# Patient Record
Sex: Female | Born: 1975 | Race: Black or African American | Hispanic: No | Marital: Single | Smoking: Former smoker
Health system: Southern US, Community
[De-identification: ages and names within clinical notes are randomized; demographics above are authoritative.]

## PROBLEM LIST (undated history)

## (undated) DIAGNOSIS — K219 Gastro-esophageal reflux disease without esophagitis: Secondary | ICD-10-CM

## (undated) DIAGNOSIS — D259 Leiomyoma of uterus, unspecified: Secondary | ICD-10-CM

## (undated) HISTORY — PX: NO PAST SURGERIES: SHX2092

---

## 2001-03-07 ENCOUNTER — Encounter: Payer: Self-pay | Admitting: Family Medicine

## 2001-03-07 ENCOUNTER — Encounter: Admission: RE | Admit: 2001-03-07 | Discharge: 2001-03-07 | Payer: Self-pay | Admitting: Family Medicine

## 2005-07-19 ENCOUNTER — Emergency Department (HOSPITAL_COMMUNITY): Admission: EM | Admit: 2005-07-19 | Discharge: 2005-07-19 | Payer: Self-pay | Admitting: Emergency Medicine

## 2005-08-21 ENCOUNTER — Emergency Department (HOSPITAL_COMMUNITY): Admission: EM | Admit: 2005-08-21 | Discharge: 2005-08-21 | Payer: Self-pay | Admitting: Emergency Medicine

## 2010-12-28 ENCOUNTER — Ambulatory Visit
Admission: RE | Admit: 2010-12-28 | Discharge: 2010-12-28 | Disposition: A | Discharge status: Home / Self Care | Payer: Medicaid Other | Source: Ambulatory Visit | Attending: Nephrology | Admitting: Nephrology

## 2010-12-28 ENCOUNTER — Other Ambulatory Visit: Payer: Self-pay | Admitting: Nephrology

## 2010-12-28 DIAGNOSIS — F17200 Nicotine dependence, unspecified, uncomplicated: Secondary | ICD-10-CM

## 2010-12-31 ENCOUNTER — Other Ambulatory Visit (HOSPITAL_COMMUNITY): Payer: Self-pay | Admitting: Nephrology

## 2010-12-31 DIAGNOSIS — R51 Headache: Secondary | ICD-10-CM

## 2011-01-05 ENCOUNTER — Inpatient Hospital Stay (HOSPITAL_COMMUNITY): Admission: RE | Admit: 2011-01-05 | Payer: Medicaid Other | Source: Ambulatory Visit

## 2011-01-07 ENCOUNTER — Ambulatory Visit (HOSPITAL_COMMUNITY)
Admission: RE | Admit: 2011-01-07 | Discharge: 2011-01-07 | Disposition: A | Discharge status: Home / Self Care | Payer: Medicaid Other | Source: Ambulatory Visit | Attending: Nephrology | Admitting: Nephrology

## 2011-01-07 ENCOUNTER — Other Ambulatory Visit (HOSPITAL_COMMUNITY): Payer: Self-pay | Admitting: Nephrology

## 2011-01-07 DIAGNOSIS — F17200 Nicotine dependence, unspecified, uncomplicated: Secondary | ICD-10-CM | POA: Insufficient documentation

## 2011-01-07 DIAGNOSIS — R51 Headache: Secondary | ICD-10-CM

## 2011-05-04 HISTORY — PX: ABDOMINAL HYSTERECTOMY: SHX81

## 2011-09-21 ENCOUNTER — Other Ambulatory Visit: Payer: Self-pay | Admitting: Family Medicine

## 2011-09-21 DIAGNOSIS — Z803 Family history of malignant neoplasm of breast: Secondary | ICD-10-CM

## 2011-09-21 DIAGNOSIS — Z1231 Encounter for screening mammogram for malignant neoplasm of breast: Secondary | ICD-10-CM

## 2011-10-07 ENCOUNTER — Ambulatory Visit: Payer: Medicaid Other

## 2011-12-09 ENCOUNTER — Encounter (HOSPITAL_COMMUNITY): Payer: Self-pay

## 2011-12-09 ENCOUNTER — Emergency Department (HOSPITAL_COMMUNITY): Payer: Medicaid Other

## 2011-12-09 ENCOUNTER — Emergency Department (HOSPITAL_COMMUNITY)
Admission: EM | Admit: 2011-12-09 | Discharge: 2011-12-09 | Disposition: A | Discharge status: Home / Self Care | Payer: Medicaid Other | Attending: Emergency Medicine | Admitting: Emergency Medicine

## 2011-12-09 DIAGNOSIS — F17200 Nicotine dependence, unspecified, uncomplicated: Secondary | ICD-10-CM | POA: Insufficient documentation

## 2011-12-09 DIAGNOSIS — R109 Unspecified abdominal pain: Secondary | ICD-10-CM | POA: Insufficient documentation

## 2011-12-09 DIAGNOSIS — D251 Intramural leiomyoma of uterus: Secondary | ICD-10-CM

## 2011-12-09 LAB — CBC WITH DIFFERENTIAL/PLATELET
Basophils Absolute: 0 10*3/uL (ref 0.0–0.1)
Basophils Relative: 0 % (ref 0–1)
Eosinophils Absolute: 0.1 10*3/uL (ref 0.0–0.7)
Eosinophils Relative: 0 % (ref 0–5)
HCT: 37.4 % (ref 36.0–46.0)
Hemoglobin: 12.6 g/dL (ref 12.0–15.0)
Lymphocytes Relative: 16 % (ref 12–46)
Lymphs Abs: 2.9 10*3/uL (ref 0.7–4.0)
MCH: 26.3 pg (ref 26.0–34.0)
MCHC: 33.7 g/dL (ref 30.0–36.0)
MCV: 77.9 fL — ABNORMAL LOW (ref 78.0–100.0)
Monocytes Absolute: 1.1 10*3/uL — ABNORMAL HIGH (ref 0.1–1.0)
Monocytes Relative: 6 % (ref 3–12)
Neutro Abs: 14.2 10*3/uL — ABNORMAL HIGH (ref 1.7–7.7)
Neutrophils Relative %: 78 % — ABNORMAL HIGH (ref 43–77)
Platelets: 344 10*3/uL (ref 150–400)
RBC: 4.8 MIL/uL (ref 3.87–5.11)
RDW: 13.8 % (ref 11.5–15.5)
WBC: 18.2 10*3/uL — ABNORMAL HIGH (ref 4.0–10.5)

## 2011-12-09 LAB — COMPREHENSIVE METABOLIC PANEL
ALT: 18 U/L (ref 0–35)
AST: 18 U/L (ref 0–37)
Albumin: 3.2 g/dL — ABNORMAL LOW (ref 3.5–5.2)
Alkaline Phosphatase: 98 U/L (ref 39–117)
BUN: 13 mg/dL (ref 6–23)
CO2: 24 mEq/L (ref 19–32)
Calcium: 8.7 mg/dL (ref 8.4–10.5)
Chloride: 105 mEq/L (ref 96–112)
Creatinine, Ser: 0.77 mg/dL (ref 0.50–1.10)
GFR calc Af Amer: >90 mL/min (ref >90)
GFR calc non Af Amer: >90 mL/min (ref >90)
Glucose, Bld: 100 mg/dL — ABNORMAL HIGH (ref 70–99)
Potassium: 4.3 mEq/L (ref 3.5–5.1)
Sodium: 137 mEq/L (ref 135–145)
Total Bilirubin: 0.3 mg/dL (ref 0.3–1.2)
Total Protein: 7.8 g/dL (ref 6.0–8.3)

## 2011-12-09 LAB — URINALYSIS, ROUTINE W REFLEX MICROSCOPIC
Bilirubin Urine: NEGATIVE
Glucose, UA: NEGATIVE mg/dL
Hgb urine dipstick: NEGATIVE
Ketones, ur: NEGATIVE mg/dL
Nitrite: NEGATIVE
Protein, ur: NEGATIVE mg/dL
Specific Gravity, Urine: 1.025 (ref 1.005–1.030)
Urobilinogen, UA: 0.2 mg/dL (ref 0.0–1.0)
pH: 5.5 (ref 5.0–8.0)

## 2011-12-09 LAB — WET PREP, GENITAL
Trich, Wet Prep: NONE SEEN
Yeast Wet Prep HPF POC: NONE SEEN

## 2011-12-09 LAB — URINE MICROSCOPIC-ADD ON

## 2011-12-09 LAB — GC/CHLAMYDIA PROBE AMP, GENITAL
Chlamydia, DNA Probe: NEGATIVE
GC Probe Amp, Genital: NEGATIVE

## 2011-12-09 LAB — POCT PREGNANCY, URINE: Preg Test, Ur: NEGATIVE

## 2011-12-09 MED ORDER — ONDANSETRON HCL 4 MG/2ML IJ SOLN
4.0000 mg | Freq: Once | INTRAMUSCULAR | Status: AC
  Administered 2011-12-09: 4 mg via INTRAVENOUS
  Filled 2011-12-09: qty 2

## 2011-12-09 MED ORDER — ONDANSETRON HCL 4 MG/2ML IJ SOLN
4.0000 mg | Freq: Once | INTRAMUSCULAR | Status: AC
  Administered 2011-12-09: 4 mg via INTRAVENOUS

## 2011-12-09 MED ORDER — MORPHINE SULFATE 4 MG/ML IJ SOLN
4.0000 mg | Freq: Once | INTRAMUSCULAR | Status: AC
  Administered 2011-12-09: 4 mg via INTRAVENOUS
  Filled 2011-12-09: qty 1

## 2011-12-09 MED ORDER — ONDANSETRON HCL 4 MG/2ML IJ SOLN
INTRAMUSCULAR | Status: AC
  Filled 2011-12-09: qty 2

## 2011-12-09 MED ORDER — HYDROCODONE-ACETAMINOPHEN 5-325 MG PO TABS: 2.0000 | ORAL_TABLET | ORAL | Status: AC | PRN

## 2011-12-09 NOTE — ED Notes (Signed)
Pt reports lower abd pain x4 days, walking relieves the pain, lying or sitting increases pain, burning w/urination starting last night, pt denies abnormal vaginal odor or d/c

## 2011-12-09 NOTE — ED Provider Notes (Signed)
History     CSN: [id number]  Arrival date & time 12/09/11  4098   First MD Initiated Contact with Patient 12/09/11 1029      Chief Complaint  Patient presents with  . Abdominal Pain    (Consider location/radiation/quality/duration/timing/severity/associated sxs/prior treatment) HPI Comments: Patient presents today with a chief complaint of abdominal pain.  Pain located across across her lower abdomen.  Pain does not radiate.  She reports that the pain has been intermittent over the past 4 days, but has been constant since last evening.  She describes the pain as sharp.  Pain worse in the suprapubic region.  She denies any fever or chills.  Denies nausea, vomiting, or diarrhea.  She denies constipation.  Last BM was last evening and was soft and normal.  No blood in her stool.  She is having some dysuria, but denies hematuria.  No previous abdominal surgeries.    The history is provided by the patient.    History reviewed. No pertinent past medical history.  History reviewed. No pertinent past surgical history.  History reviewed. No pertinent family history.  History  Substance Use Topics  . Smoking status: Current Some Day Smoker  . Smokeless tobacco: Not on file  . Alcohol Use: Yes    OB History    Grav Para Term Preterm Abortions TAB SAB Ect Mult Living                  Review of Systems  Constitutional: Negative for fever and chills.  Respiratory: Negative for shortness of breath.   Cardiovascular: Negative for chest pain.  Gastrointestinal: Positive for abdominal pain. Negative for nausea, vomiting, diarrhea, constipation, blood in stool and abdominal distention.  Genitourinary: Positive for dysuria. Negative for urgency, frequency, hematuria, vaginal bleeding and vaginal discharge.    Allergies  Review of patient's allergies indicates no known allergies.  Home Medications  No current outpatient prescriptions on file.  BP 105/78  Pulse 91  Temp 98.6 F (37  C) (Oral)  Resp 18  SpO2 100%  LMP 11/15/2011  Physical Exam  Nursing note and vitals reviewed. Constitutional: She appears well-developed and well-nourished. No distress.  HENT:  Head: Normocephalic and atraumatic.  Mouth/Throat: Oropharynx is clear and moist.  Cardiovascular: Normal rate, regular rhythm and normal heart sounds.   Pulmonary/Chest: Effort normal and breath sounds normal.  Abdominal: Soft. Bowel sounds are normal. She exhibits no distension and no mass. There is tenderness in the right lower quadrant, suprapubic area and left lower quadrant. There is no rebound, no guarding and negative Murphy's sign.  Genitourinary: Vagina normal and uterus normal. There is no rash, tenderness or lesion on the right labia. There is no rash, tenderness or lesion on the left labia. Cervix exhibits no motion tenderness. Right adnexum displays no mass, no tenderness and no fullness. Left adnexum displays tenderness. Left adnexum displays no mass and no fullness.  Musculoskeletal: Normal range of motion.  Neurological: She is alert.  Skin: Skin is warm and dry. She is not diaphoretic.  Psychiatric: She has a normal mood and affect.    ED Course  Procedures (including critical care time)  Labs Reviewed  URINALYSIS, ROUTINE W REFLEX MICROSCOPIC - Abnormal; Notable for the following:    Leukocytes, UA SMALL (*)     All other components within normal limits  CBC WITH DIFFERENTIAL - Abnormal; Notable for the following:    WBC 18.2 (*)     MCV 77.9 (*)     Neutrophils  Relative 78 (*)     Neutro Abs 14.2 (*)     Monocytes Absolute 1.1 (*)     All other components within normal limits  COMPREHENSIVE METABOLIC PANEL - Abnormal; Notable for the following:    Glucose, Bld 100 (*)     Albumin 3.2 (*)     All other components within normal limits  URINE MICROSCOPIC-ADD ON - Abnormal; Notable for the following:    Squamous Epithelial / LPF FEW (*)     All other components within normal limits    POCT PREGNANCY, URINE   No results found.   No diagnosis found.    MDM  Patient presenting with a chief complaint of abdominal pain.  Pain predominantly located in the suprapubic area.  Patient with left adnexal tenderness on pelvic exam.  Therefore, pelvic ultrasound was ordered.  Patient moved to the CDU pending pelvic ultrasound.  Patient signed out to [facility], PA-C who will follow up on results.  Pain has been present for 4 days.  No fever or chills.  No rebound or guarding on abdominal exam.  Therefore, doubt surgical abdomen and do not think that a CT is indicated at this time.           Pascal Lux [place], PA-C 12/09/11 1727

## 2011-12-09 NOTE — ED Provider Notes (Signed)
2:01 PM Patient's abdominal and transvaginal ultrasound reveal no acute processes. She can be discharged with a follow up for OBGYN to manage uterine fibroids.   Emilia Beck, PA-C 12/09/11 1402

## 2011-12-09 NOTE — ED Notes (Signed)
Pt reports LMP was abnormal w/intermittent days of "heavy bleeding and no bleeding"

## 2011-12-09 NOTE — ED Notes (Signed)
Pt given warm blanket & updated on delays.

## 2011-12-10 NOTE — ED Provider Notes (Signed)
Medical screening examination/treatment/procedure(s) were performed by non-physician practitioner and as supervising physician I was immediately available for consultation/collaboration.  Ozias Dicenzo L Donya Hitch, MD 12/10/11 0822 

## 2011-12-10 NOTE — ED Provider Notes (Signed)
Medical screening examination/treatment/procedure(s) were performed by non-physician practitioner and as supervising physician I was immediately available for consultation/collaboration.  Flint Melter, MD 12/10/11 [phone]

## 2011-12-27 ENCOUNTER — Other Ambulatory Visit: Payer: Self-pay | Admitting: Obstetrics & Gynecology

## 2012-01-18 ENCOUNTER — Encounter (HOSPITAL_COMMUNITY): Payer: Self-pay | Admitting: Pharmacy Technician

## 2012-01-24 ENCOUNTER — Encounter (HOSPITAL_COMMUNITY): Payer: Self-pay

## 2012-01-24 ENCOUNTER — Encounter (HOSPITAL_COMMUNITY)
Admission: RE | Admit: 2012-01-24 | Discharge: 2012-01-24 | Disposition: A | Discharge status: Home / Self Care | Payer: Medicaid Other | Source: Ambulatory Visit | Attending: Obstetrics & Gynecology | Admitting: Obstetrics & Gynecology

## 2012-01-24 HISTORY — DX: Leiomyoma of uterus, unspecified: D25.9

## 2012-01-24 LAB — CBC
HCT: 37.2 % (ref 36.0–46.0)
Hemoglobin: 12.4 g/dL (ref 12.0–15.0)
MCH: 26.1 pg (ref 26.0–34.0)
MCHC: 33.3 g/dL (ref 30.0–36.0)
MCV: 78.2 fL (ref 78.0–100.0)
Platelets: 414 10*3/uL — ABNORMAL HIGH (ref 150–400)
RBC: 4.76 MIL/uL (ref 3.87–5.11)
RDW: 14.3 % (ref 11.5–15.5)
WBC: 13.4 10*3/uL — ABNORMAL HIGH (ref 4.0–10.5)

## 2012-01-24 LAB — PREGNANCY, URINE: Preg Test, Ur: NEGATIVE

## 2012-01-24 LAB — SURGICAL PCR SCREEN
MRSA, PCR: NEGATIVE
Staphylococcus aureus: NEGATIVE

## 2012-01-24 NOTE — Patient Instructions (Signed)
20 [name]  01/24/2012   Your procedure is scheduled on:  01-28-2012  Report to [facility] at 0530 AM.  Call this number if you have problems the morning of surgery: [phone]   Remember:   Do not eat food or drink liquids:After Midnight.  .  Take these medicines the morning of surgery with A SIP OF WATER: no meds to take   Do not wear jewelry or make up.  Do not wear lotions, powders, or perfumes.Do not wear deodorant.    Do not bring valuables to the hospital.  Contacts, dentures or bridgework may not be worn into surgery.  Leave suitcase in the car. After surgery it may be brought to your room.  For patients admitted to the hospital, checkout time is 11:00 AM the day of discharge                             Patients discharged the day of surgery will not be allowed to drive home. If going home same day of surgery, you must have someone stay with you the first 24 hours at home and arrange for some one to drive you home from hospital.    Special Instructions: See [facility] Preparing for Surgery instruction sheet. Women do not shave legs or underarms for 12 hours before showers. Men may shave face morning of surgery.    Please read over the following fact sheets that you were given: MRSA Information, blood fact sheet  [facility] pre op nurse phone number [phone], call if needed

## 2012-01-26 ENCOUNTER — Encounter (HOSPITAL_COMMUNITY): Payer: Self-pay | Admitting: Obstetrics & Gynecology

## 2012-01-26 DIAGNOSIS — N920 Excessive and frequent menstruation with regular cycle: Secondary | ICD-10-CM | POA: Diagnosis present

## 2012-01-26 DIAGNOSIS — N946 Dysmenorrhea, unspecified: Principal | ICD-10-CM | POA: Diagnosis present

## 2012-01-26 NOTE — H&P (Signed)
  Subjective:  Madison [name] is a 36 y.o.  female.  She reports recent onset of abnormal uterine bleeding.  She has a long history of dysmenorrhea refractory to medical management.   Evaluation to date: pelvic ultrasound: positive for a small, uterine fibroid.   Pertinent Gyn History:  Menses see above Bleeding: see above  Last pap: normal   Patient Active Problem List   Diagnosis Date Noted  . Dysmenorrhea 01/26/2012  . Excessive or frequent menstruation 01/26/2012   Past Medical History  Diagnosis Date  . Uterine fibroid     Past Surgical History  Procedure Date  . No past surgeries     No prescriptions prior to admission   No Known Allergies  History  Substance Use Topics  . Smoking status: Current Some Day Smoker -- 1.0 packs/day for 22 years    Types: Cigarettes  . Smokeless tobacco: Never Used  . Alcohol Use: Yes     none recent    History reviewed. No pertinent family history.   Review of Systems Pertinent items are noted in HPI.    Objective:   Vital signs in last 24 hours:    General:   alert  Skin:   normal  HEENT:  PERRLA  Lungs:   clear to auscultation bilaterally  Heart:   regular rate and rhythm, S1, S2 normal, no murmur, click, rub or gallop  Breasts:   normal without suspicious masses, skin or nipple changes or axillary nodes  Abdomen:  soft, non-tender; bowel sounds normal; no masses,  no organomegaly  Pelvis:  Vulva and vagina appear normal. Bimanual exam reveals normal uterus and adnexa.                                     Assessment/Plan: Dysmenorrhea, low suspicion for endometriosis, likely primary Recent episode of AUB;  Likely AUB-O    I had a lengthy discussion with the patient regarding alternatives to hysterectomy.  Procedure, risks, reasons, benefits and complications (including injury to bowel, bladder, major blood vessel, ureter, bleeding, possibility of transfusion, infection, or fistula formation) were reviewed in  detail. Consent was signed and preop testing was ordered.  Instructions were reviewed, including NPO after midnight.

## 2012-01-28 ENCOUNTER — Inpatient Hospital Stay: Admit: 2012-01-28 | Payer: Self-pay | Admitting: Obstetrics & Gynecology

## 2012-01-28 ENCOUNTER — Encounter (HOSPITAL_COMMUNITY): Payer: Self-pay | Admitting: Anesthesiology

## 2012-01-28 ENCOUNTER — Ambulatory Visit (HOSPITAL_COMMUNITY): Payer: Medicaid Other | Admitting: Anesthesiology

## 2012-01-28 ENCOUNTER — Encounter (HOSPITAL_COMMUNITY)
Admission: RE | Disposition: A | Discharge status: Home / Self Care | Payer: Self-pay | Source: Ambulatory Visit | Attending: Obstetrics & Gynecology

## 2012-01-28 ENCOUNTER — Inpatient Hospital Stay (HOSPITAL_COMMUNITY)
Admission: RE | Admit: 2012-01-28 | Discharge: 2012-01-30 | DRG: 743 | Disposition: A | Discharge status: Home / Self Care | Payer: Medicaid Other | Source: Ambulatory Visit | Attending: Obstetrics & Gynecology | Admitting: Obstetrics & Gynecology

## 2012-01-28 ENCOUNTER — Encounter (HOSPITAL_COMMUNITY): Payer: Self-pay | Admitting: *Deleted

## 2012-01-28 DIAGNOSIS — Z01812 Encounter for preprocedural laboratory examination: Secondary | ICD-10-CM

## 2012-01-28 DIAGNOSIS — D259 Leiomyoma of uterus, unspecified: Secondary | ICD-10-CM | POA: Diagnosis present

## 2012-01-28 DIAGNOSIS — F17200 Nicotine dependence, unspecified, uncomplicated: Secondary | ICD-10-CM | POA: Diagnosis present

## 2012-01-28 DIAGNOSIS — N736 Female pelvic peritoneal adhesions (postinfective): Secondary | ICD-10-CM | POA: Diagnosis present

## 2012-01-28 DIAGNOSIS — N920 Excessive and frequent menstruation with regular cycle: Secondary | ICD-10-CM | POA: Diagnosis present

## 2012-01-28 DIAGNOSIS — Z6839 Body mass index (BMI) 39.0-39.9, adult: Secondary | ICD-10-CM

## 2012-01-28 DIAGNOSIS — N946 Dysmenorrhea, unspecified: Principal | ICD-10-CM | POA: Diagnosis present

## 2012-01-28 LAB — BASIC METABOLIC PANEL
BUN: 13 mg/dL (ref 6–23)
CO2: 26 mEq/L (ref 19–32)
Calcium: 8.7 mg/dL (ref 8.4–10.5)
Chloride: 101 mEq/L (ref 96–112)
Creatinine, Ser: 0.89 mg/dL (ref 0.50–1.10)
GFR calc Af Amer: >90 mL/min (ref >90)
GFR calc non Af Amer: 82 mL/min — ABNORMAL LOW (ref >90)
Glucose, Bld: 121 mg/dL — ABNORMAL HIGH (ref 70–99)
Potassium: 4.3 mEq/L (ref 3.5–5.1)
Sodium: 135 mEq/L (ref 135–145)

## 2012-01-28 LAB — TYPE AND SCREEN
ABO/RH(D): B POS
Antibody Screen: NEGATIVE

## 2012-01-28 LAB — ABO/RH: ABO/RH(D): B POS

## 2012-01-28 SURGERY — ROBOTIC ASSISTED TOTAL HYSTERECTOMY
Anesthesia: Choice

## 2012-01-28 SURGERY — ROBOTIC ASSISTED TOTAL HYSTERECTOMY WITH BILATERAL SALPINGO OOPHORECTOMY
Anesthesia: General | Site: Abdomen | Wound class: Clean Contaminated

## 2012-01-28 MED ORDER — ACETAMINOPHEN 10 MG/ML IV SOLN
INTRAVENOUS | Status: AC
  Filled 2012-01-28: qty 100

## 2012-01-28 MED ORDER — HYDROMORPHONE HCL PF 1 MG/ML IJ SOLN
INTRAMUSCULAR | Status: DC | PRN
  Administered 2012-01-28 (×2): 1 mg via INTRAVENOUS

## 2012-01-28 MED ORDER — HYDROMORPHONE HCL PF 1 MG/ML IJ SOLN
0.2500 mg | INTRAMUSCULAR | Status: DC | PRN
  Administered 2012-01-28: 0.5 mg via INTRAVENOUS

## 2012-01-28 MED ORDER — CEFAZOLIN SODIUM-DEXTROSE 2-3 GM-% IV SOLR
INTRAVENOUS | Status: AC
  Filled 2012-01-28: qty 50

## 2012-01-28 MED ORDER — LIDOCAINE HCL (CARDIAC) 20 MG/ML IV SOLN
INTRAVENOUS | Status: DC | PRN
  Administered 2012-01-28: 100 mg via INTRAVENOUS

## 2012-01-28 MED ORDER — KETOROLAC TROMETHAMINE 30 MG/ML IJ SOLN
15.0000 mg | Freq: Once | INTRAMUSCULAR | Status: AC | PRN
  Administered 2012-01-28: 30 mg via INTRAVENOUS

## 2012-01-28 MED ORDER — ONDANSETRON HCL 4 MG/2ML IJ SOLN
4.0000 mg | Freq: Four times a day (QID) | INTRAMUSCULAR | Status: DC | PRN
  Administered 2012-01-29 – 2012-01-30 (×2): 4 mg via INTRAVENOUS
  Filled 2012-01-28 (×2): qty 2

## 2012-01-28 MED ORDER — KETOROLAC TROMETHAMINE 30 MG/ML IJ SOLN: 30.0000 mg | Freq: Once | INTRAMUSCULAR | Status: DC

## 2012-01-28 MED ORDER — ONDANSETRON HCL 4 MG PO TABS: 4.0000 mg | ORAL_TABLET | Freq: Four times a day (QID) | ORAL | Status: DC | PRN

## 2012-01-28 MED ORDER — FENTANYL CITRATE 0.05 MG/ML IJ SOLN
INTRAMUSCULAR | Status: DC | PRN
  Administered 2012-01-28 (×6): 50 ug via INTRAVENOUS

## 2012-01-28 MED ORDER — OXYCODONE-ACETAMINOPHEN 5-325 MG PO TABS
1.0000 | ORAL_TABLET | ORAL | Status: DC | PRN
  Administered 2012-01-28 – 2012-01-29 (×4): 2 via ORAL
  Filled 2012-01-28 (×4): qty 2

## 2012-01-28 MED ORDER — ACETAMINOPHEN 10 MG/ML IV SOLN
INTRAVENOUS | Status: DC | PRN
  Administered 2012-01-28: 1000 mg via INTRAVENOUS

## 2012-01-28 MED ORDER — PANTOPRAZOLE SODIUM 40 MG PO TBEC
40.0000 mg | DELAYED_RELEASE_TABLET | Freq: Every day | ORAL | Status: DC
  Administered 2012-01-28 – 2012-01-30 (×3): 40 mg via ORAL
  Filled 2012-01-28 (×3): qty 1

## 2012-01-28 MED ORDER — KETOROLAC TROMETHAMINE 30 MG/ML IJ SOLN
30.0000 mg | Freq: Four times a day (QID) | INTRAMUSCULAR | Status: DC
  Administered 2012-01-28 – 2012-01-30 (×9): 30 mg via INTRAVENOUS
  Filled 2012-01-28 (×12): qty 1

## 2012-01-28 MED ORDER — MAGNESIUM HYDROXIDE 400 MG/5ML PO SUSP
30.0000 mL | Freq: Two times a day (BID) | ORAL | Status: DC
  Administered 2012-01-29: 30 mL via ORAL
  Filled 2012-01-28 (×3): qty 30

## 2012-01-28 MED ORDER — SUCCINYLCHOLINE CHLORIDE 20 MG/ML IJ SOLN
INTRAMUSCULAR | Status: DC | PRN
  Administered 2012-01-28: 100 mg via INTRAVENOUS

## 2012-01-28 MED ORDER — KETOROLAC TROMETHAMINE 30 MG/ML IJ SOLN
30.0000 mg | Freq: Four times a day (QID) | INTRAMUSCULAR | Status: DC
  Filled 2012-01-28 (×8): qty 1

## 2012-01-28 MED ORDER — PROMETHAZINE HCL 25 MG/ML IJ SOLN: 6.2500 mg | INTRAMUSCULAR | Status: DC | PRN

## 2012-01-28 MED ORDER — IBUPROFEN 800 MG PO TABS: 800.0000 mg | ORAL_TABLET | Freq: Three times a day (TID) | ORAL | Status: DC | PRN

## 2012-01-28 MED ORDER — NEOSTIGMINE METHYLSULFATE 1 MG/ML IJ SOLN
INTRAMUSCULAR | Status: DC | PRN
  Administered 2012-01-28: 2 mg via INTRAVENOUS

## 2012-01-28 MED ORDER — KETOROLAC TROMETHAMINE 30 MG/ML IJ SOLN
INTRAMUSCULAR | Status: AC
  Filled 2012-01-28: qty 1

## 2012-01-28 MED ORDER — DEXAMETHASONE SODIUM PHOSPHATE 10 MG/ML IJ SOLN
INTRAMUSCULAR | Status: DC | PRN
  Administered 2012-01-28: 10 mg via INTRAVENOUS

## 2012-01-28 MED ORDER — MENTHOL 3 MG MT LOZG
1.0000 | LOZENGE | OROMUCOSAL | Status: DC | PRN
  Filled 2012-01-28: qty 9

## 2012-01-28 MED ORDER — PROPOFOL 10 MG/ML IV EMUL
INTRAVENOUS | Status: DC | PRN
  Administered 2012-01-28: 200 mg via INTRAVENOUS

## 2012-01-28 MED ORDER — LACTATED RINGERS IV SOLN
INTRAVENOUS | Status: DC | PRN
  Administered 2012-01-28 (×2): via INTRAVENOUS

## 2012-01-28 MED ORDER — CEFAZOLIN SODIUM-DEXTROSE 2-3 GM-% IV SOLR
2.0000 g | INTRAVENOUS | Status: AC
  Administered 2012-01-28: 2 g via INTRAVENOUS

## 2012-01-28 MED ORDER — GLYCOPYRROLATE 0.2 MG/ML IJ SOLN
INTRAMUSCULAR | Status: DC | PRN
  Administered 2012-01-28: 0.4 mg via INTRAVENOUS

## 2012-01-28 MED ORDER — BUPIVACAINE HCL (PF) 0.25 % IJ SOLN
INTRAMUSCULAR | Status: AC
  Filled 2012-01-28: qty 30

## 2012-01-28 MED ORDER — SIMETHICONE 80 MG PO CHEW
80.0000 mg | CHEWABLE_TABLET | Freq: Four times a day (QID) | ORAL | Status: DC | PRN
  Administered 2012-01-29: 80 mg via ORAL
  Filled 2012-01-28 (×2): qty 1

## 2012-01-28 MED ORDER — ROCURONIUM BROMIDE 100 MG/10ML IV SOLN
INTRAVENOUS | Status: DC | PRN
  Administered 2012-01-28: 30 mg via INTRAVENOUS
  Administered 2012-01-28 (×3): 10 mg via INTRAVENOUS

## 2012-01-28 MED ORDER — MIDAZOLAM HCL 5 MG/5ML IJ SOLN
INTRAMUSCULAR | Status: DC | PRN
  Administered 2012-01-28: 1 mg via INTRAVENOUS
  Administered 2012-01-28: 2 mg via INTRAVENOUS

## 2012-01-28 MED ORDER — ONDANSETRON HCL 4 MG/2ML IJ SOLN
INTRAMUSCULAR | Status: DC | PRN
  Administered 2012-01-28: 4 mg via INTRAVENOUS

## 2012-01-28 MED ORDER — HYDROMORPHONE HCL PF 1 MG/ML IJ SOLN
INTRAMUSCULAR | Status: AC
  Filled 2012-01-28: qty 1

## 2012-01-28 MED ORDER — LACTATED RINGERS IV SOLN
INTRAVENOUS | Status: DC
  Administered 2012-01-28 – 2012-01-30 (×6): via INTRAVENOUS

## 2012-01-28 MED ORDER — METOCLOPRAMIDE HCL 5 MG/ML IJ SOLN
INTRAMUSCULAR | Status: DC | PRN
  Administered 2012-01-28: 10 mg via INTRAVENOUS

## 2012-01-28 MED ORDER — MORPHINE SULFATE 2 MG/ML IJ SOLN
1.0000 mg | INTRAMUSCULAR | Status: DC | PRN
  Administered 2012-01-28 – 2012-01-30 (×10): 2 mg via INTRAVENOUS
  Filled 2012-01-28 (×10): qty 1

## 2012-01-28 MED ORDER — ZOLPIDEM TARTRATE 5 MG PO TABS: 5.0000 mg | ORAL_TABLET | Freq: Every evening | ORAL | Status: DC | PRN

## 2012-01-28 MED ORDER — LACTATED RINGERS IR SOLN
Status: DC | PRN
  Administered 2012-01-28: 1000 mL

## 2012-01-28 SURGICAL SUPPLY — 66 items
BAG URINE DRAINAGE (UROLOGICAL SUPPLIES) IMPLANT
BARRIER ADHS 3X4 INTERCEED (GAUZE/BANDAGES/DRESSINGS) IMPLANT
BENZOIN TINCTURE PRP APPL 2/3 (GAUZE/BANDAGES/DRESSINGS) ×4 IMPLANT
BLADELESS LONG 8MM (BLADE) ×8 IMPLANT
CABLE HIGH FREQUENCY MONO STRZ (ELECTRODE) ×4 IMPLANT
CATH FOLEY 3WAY  5CC 16FR (CATHETERS)
CATH FOLEY 3WAY 5CC 16FR (CATHETERS) IMPLANT
CLOTH BEACON ORANGE TIMEOUT ST (SAFETY) ×4 IMPLANT
CORDS BIPOLAR (ELECTRODE) ×4 IMPLANT
COVER MAYO STAND STRL (DRAPES) ×4 IMPLANT
COVER TIP SHEARS 8 DVNC (MISCELLANEOUS) ×3 IMPLANT
COVER TIP SHEARS 8MM DA VINCI (MISCELLANEOUS) ×1
DECANTER SPIKE VIAL GLASS SM (MISCELLANEOUS) ×4 IMPLANT
DRAPE CAMERA CLOSED 9X96 (DRAPES) ×4 IMPLANT
DRAPE LG THREE QUARTER DISP (DRAPES) ×8 IMPLANT
DRAPE TABLE BACK 44X90 PK DISP (DRAPES) ×8 IMPLANT
DRAPE WARM FLUID 44X44 (DRAPE) ×8 IMPLANT
DRSG TEGADERM 2-3/8X2-3/4 SM (GAUZE/BANDAGES/DRESSINGS) ×16 IMPLANT
ELECT REM PT RETURN 9FT ADLT (ELECTROSURGICAL) ×4
ELECTRODE REM PT RTRN 9FT ADLT (ELECTROSURGICAL) ×3 IMPLANT
FILTER SMOKE EVAC (FILTER) IMPLANT
GAUZE VASELINE 3X9 (GAUZE/BANDAGES/DRESSINGS) IMPLANT
GLOVE BIO SURGEON STRL SZ 6.5 (GLOVE) ×12 IMPLANT
GLOVE BIO SURGEON STRL SZ8 (GLOVE) ×8 IMPLANT
GLOVE BIOGEL PI IND STRL 7.0 (GLOVE) ×6 IMPLANT
GLOVE BIOGEL PI INDICATOR 7.0 (GLOVE) ×2
GLOVE ECLIPSE 6.5 STRL STRAW (GLOVE) ×4 IMPLANT
GOWN PREVENTION PLUS LG XLONG (DISPOSABLE) ×12 IMPLANT
GOWN STRL REIN XL XLG (GOWN DISPOSABLE) ×8 IMPLANT
KIT ACCESSORY DA VINCI DISP (KITS)
KIT ACCESSORY DVNC DISP (KITS) IMPLANT
MANIPULATOR UTERINE 4.5 ZUMI (MISCELLANEOUS) ×8 IMPLANT
NEEDLE INSUFFLATION 14GA 120MM (NEEDLE) IMPLANT
NS IRRIG 1000ML POUR BTL (IV SOLUTION) IMPLANT
OCCLUDER COLPOPNEUMO (BALLOONS) ×4 IMPLANT
PACK LAPAROSCOPY W LONG (CUSTOM PROCEDURE TRAY) ×4 IMPLANT
PACK ROBOTIC CUSTOM GYN (CUSTOM PROCEDURE TRAY) IMPLANT
PLUG CATH AND CAP STER (CATHETERS) ×8 IMPLANT
SET IRRIG TUBING LAPAROSCOPIC (IRRIGATION / IRRIGATOR) ×4 IMPLANT
SHEET LAVH (DRAPES) ×4 IMPLANT
SLEEVE SURGEON STRL (DRAPES) ×4 IMPLANT
SOLUTION ELECTROLUBE (MISCELLANEOUS) ×4 IMPLANT
SPONGE LAP 18X18 X RAY DECT (DISPOSABLE) IMPLANT
STRIP CLOSURE SKIN 1/4X4 (GAUZE/BANDAGES/DRESSINGS) ×4 IMPLANT
SUT VIC AB 0 CT1 27 (SUTURE) ×1
SUT VIC AB 0 CT1 27XBRD ANTBC (SUTURE) ×3 IMPLANT
SUT VIC AB 4-0 PS2 18 (SUTURE) ×8 IMPLANT
SUT VICRYL 0 UR6 27IN ABS (SUTURE) ×4 IMPLANT
SYR 50ML LL SCALE MARK (SYRINGE) ×4 IMPLANT
SYSTEM CONVERTIBLE TROCAR (TROCAR) IMPLANT
TIP UTERINE 5.1X6CM LAV DISP (MISCELLANEOUS) IMPLANT
TIP UTERINE 6.7X10CM GRN DISP (MISCELLANEOUS) IMPLANT
TIP UTERINE 6.7X6CM WHT DISP (MISCELLANEOUS) IMPLANT
TIP UTERINE 6.7X8CM BLUE DISP (MISCELLANEOUS) IMPLANT
TOWEL OR 17X26 10 PK STRL BLUE (TOWEL DISPOSABLE) ×8 IMPLANT
TOWEL OR NON WOVEN STRL DISP B (DISPOSABLE) ×4 IMPLANT
TRAY FOLEY CATH 14FRSI W/METER (CATHETERS) ×4 IMPLANT
TROCAR 12M 150ML BLUNT (TROCAR) ×4 IMPLANT
TROCAR DISP BLADELESS 8 DVNC (TROCAR) IMPLANT
TROCAR DISP BLADELESS 8MM (TROCAR)
TROCAR Z-THREAD 12X150 (TROCAR) IMPLANT
TROCAR Z-THREAD OPTICAL 5X100M (TROCAR) IMPLANT
TUBING FILTER THERMOFLATOR (ELECTROSURGICAL) IMPLANT
TUBING INSUFFLATION 10FT LAP (TUBING) ×4 IMPLANT
WARMER LAPAROSCOPE (MISCELLANEOUS) ×4 IMPLANT
WATER STERILE IRR 1500ML POUR (IV SOLUTION) ×8 IMPLANT

## 2012-01-28 NOTE — Progress Notes (Signed)
Pt. Assisted to sit on side of bed for a few minutes tolerated poorly - refused to stand or ambulate

## 2012-01-28 NOTE — Preoperative (Signed)
Beta Blockers   Reason not to administer Beta Blockers:Not Applicable 

## 2012-01-28 NOTE — Transfer of Care (Signed)
Immediate Anesthesia Transfer of Care Note  Patient: Madison [name]  Procedure(s) Performed: Procedure(s) (LRB) with comments: ROBOTIC ASSISTED TOTAL HYSTERECTOMY WITH BILATERAL SALPINGO OOPHERECTOMY (Bilateral)  Patient Location: PACU  Anesthesia Type: General  Level of Consciousness: awake, oriented, pateint uncooperative, confused, lethargic and responds to stimulation  Airway & Oxygen Therapy: Patient Spontanous Breathing and Patient connected to face mask oxygen  Post-op Assessment: Report given to PACU RN, Post -op Vital signs reviewed and unstable, Anesthesiologist notified and Patient moving all extremities  Post vital signs: Reviewed and stable  Complications: No apparent anesthesia complications

## 2012-01-28 NOTE — Interval H&P Note (Signed)
History and Physical Interval Note:  01/28/2012 7:22 AM  Madison [name]  has presented today for surgery, with the diagnosis of Abnormal Uterine Bleeding/ uterine fibroids  The various methods of treatment have been discussed with the patient and family. After consideration of risks, benefits and other options for treatment, the patient has consented to  Procedure(s) (LRB) with comments: ROBOTIC ASSISTED TOTAL HYSTERECTOMY (N/[name]) BILATERAL SALPINGECTOMY (Bilateral) as [name] surgical intervention .  The patient's history has been reviewed, patient examined, no change in status, stable for surgery.  I have reviewed the patient's chart and labs.  Questions were answered to the patient's satisfaction.     Delsol-MOORE,Madison [name]

## 2012-01-28 NOTE — Anesthesia Preprocedure Evaluation (Signed)
Anesthesia Evaluation  Patient identified by MRN, date of birth, ID band Patient awake    Reviewed: Allergy & Precautions, H&P , NPO status , Patient's Chart, lab work & pertinent test results  Airway Mallampati: II TM Distance: <3 FB Neck ROM: Full    Dental No notable dental hx.    Pulmonary Current Smoker,  breath sounds clear to auscultation  + decreased breath sounds      Cardiovascular negative cardio ROS  Rhythm:Regular Rate:Normal     Neuro/Psych negative neurological ROS  negative psych ROS   GI/Hepatic negative GI ROS, Neg liver ROS,   Endo/Other  Morbid obesity  Renal/GU negative Renal ROS  negative genitourinary   Musculoskeletal negative musculoskeletal ROS (+)   Abdominal   Peds negative pediatric ROS (+)  Hematology negative hematology ROS (+)   Anesthesia Other Findings   Reproductive/Obstetrics negative OB ROS                           Anesthesia Physical Anesthesia Plan  ASA: III  Anesthesia Plan: General   Post-op Pain Management:    Induction: Intravenous  Airway Management Planned: Oral ETT  Additional Equipment:   Intra-op Plan:   Post-operative Plan: Extubation in OR  Informed Consent: I have reviewed the patients History and Physical, chart, labs and discussed the procedure including the risks, benefits and alternatives for the proposed anesthesia with the patient or authorized representative who has indicated his/her understanding and acceptance.   Dental advisory given  Plan Discussed with: CRNA and Surgeon  Anesthesia Plan Comments:         Anesthesia Quick Evaluation

## 2012-01-28 NOTE — Anesthesia Postprocedure Evaluation (Signed)
  Anesthesia Post-op Note  Patient: Madison [name]  Procedure(s) Performed: Procedure(s) (LRB): ROBOTIC ASSISTED TOTAL HYSTERECTOMY WITH BILATERAL SALPINGO OOPHERECTOMY (Bilateral)  Patient Location: PACU  Anesthesia Type: General  Level of Consciousness: awake and alert   Airway and Oxygen Therapy: Patient Spontanous Breathing  Post-op Pain: mild  Post-op Assessment: Post-op Vital signs reviewed, Patient's Cardiovascular Status Stable, Respiratory Function Stable, Patent Airway and No signs of Nausea or vomiting  Post-op Vital Signs: stable  Complications: No apparent anesthesia complications

## 2012-01-28 NOTE — Op Note (Signed)
Pre-operative Diagnosis: abnormal uterine bleeding and fibroids  Post-operative Diagnosis: same, pelvic adhesions  Operation: Robotic-assisted hysterectomy with bilateral salpingectomy, lysis of adhesions  Surgeon: Roseanna Rainbow  Assistant: Coral Ceo, MD  Anesthesia: GET  Urine Output:  Per Anesthesiology  Findings: Omental, large bowel adhesions to the parieto peritoneum, anterior abdominal wall.  Distal fallopian tube adhesions to the posterior uterine fundus.  Normal ovaries, uterus.  Estimated Blood Loss:  less than 100 mL                 Total IV Fluids: per Anesthesiology          Specimens: PATHOLOGY               Complications:  None; patient tolerated the procedure well.         Disposition: PACU - hemodynamically stable.         Condition: Stable    Procedure Details  The patient was seen in the Holding Room. The risks, benefits, complications, treatment options, and expected outcomes were discussed with the patient.  The patient concurred with the proposed plan, giving informed consent.  The site of surgery properly noted/marked. The patient was identified as Madison [name] and the procedure verified as a Robotic-assisted hysterectomy with bilateral salpingectomy. A Time Out was held and the above information confirmed.  After induction of anesthesia, the patient was draped and prepped in the usual sterile manner. Pt was placed in supine position after anesthesia and draped and prepped in the usual sterile manner. The abdominal drape was placed after the CholoraPrep had been allowed to dry for 3 minutes.  Her arms were tucked to her side with all appropriate precautions.  The shoulder blocks were placed in the usual fashion.  The patient was placed in the semi-lithotomy position in Ventura stirrups.  The perineum was prepped with Betadine.  Foley catheter was placed.  A sterile speculum was placed in the vagina.  The cervix was grasped with a  single-tooth tenaculum and dilated with Shawnie Pons dilators.  The ZUMI uterine manipulator with a medium colpotomizer ring was placed without difficulty.  A pneum occluder balloon was placed over the manipulator.  A second time-out was performed.  OG tube placement was confirmed and to suction.  Approximately 2 cm below the costal margin, in the midclavicular line the skin was anesthestized with 0.25% Marcaine.  A 5 mm incision was made and using a 5 mm Optiview, a 5 mm trocar was placed under direct vision.  The patient's abdomen was insufflated with CO2 gas.  At this point and all points during the procedure, the patient's intra-abdominal pressure did not exceed 15 mmHg.  A 10-12 camera port was place 24 cm above the pubic symphysis.  Bilateral 8 mm ports were place 10 cm and 15 degrees inferior.  All ports were placed under direct visualization.  The 5 mm port was removed.  The incision was extended to accommodate a 10 mm trocar.  A 10 mm trocar and sleeve were advanced under direct visualization.   The robot was docked in the usual fashion.  The above noted adhesions were divided with monopolar cautery.  The round ligament on the right was transected with monopolar cautery.  The anterior and posterior leaves of the broad ligament were opened.  The ureter was identified.  A window was made between the infundibulopelvic ligament and the ureter.  The fallopian tube/utero ovarian ligament were coagulated with bipolar cautery and transected.  The uterine vessels were  skeletonized to the level of the Koh ring.  A C-loop was created in the usual fashion.  A similar procedure was performed on the patient's left side. The round ligament on the left was transected with monopolar cautery.  The anterior and posterior leaves of the broad ligament were opened.  The ureter was identified.  A window was made between the infundibulopelvic ligament and the ureter.  The fallopian tube/utero ovarian ligament were coagulated with bipolar  cautery and transected.  The uterine vessels were skeletonized to the level of the Koh ring.  A C-loop was created in the usual fashion.   The bladder flap was completed.  At this time, the pneum occluder balloon was insufflated and a colpotomy was performed.  The uterus and cervix were delivered through the vagina.  All pedicles were inspected under low intraabdominal pressures and noted to be hemostatic.  The vagina cuff was closed using 0-Vicryl on a CT-1 needle in a running fashion.  The needle was removed without difficulty.  The abdomen and pelvis were copiously irrigated.  The mesosalpinx on the left was coagulated with bipolar cautery and transected.  The fallopian tube was removed from the abdomen.  The right fallopian tube was manipulated in a similar fashion.    The instruments were then removed under direct visualization and the robotic ports removed.  The robot was undocked.  Deep, subcutaneous, figure-of-eight 0-Vicryl sutures on a UR-6 needle were placed in the 10-12 mm supraumbilical and infracostal incisions.  All skin incisions were closed in a subcuticular fashion using 3-0 Monocryl.  Steri-strips and Benzoin were applied.  The vagina was swabbed with minimal bleeding noted.   All instrument and needle counts were correct x  2.

## 2012-01-29 LAB — CBC
HCT: 35.5 % — ABNORMAL LOW (ref 36.0–46.0)
Hemoglobin: 11.8 g/dL — ABNORMAL LOW (ref 12.0–15.0)
MCH: 25.9 pg — ABNORMAL LOW (ref 26.0–34.0)
MCHC: 33.2 g/dL (ref 30.0–36.0)
MCV: 77.9 fL — ABNORMAL LOW (ref 78.0–100.0)
Platelets: 364 10*3/uL (ref 150–400)
RBC: 4.56 MIL/uL (ref 3.87–5.11)
RDW: 14.4 % (ref 11.5–15.5)
WBC: 24.4 10*3/uL — ABNORMAL HIGH (ref 4.0–10.5)

## 2012-01-29 MED ORDER — DEXTROSE 5 % IV SOLN
2.0000 g | Freq: Four times a day (QID) | INTRAVENOUS | Status: DC
  Administered 2012-01-29 – 2012-01-30 (×6): 2 g via INTRAVENOUS
  Filled 2012-01-29 (×8): qty 2

## 2012-01-29 MED ORDER — HYDROMORPHONE HCL 4 MG PO TABS
4.0000 mg | ORAL_TABLET | ORAL | Status: DC | PRN
  Administered 2012-01-29 – 2012-01-30 (×6): 4 mg via ORAL
  Filled 2012-01-29 (×6): qty 1

## 2012-01-29 MED ORDER — DOXYCYCLINE HYCLATE 100 MG PO TABS
100.0000 mg | ORAL_TABLET | Freq: Two times a day (BID) | ORAL | Status: DC
  Administered 2012-01-29 – 2012-01-30 (×3): 100 mg via ORAL
  Filled 2012-01-29 (×4): qty 1

## 2012-01-29 NOTE — Progress Notes (Signed)
Spoke with Dr Clearance Coots at 1155 he advised to d/c foley to encourage patient in mobilation. Pt due to void by Josephina Gip, RN

## 2012-01-29 NOTE — Progress Notes (Signed)
Dr. Clearance Coots called to check on status of patient. Patient requiring po & IV pain medicine for pain control. Ambulation slow. May leave foley in until patient more ambulatory. WBC level of 24.4 reported to Dr. Clearance Coots per this nurse. T.O.V. For Mefoxin 2gm IV every 6 hours & Doxycycline 100mg  po twice a day.

## 2012-01-29 NOTE — Progress Notes (Signed)
1 Day Post-Op Procedure(s) (LRB): ROBOTIC ASSISTED TOTAL HYSTERECTOMY WITH BILATERAL SALPINGECTOMY  Subjective: Patient reports incisional pain.    Objective: I have reviewed patient's vital signs, intake and output, medications and labs.  General: alert and no distress Resp: clear to auscultation bilaterally Cardio: regular rate and rhythm, S1, S2 normal, no murmur, click, rub or gallop GI: normal findings: Incision ports clean. Extremities: extremities normal, atraumatic, no cyanosis or edema Vaginal Bleeding: none  Labs:  WBC = 24,000      HGB = 11.3 Assessment: s/p Procedure(s) (LRB) with comments: ROBOTIC ASSISTED TOTAL HYSTERECTOMY WITH BILATERAL SALPINGO OOPHERECTOMY (Bilateral): stable, tolerating diet and increased pain and slow ambulation.  Leukocytosis post op.  Probable some residual PID.  Will need to start treatment of PID until WBC normalizes.  Plan: Advance diet Encourage ambulation Continue foley due to slow to ambulate due to pain. Start IV antibiotics.  Patient probably had PID prior to surgery.  LOS: 1 day    HARPER,CHARLES A 01/29/2012, 6:02 AM

## 2012-01-29 NOTE — Progress Notes (Signed)
Pt foley taken out at 12pm total output at this point bladder scanned only 32ml in. Permelia Bamba RN

## 2012-01-30 LAB — CBC
HCT: 32.3 % — ABNORMAL LOW (ref 36.0–46.0)
Hemoglobin: 10.6 g/dL — ABNORMAL LOW (ref 12.0–15.0)
MCH: 25.9 pg — ABNORMAL LOW (ref 26.0–34.0)
MCHC: 32.8 g/dL (ref 30.0–36.0)
MCV: 78.8 fL (ref 78.0–100.0)
Platelets: 313 10*3/uL (ref 150–400)
RBC: 4.1 MIL/uL (ref 3.87–5.11)
RDW: 14.3 % (ref 11.5–15.5)
WBC: 15.2 10*3/uL — ABNORMAL HIGH (ref 4.0–10.5)

## 2012-01-30 NOTE — Progress Notes (Signed)
Pt discharged to home with mom and sister  provided discharge instructions and prescriptions along with handouts. Pt verbalized understanding of discharge information. Pt stable. Pt transported by justin IV removed and documented. Annitta Needs, RN

## 2012-01-31 NOTE — Care Management Note (Signed)
    Page 1 of 1   01/31/2012     11:52:31 AM   CARE MANAGEMENT NOTE 01/31/2012  Patient:  Madison [name], Madison [name]   Account Number:  [id number]  Date Initiated:  01/31/2012  Documentation initiated by:  Lorenda Ishihara  Subjective/Objective Assessment:   36 yo female admitted s/p robotic hysterectomy with lysis of adhesions.     Action/Plan:   Anticipated DC Date:  01/30/2012   Anticipated DC Plan:  HOME/SELF CARE      DC Planning Services  CM consult      Choice offered to / List presented to:             Status of service:  Completed, signed off Medicare Important Message given?   (If response is "NO", the following Medicare IM given date fields will be blank) Date Medicare IM given:   Date Additional Medicare IM given:    Discharge Disposition:  HOME/SELF CARE  Per UR Regulation:  Reviewed for med. necessity/level of care/duration of stay  If discussed at Long Length of Stay Meetings, dates discussed:    Comments:

## 2012-02-05 NOTE — Discharge Summary (Signed)
Physician Discharge Summary  Patient ID: Madison [name] MRN: [id number] DOB/AGE: 1975-11-16 36 y.o.  Admit date: 01/28/2012 Discharge date: 02/05/2012  Admission Diagnoses:  Active Problems:  Dysmenorrhea  Excessive or frequent menstruation  Discharge Diagnoses:  Active Problems:  Dysmenorrhea  Excessive or frequent menstruation   Discharged Condition: stable  Hospital Course: On 01/28/2012, the patient underwent the following: Procedure(s): ROBOTIC ASSISTED TOTAL HYSTERECTOMY WITH BILATERAL SALPINGO OOPHERECTOMY.   The postoperative course was uneventful.  She was discharged to home on postoperative day 2 tolerating a regular diet.  Consults: None  Significant Diagnostic Studies: none  Treatments: surgery: see above  Discharge Exam: Blood pressure 133/94, pulse 94, temperature 97.9 F (36.6 C), temperature source Oral, resp. rate 18, height 5\' 3"  (1.6 m), weight 100.245 kg (221 lb), SpO2 98.00%. General appearance: alert GI: soft, non-tender; bowel sounds normal; no masses,  no organomegaly Extremities: extremities normal, atraumatic, no cyanosis or edema Incision/Wound:  C/D/I  Disposition: 01-Home or Self Care  Discharge Orders    Future Orders Please Complete By Expires   Diet - low sodium heart healthy      Increase activity slowly      May walk up steps      May shower / Bathe      Comments:   No tub baths for 6 weeks   Driving Restrictions      Comments:   No driving for 3 days or while taking narcotics   Lifting restrictions      Comments:   No lifting > 5 lbs for 6 weeks   Sexual Activity Restrictions      Comments:   No intercourse for  8 weeks   Discharge wound care:      Comments:   Keep clean and dry   Call MD for:  temperature >100.4      Call MD for:  persistant nausea and vomiting      Call MD for:  severe uncontrolled pain      Call MD for:  redness, tenderness, or signs of infection (pain, swelling, redness, odor or green/yellow  discharge around incision site)      Call MD for:  persistant dizziness or light-headedness      Call MD for:  extreme fatigue          Medication List     As of 02/05/2012  9:05 PM    STOP taking these medications         ibuprofen 800 MG tablet   Commonly known as: ADVIL,MOTRIN      TAKE these medications         doxycycline 100 MG tablet   Commonly known as: VIBRA-TABS   Take 100 mg by mouth 2 (two) times daily.      HYDROcodone-ibuprofen 7.5-200 MG per tablet   Commonly known as: VICOPROFEN   Take 1 tablet by mouth every 8 (eight) hours as needed.      metroNIDAZOLE 500 MG tablet   Commonly known as: FLAGYL   Take 500 mg by mouth 2 (two) times daily.      VICKS NYQUIL MULTI-SYMPTOM 30-12.08-998 MG/30ML Liqd   Generic drug: DM-Doxylamine-Acetaminophen   Take 15 mLs by mouth every 6 (six) hours as needed. For cough and sneezing           Follow-up Information    Follow up with Antionette Char A, MD. Schedule an appointment as soon as possible for a visit in 1 week.   Contact information:   [address] [place] [address] [phone]          Signed: MICHIAH, MASSE 02/05/2012, 9:05 PM

## 2012-02-07 ENCOUNTER — Ambulatory Visit (HOSPITAL_COMMUNITY)
Admission: RE | Admit: 2012-02-07 | Discharge: 2012-02-07 | Disposition: A | Discharge status: Home / Self Care | Payer: Medicaid Other | Source: Ambulatory Visit | Attending: Obstetrics & Gynecology | Admitting: Obstetrics & Gynecology

## 2012-02-07 ENCOUNTER — Other Ambulatory Visit: Payer: Self-pay | Admitting: Obstetrics & Gynecology

## 2012-02-07 DIAGNOSIS — Z90710 Acquired absence of both cervix and uterus: Secondary | ICD-10-CM | POA: Insufficient documentation

## 2012-02-07 DIAGNOSIS — N926 Irregular menstruation, unspecified: Secondary | ICD-10-CM

## 2012-02-07 DIAGNOSIS — R109 Unspecified abdominal pain: Secondary | ICD-10-CM | POA: Insufficient documentation

## 2012-02-07 DIAGNOSIS — N949 Unspecified condition associated with female genital organs and menstrual cycle: Secondary | ICD-10-CM | POA: Insufficient documentation

## 2012-02-21 ENCOUNTER — Other Ambulatory Visit: Payer: Self-pay | Admitting: Obstetrics & Gynecology

## 2012-02-21 DIAGNOSIS — N949 Unspecified condition associated with female genital organs and menstrual cycle: Secondary | ICD-10-CM

## 2012-02-23 ENCOUNTER — Ambulatory Visit (HOSPITAL_COMMUNITY)
Admission: RE | Admit: 2012-02-23 | Discharge: 2012-02-23 | Disposition: A | Discharge status: Home / Self Care | Payer: Medicaid Other | Source: Ambulatory Visit | Attending: Obstetrics & Gynecology | Admitting: Obstetrics & Gynecology

## 2012-02-23 DIAGNOSIS — N949 Unspecified condition associated with female genital organs and menstrual cycle: Secondary | ICD-10-CM | POA: Insufficient documentation

## 2013-02-22 ENCOUNTER — Encounter (HOSPITAL_COMMUNITY): Payer: Self-pay | Admitting: Emergency Medicine

## 2013-02-22 ENCOUNTER — Emergency Department (HOSPITAL_COMMUNITY): Payer: Medicaid Other

## 2013-02-22 ENCOUNTER — Emergency Department (HOSPITAL_COMMUNITY)
Admission: EM | Admit: 2013-02-22 | Discharge: 2013-02-22 | Disposition: A | Discharge status: Home / Self Care | Payer: Medicaid Other | Attending: Emergency Medicine | Admitting: Emergency Medicine

## 2013-02-22 ENCOUNTER — Other Ambulatory Visit: Payer: Self-pay | Admitting: Obstetrics

## 2013-02-22 DIAGNOSIS — F17200 Nicotine dependence, unspecified, uncomplicated: Secondary | ICD-10-CM | POA: Insufficient documentation

## 2013-02-22 DIAGNOSIS — R42 Dizziness and giddiness: Secondary | ICD-10-CM | POA: Insufficient documentation

## 2013-02-22 DIAGNOSIS — Z79899 Other long term (current) drug therapy: Secondary | ICD-10-CM | POA: Insufficient documentation

## 2013-02-22 DIAGNOSIS — Z8742 Personal history of other diseases of the female genital tract: Secondary | ICD-10-CM | POA: Insufficient documentation

## 2013-02-22 DIAGNOSIS — M545 Low back pain, unspecified: Secondary | ICD-10-CM

## 2013-02-22 DIAGNOSIS — R61 Generalized hyperhidrosis: Secondary | ICD-10-CM | POA: Insufficient documentation

## 2013-02-22 DIAGNOSIS — Z3202 Encounter for pregnancy test, result negative: Secondary | ICD-10-CM | POA: Insufficient documentation

## 2013-02-22 LAB — URINALYSIS, ROUTINE W REFLEX MICROSCOPIC
Bilirubin Urine: NEGATIVE
Glucose, UA: NEGATIVE mg/dL
Hgb urine dipstick: NEGATIVE
Ketones, ur: NEGATIVE mg/dL
Leukocytes, UA: NEGATIVE
Nitrite: NEGATIVE
Protein, ur: NEGATIVE mg/dL
Specific Gravity, Urine: 1.017 (ref 1.005–1.030)
Urobilinogen, UA: 0.2 mg/dL (ref 0.0–1.0)
pH: 5.5 (ref 5.0–8.0)

## 2013-02-22 LAB — CBC WITH DIFFERENTIAL/PLATELET
Basophils Absolute: 0 10*3/uL (ref 0.0–0.1)
Basophils Relative: 0 % (ref 0–1)
Eosinophils Absolute: 0.1 10*3/uL (ref 0.0–0.7)
Eosinophils Relative: 1 % (ref 0–5)
HCT: 36.8 % (ref 36.0–46.0)
Hemoglobin: 12.6 g/dL (ref 12.0–15.0)
Lymphocytes Relative: 28 % (ref 12–46)
Lymphs Abs: 3.4 10*3/uL (ref 0.7–4.0)
MCH: 27.3 pg (ref 26.0–34.0)
MCHC: 34.2 g/dL (ref 30.0–36.0)
MCV: 79.8 fL (ref 78.0–100.0)
Monocytes Absolute: 0.6 10*3/uL (ref 0.1–1.0)
Monocytes Relative: 5 % (ref 3–12)
Neutro Abs: 8 10*3/uL — ABNORMAL HIGH (ref 1.7–7.7)
Neutrophils Relative %: 66 % (ref 43–77)
Platelets: 381 10*3/uL (ref 150–400)
RBC: 4.61 MIL/uL (ref 3.87–5.11)
RDW: 13.7 % (ref 11.5–15.5)
WBC: 12.1 10*3/uL — ABNORMAL HIGH (ref 4.0–10.5)

## 2013-02-22 LAB — GLUCOSE, CAPILLARY: Glucose-Capillary: 78 mg/dL (ref 70–99)

## 2013-02-22 LAB — BASIC METABOLIC PANEL
BUN: 10 mg/dL (ref 6–23)
CO2: 25 mEq/L (ref 19–32)
Calcium: 8.8 mg/dL (ref 8.4–10.5)
Chloride: 100 mEq/L (ref 96–112)
Creatinine, Ser: 0.7 mg/dL (ref 0.50–1.10)
GFR calc Af Amer: >90 mL/min (ref >90)
GFR calc non Af Amer: >90 mL/min (ref >90)
Glucose, Bld: 82 mg/dL (ref 70–99)
Potassium: 4.9 mEq/L (ref 3.5–5.1)
Sodium: 134 mEq/L — ABNORMAL LOW (ref 135–145)

## 2013-02-22 LAB — POCT PREGNANCY, URINE: Preg Test, Ur: NEGATIVE

## 2013-02-22 MED ORDER — KETOROLAC TROMETHAMINE 30 MG/ML IJ SOLN
30.0000 mg | Freq: Once | INTRAMUSCULAR | Status: DC
  Filled 2013-02-22: qty 1

## 2013-02-22 MED ORDER — KETOROLAC TROMETHAMINE 30 MG/ML IJ SOLN
30.0000 mg | Freq: Once | INTRAMUSCULAR | Status: AC
  Administered 2013-02-22: 30 mg via INTRAVENOUS

## 2013-02-22 MED ORDER — METHOCARBAMOL 500 MG PO TABS: 500.0000 mg | ORAL_TABLET | Freq: Two times a day (BID) | ORAL | Status: DC

## 2013-02-22 MED ORDER — TRAMADOL HCL 50 MG PO TABS: 50.0000 mg | ORAL_TABLET | Freq: Four times a day (QID) | ORAL | Status: DC | PRN

## 2013-02-22 NOTE — ED Notes (Signed)
Pt belongings placed in belongings bag placed at bedside. Items include black running jacket white tshirt white sports bra and black bra.

## 2013-02-22 NOTE — ED Notes (Signed)
Took pts CBG CBG was 78

## 2013-02-22 NOTE — ED Provider Notes (Signed)
CSN: [id number]     Arrival date & time 02/22/13  1157 History   First MD Initiated Contact with Patient 02/22/13 1204     Chief Complaint  Patient presents with  . Back Pain  . Dizziness   (Consider location/radiation/quality/duration/timing/severity/associated sxs/prior Treatment) HPI Comments: Patient is a 37 year old female with no significant past medical history who presents for low back pain with onset 3 weeks ago. Patient states that symptoms have been worsening over the past 48 hours. She states that this morning, upon getting out of bed, she was ambulating to the bathroom when she began to feel dizzy and diaphoretic. Patient states she felt as though she was going to pass out but denies syncope. Symptoms resolved spontaneously after a minute or so. Onset of these associated symptoms correlated to worsening of her low back pain. She describes the pain in her back as a throbbing and states that it is nonradiating. She has tried a heating pad, Percocet, Tylenol, and Aleve for symptoms without relief. Patient denies any aggravating factors of her symptoms as well as any trauma or injury inciting her pain. She denies associated fever, chest pain or shortness of breath, nausea or vomiting, urinary symptoms, vaginal bleeding or discharge, perianal numbness, saddle anesthesia, bowel or bladder incontinence, numbness/tingling, weakness, and an inability to ambulate. Patient also denies a history of hypertension, coronary artery disease, and diabetes mellitus.  Patient is a 37 y.o. female presenting with back pain. The history is provided by the patient. No language interpreter was used.  Back Pain   Past Medical History  Diagnosis Date  . Uterine fibroid    Past Surgical History  Procedure Laterality Date  . No past surgeries     No family history on file. History  Substance Use Topics  . Smoking status: Current Some Day Smoker -- 1.00 packs/day for 22 years    Types: Cigarettes  .  Smokeless tobacco: Never Used  . Alcohol Use: Yes     Comment: none recent   OB History   Grav Para Term Preterm Abortions TAB SAB Ect Mult Living                 Review of Systems  Constitutional: Positive for diaphoresis.  Musculoskeletal: Positive for back pain.  Neurological: Positive for dizziness.  All other systems reviewed and are negative.    Allergies  Review of patient's allergies indicates no known allergies.  Home Medications   Current Outpatient Rx  Name  Route  Sig  Dispense  Refill  . naproxen sodium (ANAPROX) 220 MG tablet   Oral   Take 440 mg by mouth daily as needed (pain).         Marland Kitchen oxycodone (OXY-IR) 5 MG capsule   Oral   Take 5 mg by mouth every 4 (four) hours as needed for pain.         . methocarbamol (ROBAXIN) 500 MG tablet   Oral   Take 1 tablet (500 mg total) by mouth 2 (two) times daily.   20 tablet   0   . traMADol (ULTRAM) 50 MG tablet   Oral   Take 1 tablet (50 mg total) by mouth every 6 (six) hours as needed for pain.   15 tablet   0    BP 127/87  Pulse 84  Resp 16  SpO2 99%  LMP 01/17/2012  Physical Exam  Nursing note and vitals reviewed. Constitutional: She is oriented to person, place, and time. She appears well-developed and  well-nourished. No distress.  HENT:  Head: Normocephalic and atraumatic.  Eyes: Conjunctivae and EOM are normal. No scleral icterus.  Neck: Normal range of motion.  Cardiovascular: Normal rate, regular rhythm and intact distal pulses.   DP and PT pulses 2+ bilaterally  Pulmonary/Chest: Effort normal. No respiratory distress.  Musculoskeletal: Normal range of motion. She exhibits no edema.  Tenderness to palpation of the lumbar paraspinal muscles, just right of lumbosacral midline. No midline tenderness appreciated. No bony deformities or step-offs palpated. Positive straight leg raise and negative cross straight leg raise.  Neurological: She is alert and oriented to person, place, and time. She  has normal reflexes. She exhibits normal muscle tone.  Patient moves extremities without ataxia. No sensory or motor deficits appreciated. DTRs normal and symmetric.  Skin: Skin is warm and dry. No rash noted. She is not diaphoretic. No erythema. No pallor.  Psychiatric: She has a normal mood and affect. Her behavior is normal.    ED Course  Procedures (including critical care time) Labs Review Labs Reviewed  BASIC METABOLIC PANEL - Abnormal; Notable for the following:    Sodium 134 (*)    All other components within normal limits  CBC WITH DIFFERENTIAL - Abnormal; Notable for the following:    WBC 12.1 (*)    Neutro Abs 8.0 (*)    All other components within normal limits  URINALYSIS, ROUTINE W REFLEX MICROSCOPIC  GLUCOSE, CAPILLARY  POCT PREGNANCY, URINE   Imaging Review Dg Lumbar Spine Complete  02/22/2013   CLINICAL DATA:  37 year old female with back pain and dizziness.  EXAM: LUMBAR SPINE - COMPLETE 4+ VIEW  COMPARISON:  Abdomen radiographs 02/07/2012.  FINDINGS: Bone mineralization is within normal limits. Normal lumbar segmentation. Normal vertebral height and alignment. Relatively preserved disc spaces. No pars fracture. Sacral ala and SI joints within normal limits. Lower thoracic levels appear intact.  IMPRESSION: Negative radiographic appearance of the lumbar spine.   Electronically Signed   By: Augusto Gamble M.D.   On: 02/22/2013 14:33    EKG Interpretation     Ventricular Rate:  85 PR Interval:  189 QRS Duration: 72 QT Interval:  358 QTC Calculation: 426 R Axis:   54 Text Interpretation:  Sinus rhythm Low voltage, precordial leads Borderline ST elevation, lateral leads Baseline wander in lead(s) II III aVF            MDM   1. Low back pain    37 year old female presents for back pain x3 weeks with worsening since yesterday. Patient is well and nontoxic appearing, hemodynamically stable, and afebrile. Physical exam findings as above, with tenderness to  palpation of the lumbosacral paraspinal muscles just right of midline. No bony deformities or step-offs palpated. DG lumbar spine with normal bone mineralization and vertebral body height and alignment. No red flags or signs concerning for cauda equina; therefore, do not believe further work up with MR imaging is indicated at this time.  Patient endorsed saying fleeting episode of diaphoresis and dizziness with worsening pain today. No significant laboratory findings appreciated today as all are consistent with patient's baseline. Patient hemodynamically stable with orthostatic testing. She also has a normal EKG today. Doubt that symptoms reflect atypically presenting AAA as patient with point tenderness on exam and without history of CAD or pertinent family hx.   Symptoms c/w MSK pain. She is appropriate for d/c with orthopedic follow up; referral given. Ibuprofen and robaxin recommended for symptoms and Ultram prescribed for severe pain. Return precautions provided and  patient agreeable to plan with no unaddressed concerns.   Filed Vitals:   02/22/13 1330 02/22/13 1345 02/22/13 1351 02/22/13 1440  BP: 130/75 131/82 131/82 127/87  Pulse: 76 76 83 84  Resp: 17 11 18 16   SpO2: 100% 98% 99% 99%       Antony Madura, PA-C 02/22/13 1454

## 2013-02-22 NOTE — ED Provider Notes (Signed)
Medical screening examination/treatment/procedure(s) were performed by non-physician practitioner and as supervising physician I was immediately available for consultation/collaboration.  EKG Interpretation     Ventricular Rate:  85 PR Interval:  189 QRS Duration: 72 QT Interval:  358 QTC Calculation: 426 R Axis:   54 Text Interpretation:  Sinus rhythm Low voltage, precordial leads Borderline ST elevation, lateral leads Baseline wander in lead(s) II III aVF              Haillie Radu N Radiah Lubinski, DO 02/22/13 1622

## 2013-02-22 NOTE — ED Notes (Addendum)
Onset 3 weeks mid back pain.  No known injuries or LE numbness or tingling.  Onset this morning back pain worsened and had dizziness.  Tylenol and Ibuprofen not effective.  Pt c/o right lateral foot pain, feels pain in buttocks when foot flexed.  No change in pts normal activities d/t back pain.

## 2013-02-22 NOTE — ED Notes (Signed)
Back pain x 3 weeks has taken otc meds not helping and then this am got sweaty and dizzy after trying to get up  She drove herself today, pain worse in the am

## 2013-02-22 NOTE — ED Notes (Signed)
MD at bedside. 

## 2014-06-05 ENCOUNTER — Encounter (HOSPITAL_COMMUNITY): Payer: Self-pay | Admitting: *Deleted

## 2014-06-05 ENCOUNTER — Emergency Department (HOSPITAL_COMMUNITY)
Admission: EM | Admit: 2014-06-05 | Discharge: 2014-06-06 | Disposition: A | Discharge status: Home / Self Care | Payer: Medicaid Other | Attending: Emergency Medicine | Admitting: Emergency Medicine

## 2014-06-05 DIAGNOSIS — M5432 Sciatica, left side: Secondary | ICD-10-CM | POA: Insufficient documentation

## 2014-06-05 DIAGNOSIS — Z8742 Personal history of other diseases of the female genital tract: Secondary | ICD-10-CM | POA: Insufficient documentation

## 2014-06-05 DIAGNOSIS — Z720 Tobacco use: Secondary | ICD-10-CM | POA: Insufficient documentation

## 2014-06-05 DIAGNOSIS — Z79899 Other long term (current) drug therapy: Secondary | ICD-10-CM | POA: Insufficient documentation

## 2014-06-05 MED ORDER — KETOROLAC TROMETHAMINE 60 MG/2ML IM SOLN
60.0000 mg | Freq: Once | INTRAMUSCULAR | Status: AC
  Administered 2014-06-06: 60 mg via INTRAMUSCULAR
  Filled 2014-06-05: qty 2

## 2014-06-05 MED ORDER — DIAZEPAM 5 MG PO TABS
5.0000 mg | ORAL_TABLET | Freq: Once | ORAL | Status: AC
  Administered 2014-06-06: 5 mg via ORAL
  Filled 2014-06-05: qty 1

## 2014-06-05 MED ORDER — HYDROMORPHONE HCL 1 MG/ML IJ SOLN
2.0000 mg | Freq: Once | INTRAMUSCULAR | Status: AC
  Administered 2014-06-06: 2 mg via INTRAMUSCULAR

## 2014-06-05 MED ORDER — HYDROMORPHONE HCL 1 MG/ML IJ SOLN
2.0000 mg | Freq: Once | INTRAMUSCULAR | Status: DC
  Filled 2014-06-05: qty 2

## 2014-06-05 NOTE — ED Notes (Signed)
The pt is c/o lower back pain for months.  For the past 3-4 days she has had pain from her lt buttocks down the back of her legs.  And also on the rt.  lmp none hyst

## 2014-06-05 NOTE — ED Provider Notes (Signed)
CSN: [id number]     Arrival date & time 06/05/14  2308 History   First MD Initiated Contact with Patient 06/05/14 2334     Chief Complaint  Patient presents with  . Back Pain     (Consider location/radiation/quality/duration/timing/severity/associated sxs/prior Treatment) HPI Comments: Patient here complaining of increased lower back pain for several months that is localized to her left sciatic nerve region. Pain radiates down her left leg and is not associated with bowel or bladder dysfunction. Denies any saddle anesthesias. Also has some numbness to her right leg. Symptoms characterized as sharp and worse with standing or movement. Has been using Motrin without relief. Has not seen her Dr. for similar symptoms. Denies any recent history of trauma  Patient is a 39 y.o. female presenting with back pain. The history is provided by the patient.  Back Pain   Past Medical History  Diagnosis Date  . Uterine fibroid    Past Surgical History  Procedure Laterality Date  . No past surgeries     No family history on file. History  Substance Use Topics  . Smoking status: Current Some Day Smoker -- 1.00 packs/day for 22 years    Types: Cigarettes  . Smokeless tobacco: Never Used  . Alcohol Use: Yes     Comment: none recent   OB History    No data available     Review of Systems  Musculoskeletal: Positive for back pain.  All other systems reviewed and are negative.     Allergies  Review of patient's allergies indicates no known allergies.  Home Medications   Prior to Admission medications   Medication Sig Start Date End Date Taking? Authorizing Provider  ibuprofen (ADVIL,MOTRIN) 800 MG tablet TAKE 1 TABLET EVERY 8 HOURS AS NEEDED. 02/22/13   Shelly Bombard, MD  methocarbamol (ROBAXIN) 500 MG tablet Take 1 tablet (500 mg total) by mouth 2 (two) times daily. 02/22/13   Antonietta Breach, PA-C  naproxen sodium (ANAPROX) 220 MG tablet Take 440 mg by mouth daily as needed (pain).     Historical Provider, MD  oxycodone (OXY-IR) 5 MG capsule Take 5 mg by mouth every 4 (four) hours as needed for pain.    Historical Provider, MD  traMADol (ULTRAM) 50 MG tablet Take 1 tablet (50 mg total) by mouth every 6 (six) hours as needed for pain. 02/22/13   Antonietta Breach, PA-C   BP 139/97 mmHg  Pulse 91  Temp(Src) 97.3 F (36.3 C)  Resp 18  SpO2 94%  LMP 01/17/2012 Physical Exam  Constitutional: She is oriented to person, place, and time. She appears well-developed and well-nourished.  Non-toxic appearance. No distress.  HENT:  Head: Normocephalic and atraumatic.  Eyes: Conjunctivae, EOM and lids are normal. Pupils are equal, round, and reactive to light.  Neck: Normal range of motion. Neck supple. No tracheal deviation present. No thyroid mass present.  Cardiovascular: Normal rate, regular rhythm and normal heart sounds.  Exam reveals no gallop.   No murmur heard. Pulmonary/Chest: Effort normal and breath sounds normal. No stridor. No respiratory distress. She has no decreased breath sounds. She has no wheezes. She has no rhonchi. She has no rales.  Abdominal: Soft. Normal appearance and bowel sounds are normal. She exhibits no distension. There is no tenderness. There is no rebound and no CVA tenderness.  Musculoskeletal: Normal range of motion. She exhibits no edema or tenderness.       Legs: Neurological: She is alert and oriented to person, place, and time.  She has normal strength. No cranial nerve deficit or sensory deficit. GCS eye subscore is 4. GCS verbal subscore is 5. GCS motor subscore is 6.  Reflex Scores:      Patellar reflexes are 2+ on the right side and 2+ on the left side. Skin: Skin is warm and dry. No abrasion and no rash noted.  Psychiatric: She has a normal mood and affect. Her speech is normal and behavior is normal.  Nursing note and vitals reviewed.   ED Course  Procedures (including critical care time) Labs Review Labs Reviewed - No data to  display  Imaging Review No results found.   EKG Interpretation None      MDM   Final diagnoses:  None   patient given pain medications here. Feels better. Likely sciatica. No concern for cauda equina.    Leota Jacobsen, MD 06/07/14 [phone]

## 2014-06-06 MED ORDER — METHOCARBAMOL 750 MG PO TABS: 750.0000 mg | ORAL_TABLET | Freq: Four times a day (QID) | ORAL | Status: DC

## 2014-06-06 MED ORDER — PREDNISONE 20 MG PO TABS
60.0000 mg | ORAL_TABLET | Freq: Once | ORAL | Status: AC
  Administered 2014-06-06: 60 mg via ORAL
  Filled 2014-06-06: qty 3

## 2014-06-06 MED ORDER — PREDNISONE 10 MG PO TABS: 20.0000 mg | ORAL_TABLET | Freq: Every day | ORAL | Status: DC

## 2014-06-06 MED ORDER — OXYCODONE-ACETAMINOPHEN 5-325 MG PO TABS: 1.0000 | ORAL_TABLET | ORAL | Status: DC | PRN

## 2014-06-06 NOTE — ED Notes (Signed)
Patient states she is hurting real bad across her lower back and down the side of her right leg to her foot.  States she has tried everything but nothing helps.

## 2014-06-06 NOTE — Discharge Instructions (Signed)
Sciatica Sciatica is pain, weakness, numbness, or tingling along the path of the sciatic nerve. The nerve starts in the lower back and runs down the back of each leg. The nerve controls the muscles in the lower leg and in the back of the knee, while also providing sensation to the back of the thigh, lower leg, and the sole of your foot. Sciatica is a symptom of another medical condition. For instance, nerve damage or certain conditions, such as a herniated disk or bone spur on the spine, pinch or put pressure on the sciatic nerve. This causes the pain, weakness, or other sensations normally associated with sciatica. Generally, sciatica only affects one side of the body. CAUSES   Herniated or slipped disc.  Degenerative disk disease.  A pain disorder involving the narrow muscle in the buttocks (piriformis syndrome).  Pelvic injury or fracture.  Pregnancy.  Tumor (rare). SYMPTOMS  Symptoms can vary from mild to very severe. The symptoms usually travel from the low back to the buttocks and down the back of the leg. Symptoms can include:  Mild tingling or dull aches in the lower back, leg, or hip.  Numbness in the back of the calf or sole of the foot.  Burning sensations in the lower back, leg, or hip.  Sharp pains in the lower back, leg, or hip.  Leg weakness.  Severe back pain inhibiting movement. These symptoms may get worse with coughing, sneezing, laughing, or prolonged sitting or standing. Also, being overweight may worsen symptoms. DIAGNOSIS  Your caregiver will perform a physical exam to look for common symptoms of sciatica. He or she may ask you to do certain movements or activities that would trigger sciatic nerve pain. Other tests may be performed to find the cause of the sciatica. These may include:  Blood tests.  X-rays.  Imaging tests, such as an MRI or CT scan. TREATMENT  Treatment is directed at the cause of the sciatic pain. Sometimes, treatment is not necessary  and the pain and discomfort goes away on its own. If treatment is needed, your caregiver may suggest:  Over-the-counter medicines to relieve pain.  Prescription medicines, such as anti-inflammatory medicine, muscle relaxants, or narcotics.  Applying heat or ice to the painful area.  Steroid injections to lessen pain, irritation, and inflammation around the nerve.  Reducing activity during periods of pain.  Exercising and stretching to strengthen your abdomen and improve flexibility of your spine. Your caregiver may suggest losing weight if the extra weight makes the back pain worse.  Physical therapy.  Surgery to eliminate what is pressing or pinching the nerve, such as a bone spur or part of a herniated disk. HOME CARE INSTRUCTIONS   Only take over-the-counter or prescription medicines for pain or discomfort as directed by your caregiver.  Apply ice to the affected area for 20 minutes, 3-4 times a day for the first 48-72 hours. Then try heat in the same way.  Exercise, stretch, or perform your usual activities if these do not aggravate your pain.  Attend physical therapy sessions as directed by your caregiver.  Keep all follow-up appointments as directed by your caregiver.  Do not wear high heels or shoes that do not provide proper support.  Check your mattress to see if it is too soft. A firm mattress may lessen your pain and discomfort. SEEK IMMEDIATE MEDICAL CARE IF:   You lose control of your bowel or bladder (incontinence).  You have increasing weakness in the lower back, pelvis, buttocks,   or legs.  You have redness or swelling of your back.  You have a burning sensation when you urinate.  You have pain that gets worse when you lie down or awakens you at night.  Your pain is worse than you have experienced in the past.  Your pain is lasting longer than 4 weeks.  You are suddenly losing weight without reason. MAKE SURE YOU:  Understand these  instructions.  Will watch your condition.  Will get help right away if you are not doing well or get worse. Document Released: 04/13/2001 Document Revised: 10/19/2011 Document Reviewed: 08/29/2011 [employer] Patient Information 2015 [employer]. This information is not intended to replace advice given to you by your health care provider. Make sure you discuss any questions you have with your health care provider.  

## 2014-08-02 ENCOUNTER — Emergency Department (HOSPITAL_COMMUNITY)
Admission: EM | Admit: 2014-08-02 | Discharge: 2014-08-02 | Disposition: A | Discharge status: Home / Self Care | Payer: Self-pay | Attending: Emergency Medicine | Admitting: Emergency Medicine

## 2014-08-02 ENCOUNTER — Encounter (HOSPITAL_COMMUNITY): Payer: Self-pay | Admitting: *Deleted

## 2014-08-02 ENCOUNTER — Emergency Department (HOSPITAL_COMMUNITY): Payer: Medicaid Other

## 2014-08-02 DIAGNOSIS — Z720 Tobacco use: Secondary | ICD-10-CM | POA: Insufficient documentation

## 2014-08-02 DIAGNOSIS — F419 Anxiety disorder, unspecified: Secondary | ICD-10-CM | POA: Insufficient documentation

## 2014-08-02 DIAGNOSIS — M25559 Pain in unspecified hip: Secondary | ICD-10-CM

## 2014-08-02 DIAGNOSIS — M5432 Sciatica, left side: Secondary | ICD-10-CM | POA: Insufficient documentation

## 2014-08-02 DIAGNOSIS — Z8742 Personal history of other diseases of the female genital tract: Secondary | ICD-10-CM | POA: Insufficient documentation

## 2014-08-02 DIAGNOSIS — R61 Generalized hyperhidrosis: Secondary | ICD-10-CM | POA: Insufficient documentation

## 2014-08-02 DIAGNOSIS — M25552 Pain in left hip: Secondary | ICD-10-CM | POA: Insufficient documentation

## 2014-08-02 DIAGNOSIS — R0602 Shortness of breath: Secondary | ICD-10-CM | POA: Insufficient documentation

## 2014-08-02 LAB — COMPREHENSIVE METABOLIC PANEL
ALT: 22 U/L (ref 0–35)
AST: 22 U/L (ref 0–37)
Albumin: 3.2 g/dL — ABNORMAL LOW (ref 3.5–5.2)
Alkaline Phosphatase: 77 U/L (ref 39–117)
Anion gap: 10 (ref 5–15)
BUN: 10 mg/dL (ref 6–23)
CO2: 22 mmol/L (ref 19–32)
Calcium: 8.7 mg/dL (ref 8.4–10.5)
Chloride: 104 mmol/L (ref 96–112)
Creatinine, Ser: 0.9 mg/dL (ref 0.50–1.10)
GFR calc Af Amer: >90 mL/min (ref >90)
GFR calc non Af Amer: 80 mL/min — ABNORMAL LOW (ref >90)
Glucose, Bld: 97 mg/dL (ref 70–99)
Potassium: 3.8 mmol/L (ref 3.5–5.1)
Sodium: 136 mmol/L (ref 135–145)
Total Bilirubin: 0.3 mg/dL (ref 0.3–1.2)
Total Protein: 8.1 g/dL (ref 6.0–8.3)

## 2014-08-02 LAB — CBC WITH DIFFERENTIAL/PLATELET
Basophils Absolute: 0 10*3/uL (ref 0.0–0.1)
Basophils Relative: 0 % (ref 0–1)
Eosinophils Absolute: 0.1 10*3/uL (ref 0.0–0.7)
Eosinophils Relative: 1 % (ref 0–5)
HCT: 36.4 % (ref 36.0–46.0)
Hemoglobin: 11.9 g/dL — ABNORMAL LOW (ref 12.0–15.0)
Lymphocytes Relative: 30 % (ref 12–46)
Lymphs Abs: 4.7 10*3/uL — ABNORMAL HIGH (ref 0.7–4.0)
MCH: 25.9 pg — ABNORMAL LOW (ref 26.0–34.0)
MCHC: 32.7 g/dL (ref 30.0–36.0)
MCV: 79.1 fL (ref 78.0–100.0)
Monocytes Absolute: 0.9 10*3/uL (ref 0.1–1.0)
Monocytes Relative: 6 % (ref 3–12)
Neutro Abs: 10.1 10*3/uL — ABNORMAL HIGH (ref 1.7–7.7)
Neutrophils Relative %: 63 % (ref 43–77)
Platelets: 446 10*3/uL — ABNORMAL HIGH (ref 150–400)
RBC: 4.6 MIL/uL (ref 3.87–5.11)
RDW: 14.1 % (ref 11.5–15.5)
WBC: 15.9 10*3/uL — ABNORMAL HIGH (ref 4.0–10.5)

## 2014-08-02 LAB — PROTIME-INR
INR: 1.06 (ref 0.00–1.49)
Prothrombin Time: 13.9 seconds (ref 11.6–15.2)

## 2014-08-02 LAB — D-DIMER, QUANTITATIVE: D-Dimer, Quant: 0.27 ug/mL-FEU (ref 0.00–0.48)

## 2014-08-02 MED ORDER — METOCLOPRAMIDE HCL 5 MG/ML IJ SOLN
10.0000 mg | Freq: Once | INTRAMUSCULAR | Status: AC
  Administered 2014-08-02: 10 mg via INTRAVENOUS
  Filled 2014-08-02: qty 2

## 2014-08-02 MED ORDER — MORPHINE SULFATE 4 MG/ML IJ SOLN
4.0000 mg | Freq: Once | INTRAMUSCULAR | Status: AC
  Administered 2014-08-02: 4 mg via INTRAMUSCULAR
  Filled 2014-08-02: qty 1

## 2014-08-02 MED ORDER — OXYCODONE-ACETAMINOPHEN 5-325 MG PO TABS: 1.0000 | ORAL_TABLET | Freq: Four times a day (QID) | ORAL | Status: DC | PRN

## 2014-08-02 MED ORDER — ONDANSETRON HCL 4 MG/2ML IJ SOLN
4.0000 mg | Freq: Once | INTRAMUSCULAR | Status: AC
  Administered 2014-08-02: 4 mg via INTRAVENOUS
  Filled 2014-08-02: qty 2

## 2014-08-02 MED ORDER — KETOROLAC TROMETHAMINE 30 MG/ML IJ SOLN
30.0000 mg | Freq: Once | INTRAMUSCULAR | Status: AC
  Administered 2014-08-02: 30 mg via INTRAVENOUS
  Filled 2014-08-02: qty 1

## 2014-08-02 MED ORDER — HYDROMORPHONE HCL 1 MG/ML IJ SOLN
1.0000 mg | Freq: Once | INTRAMUSCULAR | Status: AC
  Administered 2014-08-02: 1 mg via INTRAVENOUS
  Filled 2014-08-02: qty 1

## 2014-08-02 MED ORDER — PREDNISONE 20 MG PO TABS
60.0000 mg | ORAL_TABLET | Freq: Once | ORAL | Status: AC
  Administered 2014-08-02: 60 mg via ORAL
  Filled 2014-08-02: qty 3

## 2014-08-02 MED ORDER — IBUPROFEN 400 MG PO TABS
800.0000 mg | ORAL_TABLET | Freq: Once | ORAL | Status: AC
  Administered 2014-08-02: 800 mg via ORAL
  Filled 2014-08-02: qty 2

## 2014-08-02 MED ORDER — SODIUM CHLORIDE 0.9 % IV BOLUS (SEPSIS)
1000.0000 mL | Freq: Once | INTRAVENOUS | Status: AC
  Administered 2014-08-02: 1000 mL via INTRAVENOUS

## 2014-08-02 MED ORDER — OXYCODONE-ACETAMINOPHEN 5-325 MG PO TABS
1.0000 | ORAL_TABLET | Freq: Once | ORAL | Status: AC
  Administered 2014-08-02: 1 via ORAL
  Filled 2014-08-02: qty 1

## 2014-08-02 MED ORDER — LORAZEPAM 2 MG/ML IJ SOLN
0.5000 mg | Freq: Once | INTRAMUSCULAR | Status: AC
  Administered 2014-08-02: 0.5 mg via INTRAVENOUS
  Filled 2014-08-02: qty 1

## 2014-08-02 MED ORDER — ONDANSETRON 4 MG PO TBDP
4.0000 mg | ORAL_TABLET | Freq: Once | ORAL | Status: AC
  Administered 2014-08-02: 4 mg via ORAL
  Filled 2014-08-02: qty 1

## 2014-08-02 MED ORDER — CYCLOBENZAPRINE HCL 10 MG PO TABS: 10.0000 mg | ORAL_TABLET | Freq: Two times a day (BID) | ORAL | Status: DC | PRN

## 2014-08-02 MED ORDER — PREDNISONE 10 MG PO TABS: 40.0000 mg | ORAL_TABLET | Freq: Every day | ORAL | Status: DC

## 2014-08-02 MED ORDER — DIAZEPAM 5 MG PO TABS
5.0000 mg | ORAL_TABLET | Freq: Once | ORAL | Status: DC
  Filled 2014-08-02: qty 1

## 2014-08-02 NOTE — ED Provider Notes (Signed)
CSN: [id number]     Arrival date & time 08/02/14  1728 History   None    Chief Complaint  Patient presents with  . Hip Pain    Patient is a 39 y.o. female presenting with hip pain. The history is provided by the patient. No language interpreter was used.  Hip Pain Associated symptoms include shortness of breath.   This chart was scribed for non-physician practitioner Delos Haring, PA-C, working with Ezequiel Essex, MD, by Thea Alken, ED Scribe. This patient was seen in room TR11C/TR11C and the patient's care was started at 12:25 AM.  Madison [name] is a 39 y.o. female who presents to the Emergency Department complaining of left hip pain. Pt states pain initially started in lower back and would radiate down left lower leg. She reports she now feels as if there is a knot in left calf that shoots up her leg into left buttock, described as an "electrical shock". Pt reports SOB as if she gasping for air as well as dizziness, feeling faint, and sweats when the pain gets severe and she becomes anxious. When the pain settles down she does not have SOB or dizziness. Due to the pain she reports she was unable to sleep or get comfortable last night. Pt has tried ibuprofen 800mg  and oxycodone, which she had left over from her hysterectomy surgery 2-3 years ago, without relief to pain. Pt reports she is in much more pain now than after surgery. She denies hx of blood clots. She denies recent travel.  She denies bowel or urine incontinence. She is able to ambulate and is pacing. She has had no weakness or numbness. Denies any specific injury that she remembers.  Past Medical History  Diagnosis Date  . Uterine fibroid    Past Surgical History  Procedure Laterality Date  . No past surgeries     No family history on file. History  Substance Use Topics  . Smoking status: Current Some Day Smoker -- 1.00 packs/day for 22 years    Types: Cigarettes  . Smokeless tobacco: Never Used  . Alcohol Use: Yes      Comment: none recent   OB History    No data available     Review of Systems  Constitutional: Positive for diaphoresis.  Respiratory: Positive for shortness of breath.   Neurological: Positive for dizziness.  All other systems reviewed and are negative.  Allergies  Review of patient's allergies indicates no known allergies.  Home Medications   Prior to Admission medications   Medication Sig Start Date End Date Taking? Authorizing Provider  ibuprofen (ADVIL,MOTRIN) 200 MG tablet Take 200 mg by mouth every 6 (six) hours as needed for moderate pain.   Yes Historical Provider, MD  naproxen sodium (ANAPROX) 220 MG tablet Take 440 mg by mouth daily as needed (pain).   Yes Historical Provider, MD  oxycodone (OXY-IR) 5 MG capsule Take 5 mg by mouth every 4 (four) hours as needed for pain.   Yes Historical Provider, MD  oxyCODONE-acetaminophen (PERCOCET/ROXICET) 5-325 MG per tablet Take 1-2 tablets by mouth every 4 (four) hours as needed for severe pain. 06/06/14  Yes Lacretia Leigh, MD  cyclobenzaprine (FLEXERIL) 10 MG tablet Take 1 tablet (10 mg total) by mouth 2 (two) times daily as needed for muscle spasms. 08/02/14   Jermia Rigsby Carlota Raspberry, PA-C  ibuprofen (ADVIL,MOTRIN) 800 MG tablet TAKE 1 TABLET EVERY 8 HOURS AS NEEDED. Patient not taking: Reported on 08/02/2014 02/22/13   Shelly Bombard, MD  methocarbamol (ROBAXIN-750) 750 MG tablet Take 1 tablet (750 mg total) by mouth 4 (four) times daily. Patient not taking: Reported on 08/02/2014 06/06/14   Lacretia Leigh, MD  oxyCODONE-acetaminophen (PERCOCET/ROXICET) 5-325 MG per tablet Take 1-2 tablets by mouth every 6 (six) hours as needed. 08/02/14   Ardelle Haliburton Carlota Raspberry, PA-C  predniSONE (DELTASONE) 10 MG tablet Take 2 tablets (20 mg total) by mouth daily. Patient not taking: Reported on 08/02/2014 06/06/14   Lacretia Leigh, MD  predniSONE (DELTASONE) 10 MG tablet Take 4 tablets (40 mg total) by mouth daily. 08/02/14   Keyen Marban Carlota Raspberry, PA-C  traMADol (ULTRAM) 50 MG  tablet Take 1 tablet (50 mg total) by mouth every 6 (six) hours as needed for pain. Patient not taking: Reported on 08/02/2014 02/22/13   Antonietta Breach, PA-C   BP 144/98 mmHg  Pulse 100  Temp(Src) 98.1 F (36.7 C) (Oral)  Resp 18  Ht 5\' 1"  (1.549 m)  Wt 218 lb (98.884 kg)  BMI 41.21 kg/m2  SpO2 99%  LMP 01/17/2012 Physical Exam  Constitutional: She is oriented to person, place, and time. She appears well-developed and well-nourished. No distress.  HENT:  Head: Normocephalic and atraumatic.  Eyes: Conjunctivae and EOM are normal.  Neck: Neck supple.  Cardiovascular: Normal rate.   Pulmonary/Chest: Effort normal and breath sounds normal. No accessory muscle usage. No respiratory distress. She has no decreased breath sounds. She has no wheezes.  Musculoskeletal: Normal range of motion.       Right lower leg: She exhibits tenderness. She exhibits no bony tenderness, no swelling, no edema, no deformity and no laceration.       Legs: Pt has equal strength to bilateral lower extremities.  Neurosensory function adequate to both legs No clonus on dorsiflextion Skin color is normal. Skin is warm and moist.  I see no step off deformity, no midline bony tenderness.  Pt is able to ambulate.  No crepitus, laceration, effusion, induration, lesions, swelling.   Pedal pulses are symmetrical and palpable bilaterally  no tenderness to palpation of paraspinel muscles, hip, leg or calf to left Pt reports it is a deep pain   Neurological: She is alert and oriented to person, place, and time.  Skin: Skin is warm and dry.  Psychiatric: Her behavior is normal. Her mood appears anxious.  + tearful  Nursing note and vitals reviewed.   ED Course  Procedures (including critical care time) DIAGNOSTIC STUDIES: Oxygen Saturation is 99% on RA, normal by my interpretation.    COORDINATION OF CARE: 12:25 AM- Pt advised of plan for treatment and pt agrees.  Labs Review Labs Reviewed  CBC WITH  DIFFERENTIAL/PLATELET - Abnormal; Notable for the following:    WBC 15.9 (*)    Hemoglobin 11.9 (*)    MCH 25.9 (*)    Platelets 446 (*)    Neutro Abs 10.1 (*)    Lymphs Abs 4.7 (*)    All other components within normal limits  COMPREHENSIVE METABOLIC PANEL - Abnormal; Notable for the following:    Albumin 3.2 (*)    GFR calc non Af Amer 80 (*)    All other components within normal limits  D-DIMER, QUANTITATIVE  PROTIME-INR    Imaging Review Dg Lumbar Spine Complete  08/02/2014   CLINICAL DATA:  Low back pain with left-sided radiculopathy  EXAM: LUMBAR SPINE - COMPLETE 4+ VIEW  COMPARISON:  02/22/2013  FINDINGS: Five lumbar type vertebral bodies are well visualized. Vertebral body height is well maintained. No spondylolisthesis is noted.  No pars defects are noted. No gross soft tissue abnormality is noted.  IMPRESSION: No acute abnormality noted.   Electronically Signed   By: Inez Catalina M.D.   On: 08/02/2014 19:59   Dg Hip Unilat With Pelvis 2-3 Views Left  08/02/2014   CLINICAL DATA:  Left hip pain, no known injury, initial encounter  EXAM: LEFT HIP (WITH PELVIS) 2-3 VIEWS  COMPARISON:  None.  FINDINGS: No acute fracture dislocation is noted. Pelvic ring is intact. No soft tissue changes are seen.  IMPRESSION: No acute abnormality noted.   Electronically Signed   By: Inez Catalina M.D.   On: 08/02/2014 19:59     EKG Interpretation None      MDM   Final diagnoses:  Hip pain  Sciatica, left   Medications  ondansetron (ZOFRAN-ODT) disintegrating tablet 4 mg (4 mg Oral Given 08/02/14 1918)  oxyCODONE-acetaminophen (PERCOCET/ROXICET) 5-325 MG per tablet 1 tablet (1 tablet Oral Given 08/02/14 1919)  ibuprofen (ADVIL,MOTRIN) tablet 800 mg (800 mg Oral Given 08/02/14 1918)  morphine 4 MG/ML injection 4 mg (4 mg Intramuscular Given 08/02/14 1947)  predniSONE (DELTASONE) tablet 60 mg (60 mg Oral Given 08/02/14 1947)  sodium chloride 0.9 % bolus 1,000 mL (0 mLs Intravenous Stopped 08/02/14 2340)   ondansetron (ZOFRAN) injection 4 mg (4 mg Intravenous Given 08/02/14 2109)  HYDROmorphone (DILAUDID) injection 1 mg (1 mg Intravenous Given 08/02/14 2108)  metoCLOPramide (REGLAN) injection 10 mg (10 mg Intravenous Given 08/02/14 2138)  ketorolac (TORADOL) 30 MG/ML injection 30 mg (30 mg Intravenous Given 08/02/14 2153)  LORazepam (ATIVAN) injection 0.5 mg (0.5 mg Intravenous Given 08/02/14 2248)     Patient's pain managed in the emergency department. It took a significant amount of medication to get her pain from a 10 out of 10 down to a 4 out of 10. The patient was pacing through the emergency department during most of her visit and does not have any numbness or weakness. She's not had any bowel or urine incontinence. The pain appears to be a deep neuropathic pain.  He d-dimer was drawn to rule out possible DVT or PE in this test returned negative. The patient has CBC and CMP which shows mildly elevated white blood cell count at 15.9. This is actually improved from previous visits. Her hip and lumbar x-ray do not show any acute abnormalities such as back fractures, masses or herniated discs.  All prescribed the patient prednisone Dosepak, muscle relaxers and pain medication. But ultimately she will need to follow-up with orthopedic doctor. She will most likely need MRI for pain continues to be a severe as it is today. She's been given strict return to emergency department directions if she develops weakness, numbness, worsening pain, change in pain location or quality, bowel or urine incontinence.  39 y.o.Royal J Hoot's  with back pain. No neurological deficits and normal neuro exam. Patient can walk. No loss of bowel or bladder control. No concern for cauda equina at this time base on HPI and physical exam findings. No fever, night sweats, weight loss, h/o cancer, IVDU.   Patient Plan 1. Medications: NSAIDs and muscle relaxer. Cont usual home medications unless otherwise directed. 2. Treatment: rest,  drink plenty of fluids, gentle stretching as discussed, alternate ice and heat  3. Follow Up: Please followup with your primary doctor for discussion of your diagnoses and further evaluation after today's visit; if you do not have a primary care doctor use the resource guide provided to find one  Advised to  follow-up with the orthopedist if symptoms do not start to resolve in the next 2-3 days. If develop loss of bowel or urinary control return to the ED as soon as possible for further evaluation. To take the medications as prescribed as they can cause harm if not taken appropriately.   Vital signs are stable at discharge. Filed Vitals:   08/02/14 2248  BP: 144/98  Pulse: 100  Temp: 98.1 F (36.7 C)  Resp: 18    Patient/guardian has voiced understanding and agreed to follow-up with the PCP or specialist.    I personally performed the services described in this documentation, which was scribed in my presence. The recorded information has been reviewed and is accurate.   Delos Haring, PA-C 08/03/14 0025  Ezequiel Essex, MD 08/03/14 [phone]

## 2014-08-02 NOTE — ED Notes (Signed)
Pt discharged by [facility].

## 2014-08-02 NOTE — ED Notes (Signed)
The pt is c/o pain in her lt calf up into her lt hip and buttocks.  She has been diagnosed with sciatic pain.  The pain is worse now.  lmp none

## 2014-08-02 NOTE — Discharge Instructions (Signed)
Sciatica Sciatica is pain, weakness, numbness, or tingling along the path of the sciatic nerve. The nerve starts in the lower back and runs down the back of each leg. The nerve controls the muscles in the lower leg and in the back of the knee, while also providing sensation to the back of the thigh, lower leg, and the sole of your foot. Sciatica is a symptom of another medical condition. For instance, nerve damage or certain conditions, such as a herniated disk or bone spur on the spine, pinch or put pressure on the sciatic nerve. This causes the pain, weakness, or other sensations normally associated with sciatica. Generally, sciatica only affects one side of the body. CAUSES   Herniated or slipped disc.  Degenerative disk disease.  A pain disorder involving the narrow muscle in the buttocks (piriformis syndrome).  Pelvic injury or fracture.  Pregnancy.  Tumor (rare). SYMPTOMS  Symptoms can vary from mild to very severe. The symptoms usually travel from the low back to the buttocks and down the back of the leg. Symptoms can include:  Mild tingling or dull aches in the lower back, leg, or hip.  Numbness in the back of the calf or sole of the foot.  Burning sensations in the lower back, leg, or hip.  Sharp pains in the lower back, leg, or hip.  Leg weakness.  Severe back pain inhibiting movement. These symptoms may get worse with coughing, sneezing, laughing, or prolonged sitting or standing. Also, being overweight may worsen symptoms. DIAGNOSIS  Your caregiver will perform a physical exam to look for common symptoms of sciatica. He or she may ask you to do certain movements or activities that would trigger sciatic nerve pain. Other tests may be performed to find the cause of the sciatica. These may include:  Blood tests.  X-rays.  Imaging tests, such as an MRI or CT scan. TREATMENT  Treatment is directed at the cause of the sciatic pain. Sometimes, treatment is not necessary  and the pain and discomfort goes away on its own. If treatment is needed, your caregiver may suggest:  Over-the-counter medicines to relieve pain.  Prescription medicines, such as anti-inflammatory medicine, muscle relaxants, or narcotics.  Applying heat or ice to the painful area.  Steroid injections to lessen pain, irritation, and inflammation around the nerve.  Reducing activity during periods of pain.  Exercising and stretching to strengthen your abdomen and improve flexibility of your spine. Your caregiver may suggest losing weight if the extra weight makes the back pain worse.  Physical therapy.  Surgery to eliminate what is pressing or pinching the nerve, such as a bone spur or part of a herniated disk. HOME CARE INSTRUCTIONS   Only take over-the-counter or prescription medicines for pain or discomfort as directed by your caregiver.  Apply ice to the affected area for 20 minutes, 3-4 times a day for the first 48-72 hours. Then try heat in the same way.  Exercise, stretch, or perform your usual activities if these do not aggravate your pain.  Attend physical therapy sessions as directed by your caregiver.  Keep all follow-up appointments as directed by your caregiver.  Do not wear high heels or shoes that do not provide proper support.  Check your mattress to see if it is too soft. A firm mattress may lessen your pain and discomfort. SEEK IMMEDIATE MEDICAL CARE IF:   You lose control of your bowel or bladder (incontinence).  You have increasing weakness in the lower back, pelvis, buttocks,   or legs.  You have redness or swelling of your back.  You have a burning sensation when you urinate.  You have pain that gets worse when you lie down or awakens you at night.  Your pain is worse than you have experienced in the past.  Your pain is lasting longer than 4 weeks.  You are suddenly losing weight without reason. MAKE SURE YOU:  Understand these  instructions.  Will watch your condition.  Will get help right away if you are not doing well or get worse. Document Released: 04/13/2001 Document Revised: 10/19/2011 Document Reviewed: 08/29/2011 [employer] Patient Information 2015 [employer]. This information is not intended to replace advice given to you by your health care provider. Make sure you discuss any questions you have with your health care provider.  

## 2014-08-05 ENCOUNTER — Encounter (HOSPITAL_COMMUNITY): Payer: Self-pay | Admitting: Emergency Medicine

## 2014-08-05 ENCOUNTER — Emergency Department (HOSPITAL_COMMUNITY)
Admission: EM | Admit: 2014-08-05 | Discharge: 2014-08-05 | Disposition: A | Discharge status: Home / Self Care | Payer: Self-pay | Attending: Emergency Medicine | Admitting: Emergency Medicine

## 2014-08-05 ENCOUNTER — Emergency Department (HOSPITAL_COMMUNITY): Payer: Self-pay

## 2014-08-05 ENCOUNTER — Emergency Department (HOSPITAL_COMMUNITY): Payer: Medicaid Other

## 2014-08-05 DIAGNOSIS — R52 Pain, unspecified: Secondary | ICD-10-CM

## 2014-08-05 DIAGNOSIS — Z79899 Other long term (current) drug therapy: Secondary | ICD-10-CM | POA: Insufficient documentation

## 2014-08-05 DIAGNOSIS — Z7952 Long term (current) use of systemic steroids: Secondary | ICD-10-CM | POA: Insufficient documentation

## 2014-08-05 DIAGNOSIS — K5900 Constipation, unspecified: Secondary | ICD-10-CM | POA: Insufficient documentation

## 2014-08-05 DIAGNOSIS — Z86018 Personal history of other benign neoplasm: Secondary | ICD-10-CM | POA: Insufficient documentation

## 2014-08-05 DIAGNOSIS — Z720 Tobacco use: Secondary | ICD-10-CM | POA: Insufficient documentation

## 2014-08-05 DIAGNOSIS — M5432 Sciatica, left side: Secondary | ICD-10-CM | POA: Insufficient documentation

## 2014-08-05 LAB — URINALYSIS, ROUTINE W REFLEX MICROSCOPIC
Bilirubin Urine: NEGATIVE
Glucose, UA: NEGATIVE mg/dL
Hgb urine dipstick: NEGATIVE
Ketones, ur: NEGATIVE mg/dL
Leukocytes, UA: NEGATIVE
Nitrite: NEGATIVE
Protein, ur: NEGATIVE mg/dL
Specific Gravity, Urine: 1.024 (ref 1.005–1.030)
Urobilinogen, UA: 0.2 mg/dL (ref 0.0–1.0)
pH: 5.5 (ref 5.0–8.0)

## 2014-08-05 MED ORDER — POLYETHYLENE GLYCOL 3350 17 G PO PACK: 17.0000 g | PACK | Freq: Every day | ORAL | Status: DC

## 2014-08-05 MED ORDER — TRAMADOL HCL 50 MG PO TABS: 50.0000 mg | ORAL_TABLET | Freq: Four times a day (QID) | ORAL | Status: DC | PRN

## 2014-08-05 MED ORDER — KETOROLAC TROMETHAMINE 60 MG/2ML IM SOLN
60.0000 mg | Freq: Once | INTRAMUSCULAR | Status: AC
  Administered 2014-08-05: 60 mg via INTRAMUSCULAR
  Filled 2014-08-05: qty 2

## 2014-08-05 MED ORDER — HYDROMORPHONE HCL 1 MG/ML IJ SOLN
2.0000 mg | Freq: Once | INTRAMUSCULAR | Status: AC
  Administered 2014-08-05: 2 mg via INTRAMUSCULAR
  Filled 2014-08-05: qty 2

## 2014-08-05 MED ORDER — LORAZEPAM 2 MG/ML IJ SOLN
2.0000 mg | Freq: Once | INTRAMUSCULAR | Status: AC
  Administered 2014-08-05: 2 mg via INTRAMUSCULAR
  Filled 2014-08-05: qty 1

## 2014-08-05 NOTE — ED Provider Notes (Signed)
CSN: [id number]     Arrival date & time 08/05/14  1639 History   First MD Initiated Contact with Patient 08/05/14 1748     Chief Complaint  Patient presents with  . Leg Pain     (Consider location/radiation/quality/duration/timing/severity/associated sxs/prior Treatment) Patient is a 39 y.o. female presenting with leg pain. The history is provided by the patient. No language interpreter was used.  Leg Pain Location:  Leg Time since incident:  4 days Injury: no   Leg location:  L leg Pain details:    Quality:  Aching, shooting and tingling   Radiates to:  Does not radiate   Severity:  No pain   Onset quality:  Gradual   Timing:  Constant Chronicity:  New Dislocation: no   Tetanus status:  Unknown Relieved by:  Nothing Worsened by:  Nothing tried Ineffective treatments:  None tried Pt has been diagnosed with sciatica.  Pt reports medications are not working.  Pt reports she saw blood in her urine twice. Pt reports difficulty urinating (pt was able to give sample)  Pt concerned about bleeding from kidneys and her back sciatic pain  Past Medical History  Diagnosis Date  . Uterine fibroid    Past Surgical History  Procedure Laterality Date  . No past surgeries     No family history on file. History  Substance Use Topics  . Smoking status: Current Some Day Smoker -- 1.00 packs/day for 22 years    Types: Cigarettes  . Smokeless tobacco: Never Used  . Alcohol Use: Yes     Comment: none recent   OB History    No data available     Review of Systems  All other systems reviewed and are negative.     Allergies  Review of patient's allergies indicates no known allergies.  Home Medications   Prior to Admission medications   Medication Sig Start Date End Date Taking? Authorizing Provider  cyclobenzaprine (FLEXERIL) 10 MG tablet Take 1 tablet (10 mg total) by mouth 2 (two) times daily as needed for muscle spasms. 08/02/14  Yes Tiffany Carlota Raspberry, PA-C  ibuprofen  (ADVIL,MOTRIN) 200 MG tablet Take 200 mg by mouth every 6 (six) hours as needed for moderate pain.   Yes Historical Provider, MD  naproxen sodium (ANAPROX) 220 MG tablet Take 440 mg by mouth daily as needed (pain).   Yes Historical Provider, MD  oxycodone (OXY-IR) 5 MG capsule Take 5 mg by mouth every 4 (four) hours as needed for pain.   Yes Historical Provider, MD  predniSONE (DELTASONE) 10 MG tablet Take 4 tablets (40 mg total) by mouth daily. 08/02/14  Yes Tiffany Carlota Raspberry, PA-C  ibuprofen (ADVIL,MOTRIN) 800 MG tablet TAKE 1 TABLET EVERY 8 HOURS AS NEEDED. Patient not taking: Reported on 08/02/2014 02/22/13   Shelly Bombard, MD  methocarbamol (ROBAXIN-750) 750 MG tablet Take 1 tablet (750 mg total) by mouth 4 (four) times daily. Patient not taking: Reported on 08/02/2014 06/06/14   Lacretia Leigh, MD  oxyCODONE-acetaminophen (PERCOCET/ROXICET) 5-325 MG per tablet Take 1-2 tablets by mouth every 4 (four) hours as needed for severe pain. Patient not taking: Reported on 08/05/2014 06/06/14   Lacretia Leigh, MD  oxyCODONE-acetaminophen (PERCOCET/ROXICET) 5-325 MG per tablet Take 1-2 tablets by mouth every 6 (six) hours as needed. Patient not taking: Reported on 08/05/2014 08/02/14   Delos Haring, PA-C  predniSONE (DELTASONE) 10 MG tablet Take 2 tablets (20 mg total) by mouth daily. Patient not taking: Reported on 08/02/2014 06/06/14   Elberta Fortis  Zenia Resides, MD  traMADol (ULTRAM) 50 MG tablet Take 1 tablet (50 mg total) by mouth every 6 (six) hours as needed for pain. Patient not taking: Reported on 08/02/2014 02/22/13   Antonietta Breach, PA-C   BP 137/92 mmHg  Pulse 120  Temp(Src) 98.2 F (36.8 C) (Oral)  Resp 20  Ht 5\' 1"  (1.549 m)  Wt 218 lb (98.884 kg)  BMI 41.21 kg/m2  SpO2 98%  LMP 01/17/2012 Physical Exam  Constitutional: She appears well-developed and well-nourished.  HENT:  Head: Normocephalic.  Right Ear: External ear normal.  Left Ear: External ear normal.  Nose: Nose normal.  Mouth/Throat: Oropharynx is  clear and moist.  Eyes: Conjunctivae and EOM are normal. Pupils are equal, round, and reactive to light.  Neck: Normal range of motion. Neck supple.  Cardiovascular: Normal rate and normal heart sounds.   Pulmonary/Chest: Effort normal.  Abdominal: Soft.  Musculoskeletal: She exhibits tenderness.  Neurological: She is alert.  Skin: Skin is warm.  Psychiatric: She has a normal mood and affect.  Nursing note and vitals reviewed.   ED Course  Procedures (including critical care time) Labs Review Labs Reviewed  URINALYSIS, ROUTINE W REFLEX MICROSCOPIC - Abnormal; Notable for the following:    APPearance HAZY (*)    All other components within normal limits    Imaging Review Ct Renal Stone Study  08/05/2014   CLINICAL DATA:  Left leg pain since 12/15. Hematuria and dysuria yesterday.  EXAM: CT ABDOMEN AND PELVIS WITHOUT CONTRAST  TECHNIQUE: Multidetector CT imaging of the abdomen and pelvis was performed following the standard protocol without IV contrast.  COMPARISON:  None.  FINDINGS: Lower chest: Lung bases are unremarkable.  Upper abdomen: No focal abnormality identified within the liver, spleen, pancreas, adrenal glands. Gallbladder is present.  Kidneys are symmetric in size. No intrarenal or ureteral stones are identified.  Gastrointestinal tract: Stomach and small bowel loops are normal in appearance. The appendix is well seen and has a normal appearance. There is significant stool burden throughout nondilated loops of colon which are otherwise normal in appearance.  Pelvis: Uterus is absent.  No adnexal mass.  No free pelvic fluid.  Retroperitoneum: No retroperitoneal or mesenteric adenopathy.  Abdominal wall: Right flank subcutaneous gas, likely injection site.  Osseous structures: Unremarkable.  IMPRESSION: 1. Normal noncontrast appearance of the urinary tract. 2.  No evidence for acute abdominal or pelvic abnormality. 3. Moderate to large stool burden.   Electronically Signed   By:  Nolon Nations M.D.   On: 08/05/2014 21:37     EKG Interpretation None      MDM  ua no blood,  Ct no stones, kidneys normal.  Pt has no neurologic deficits.  Ct does show constipation.   Pt given rx for miralax.  Pt given ultram for pain.   Pt advised of need for follow up.   Pt feels better after ativan and torodol.    Final diagnoses:  Pain  Sciatica, left  Constipation, unspecified constipation type    Meds ordered this encounter  Medications  . HYDROmorphone (DILAUDID) injection 2 mg    Sig:   . ketorolac (TORADOL) injection 60 mg    Sig:   . LORazepam (ATIVAN) injection 2 mg    Sig:   . traMADol (ULTRAM) 50 MG tablet    Sig: Take 1 tablet (50 mg total) by mouth every 6 (six) hours as needed.    Dispense:  20 tablet    Refill:  0  Order Specific Question:  Supervising Provider    Answer:  Noemi Chapel [[id number]]  . polyethylene glycol (MIRALAX) packet    Sig: Take 17 g by mouth daily.    Dispense:  14 each    Refill:  0    Order Specific Question:  Supervising Provider    Answer:  Noemi Chapel [place], PA-C 08/06/14 1907  Pamella Pert, MD 08/07/14 [phone]

## 2014-08-05 NOTE — ED Notes (Signed)
Pt. Left with all belongings 

## 2014-08-05 NOTE — Discharge Instructions (Signed)
Constipation °Constipation is when a person has fewer than three bowel movements a week, has difficulty having a bowel movement, or has stools that are dry, hard, or larger than normal. As people grow older, constipation is more common. If you try to fix constipation with medicines that make you have a bowel movement (laxatives), the problem may get worse. Long-term laxative use may cause the muscles of the colon to become weak. A low-fiber diet, not taking in enough fluids, and taking certain medicines may make constipation worse.  °CAUSES  °· Certain medicines, such as antidepressants, pain medicine, iron supplements, antacids, and water pills.   °· Certain diseases, such as diabetes, irritable bowel syndrome (IBS), thyroid disease, or depression.   °· Not drinking enough water.   °· Not eating enough fiber-rich foods.   °· Stress or travel.   °· Lack of physical activity or exercise.   °· Ignoring the urge to have a bowel movement.   °· Using laxatives too much.   °SIGNS AND SYMPTOMS  °· Having fewer than three bowel movements a week.   °· Straining to have a bowel movement.   °· Having stools that are hard, dry, or larger than normal.   °· Feeling full or bloated.   °· Pain in the lower abdomen.   °· Not feeling relief after having a bowel movement.   °DIAGNOSIS  °Your health care provider will take a medical history and perform a physical exam. Further testing may be done for severe constipation. Some tests may include: °· A barium enema X-ray to examine your rectum, colon, and, sometimes, your small intestine.   °· A sigmoidoscopy to examine your lower colon.   °· A colonoscopy to examine your entire colon. °TREATMENT  °Treatment will depend on the severity of your constipation and what is causing it. Some dietary treatments include drinking more fluids and eating more fiber-rich foods. Lifestyle treatments may include regular exercise. If these diet and lifestyle recommendations do not help, your health care  provider may recommend taking over-the-counter laxative medicines to help you have bowel movements. Prescription medicines may be prescribed if over-the-counter medicines do not work.  °HOME CARE INSTRUCTIONS  °· Eat foods that have a lot of fiber, such as fruits, vegetables, whole grains, and beans. °· Limit foods high in fat and processed sugars, such as french fries, hamburgers, cookies, candies, and soda.   °· A fiber supplement may be added to your diet if you cannot get enough fiber from foods.   °· Drink enough fluids to keep your urine clear or pale yellow.   °· Exercise regularly or as directed by your health care provider.   °· Go to the restroom when you have the urge to go. Do not hold it.   °· Only take over-the-counter or prescription medicines as directed by your health care provider. Do not take other medicines for constipation without talking to your health care provider first.   °SEEK IMMEDIATE MEDICAL CARE IF:  °· You have bright red blood in your stool.   °· Your constipation lasts for more than 4 days or gets worse.   °· You have abdominal or rectal pain.   °· You have thin, pencil-like stools.   °· You have unexplained weight loss. °MAKE SURE YOU:  °· Understand these instructions. °· Will watch your condition. °· Will get help right away if you are not doing well or get worse. °Document Released: 01/16/2004 Document Revised: 04/24/2013 Document Reviewed: 01/29/2013 °[employer]® Patient Information ©2015 [employer]. This information is not intended to replace advice given to you by your health care provider. Make sure you discuss any questions   you have with your health care provider. Sciatica Sciatica is pain, weakness, numbness, or tingling along the path of the sciatic nerve. The nerve starts in the lower back and runs down the back of each leg. The nerve controls the muscles in the lower leg and in the back of the knee, while also providing sensation to the back of the thigh, lower leg,  and the sole of your foot. Sciatica is a symptom of another medical condition. For instance, nerve damage or certain conditions, such as a herniated disk or bone spur on the spine, pinch or put pressure on the sciatic nerve. This causes the pain, weakness, or other sensations normally associated with sciatica. Generally, sciatica only affects one side of the body. CAUSES   Herniated or slipped disc.  Degenerative disk disease.  A pain disorder involving the narrow muscle in the buttocks (piriformis syndrome).  Pelvic injury or fracture.  Pregnancy.  Tumor (rare). SYMPTOMS  Symptoms can vary from mild to very severe. The symptoms usually travel from the low back to the buttocks and down the back of the leg. Symptoms can include:  Mild tingling or dull aches in the lower back, leg, or hip.  Numbness in the back of the calf or sole of the foot.  Burning sensations in the lower back, leg, or hip.  Sharp pains in the lower back, leg, or hip.  Leg weakness.  Severe back pain inhibiting movement. These symptoms may get worse with coughing, sneezing, laughing, or prolonged sitting or standing. Also, being overweight may worsen symptoms. DIAGNOSIS  Your caregiver will perform a physical exam to look for common symptoms of sciatica. He or she may ask you to do certain movements or activities that would trigger sciatic nerve pain. Other tests may be performed to find the cause of the sciatica. These may include:  Blood tests.  X-rays.  Imaging tests, such as an MRI or CT scan. TREATMENT  Treatment is directed at the cause of the sciatic pain. Sometimes, treatment is not necessary and the pain and discomfort goes away on its own. If treatment is needed, your caregiver may suggest:  Over-the-counter medicines to relieve pain.  Prescription medicines, such as anti-inflammatory medicine, muscle relaxants, or narcotics.  Applying heat or ice to the painful area.  Steroid injections to  lessen pain, irritation, and inflammation around the nerve.  Reducing activity during periods of pain.  Exercising and stretching to strengthen your abdomen and improve flexibility of your spine. Your caregiver may suggest losing weight if the extra weight makes the back pain worse.  Physical therapy.  Surgery to eliminate what is pressing or pinching the nerve, such as a bone spur or part of a herniated disk. HOME CARE INSTRUCTIONS   Only take over-the-counter or prescription medicines for pain or discomfort as directed by your caregiver.  Apply ice to the affected area for 20 minutes, 3-4 times a day for the first 48-72 hours. Then try heat in the same way.  Exercise, stretch, or perform your usual activities if these do not aggravate your pain.  Attend physical therapy sessions as directed by your caregiver.  Keep all follow-up appointments as directed by your caregiver.  Do not wear high heels or shoes that do not provide proper support.  Check your mattress to see if it is too soft. A firm mattress may lessen your pain and discomfort. SEEK IMMEDIATE MEDICAL CARE IF:   You lose control of your bowel or bladder (incontinence).  You have  increasing weakness in the lower back, pelvis, buttocks, or legs.  You have redness or swelling of your back.  You have a burning sensation when you urinate.  You have pain that gets worse when you lie down or awakens you at night.  Your pain is worse than you have experienced in the past.  Your pain is lasting longer than 4 weeks.  You are suddenly losing weight without reason. MAKE SURE YOU:  Understand these instructions.  Will watch your condition.  Will get help right away if you are not doing well or get worse. Document Released: 04/13/2001 Document Revised: 10/19/2011 Document Reviewed: 08/29/2011 [facility] Patient Information 2015 [place], [place]. This information is not intended to replace advice given to you by your health  care provider. Make sure you discuss any questions you have with your health care provider.

## 2014-08-05 NOTE — ED Notes (Signed)
Pt c/o pain in left leg st's she was seen here on Fri for same and told she had sciatica.  Pt st's now left leg is numb and tingling.  Also c/o hematuria and not being able to empty her bladder and pain with urination

## 2014-08-06 ENCOUNTER — Telehealth: Payer: Self-pay | Admitting: *Deleted

## 2014-08-06 NOTE — Telephone Encounter (Signed)
Patient called in after recent trip to ED for sciatica.  Patient states the pain medication they gave her is not helping and she would like to establish care with a provider.  Patient was given an appointment to see Dr. Loma Messing on 08/13/14 at 11:15 patient was agreeable and address to clinic given to patient.

## 2014-08-13 ENCOUNTER — Ambulatory Visit: Payer: Self-pay | Attending: Family Medicine | Admitting: Family Medicine

## 2014-08-13 ENCOUNTER — Encounter: Payer: Self-pay | Admitting: Family Medicine

## 2014-08-13 VITALS — BP 126/91 | HR 87 | Temp 98.3°F | Resp 18 | Ht 61.0 in | Wt 207.0 lb

## 2014-08-13 DIAGNOSIS — M5442 Lumbago with sciatica, left side: Secondary | ICD-10-CM | POA: Insufficient documentation

## 2014-08-13 LAB — BASIC METABOLIC PANEL
BUN: 12 mg/dL (ref 6–23)
CO2: 24 mEq/L (ref 19–32)
Calcium: 9 mg/dL (ref 8.4–10.5)
Chloride: 103 mEq/L (ref 96–112)
Creat: 0.76 mg/dL (ref 0.50–1.10)
Glucose, Bld: 95 mg/dL (ref 70–99)
Potassium: 4.3 mEq/L (ref 3.5–5.3)
Sodium: 137 mEq/L (ref 135–145)

## 2014-08-13 MED ORDER — GABAPENTIN 300 MG PO CAPS: 300.0000 mg | ORAL_CAPSULE | Freq: Every day | ORAL | Status: DC

## 2014-08-13 MED ORDER — DULOXETINE HCL 30 MG PO CPEP: 30.0000 mg | ORAL_CAPSULE | Freq: Every day | ORAL | Status: DC

## 2014-08-13 NOTE — Assessment & Plan Note (Signed)
Back pain: I suspect sciatica  Gabapentin 300 mg at night. Cymbalta 30 mg during the day. Drink tart cherry juice which is a natural antiinflammatory   MRI ordered with plan for referrals to either: PT, ortho, back surgery  If possible please do the following exercises: twice daily

## 2014-08-13 NOTE — Progress Notes (Signed)
Establish Care Complaining of back pain left leg and foot x month  No Hx injury  Left foot feeling numb

## 2014-08-13 NOTE — Patient Instructions (Signed)
Madison [name],  Thank you for coming in today. It was a pleasure meeting you. I look forward to being your primary doctor.  1. Back pain: I suspect sciatica  Gabapentin 300 mg at night. Cymbalta 30 mg during the day. Drink tart cherry juice which is a natural antiinflammatory   MRI ordered with plan for referrals to either: PT, ortho, back surgery  If possible please do the following exercises: twice daily   You will be called with lab results  F/u in 6 weeks for back pain   Dr. Adrian Blackwater  Back Exercises These exercises may help you when beginning to rehabilitate your injury. Your symptoms may resolve with or without further involvement from your physician, physical therapist or athletic trainer. While completing these exercises, remember:   Restoring tissue flexibility helps normal motion to return to the joints. This allows healthier, less painful movement and activity.  An effective stretch should be held for at least 30 seconds.  A stretch should never be painful. You should only feel a gentle lengthening or release in the stretched tissue. STRETCH - Extension, Prone on Elbows   Lie on your stomach on the floor, a bed will be too soft. Place your palms about shoulder width apart and at the height of your head.  Place your elbows under your shoulders. If this is too painful, stack pillows under your chest.  Allow your body to relax so that your hips drop lower and make contact more completely with the floor.  Hold this position for __________ seconds.  Slowly return to lying flat on the floor. Repeat __________ times. Complete this exercise __________ times per day.  RANGE OF MOTION - Extension, Prone Press Ups   Lie on your stomach on the floor, a bed will be too soft. Place your palms about shoulder width apart and at the height of your head.  Keeping your back as relaxed as possible, slowly straighten your elbows while keeping your hips on the floor. You may adjust the  placement of your hands to maximize your comfort. As you gain motion, your hands will come more underneath your shoulders.  Hold this position __________ seconds.  Slowly return to lying flat on the floor. Repeat __________ times. Complete this exercise __________ times per day.  RANGE OF MOTION- Quadruped, Neutral Spine   Assume a hands and knees position on a firm surface. Keep your hands under your shoulders and your knees under your hips. You may place padding under your knees for comfort.  Drop your head and point your tail bone toward the ground below you. This will round out your low back like an angry cat. Hold this position for __________ seconds.  Slowly lift your head and release your tail bone so that your back sags into a large arch, like an old horse.  Hold this position for __________ seconds.  Repeat this until you feel limber in your low back.  Now, find your "sweet spot." This will be the most comfortable position somewhere between the two previous positions. This is your neutral spine. Once you have found this position, tense your stomach muscles to support your low back.  Hold this position for __________ seconds. Repeat __________ times. Complete this exercise __________ times per day.  STRETCH - Flexion, Single Knee to Chest   Lie on a firm bed or floor with both legs extended in front of you.  Keeping one leg in contact with the floor, bring your opposite knee to your chest. Hold  your leg in place by either grabbing behind your thigh or at your knee.  Pull until you feel a gentle stretch in your low back. Hold __________ seconds.  Slowly release your grasp and repeat the exercise with the opposite side. Repeat __________ times. Complete this exercise __________ times per day.  STRETCH - Hamstrings, Standing  Stand or sit and extend your right / left leg, placing your foot on a chair or foot stool  Keeping a slight arch in your low back and your hips straight  forward.  Lead with your chest and lean forward at the waist until you feel a gentle stretch in the back of your right / left knee or thigh. (When done correctly, this exercise requires leaning only a small distance.)  Hold this position for __________ seconds. Repeat __________ times. Complete this stretch __________ times per day. STRENGTHENING - Deep Abdominals, Pelvic Tilt   Lie on a firm bed or floor. Keeping your legs in front of you, bend your knees so they are both pointed toward the ceiling and your feet are flat on the floor.  Tense your lower abdominal muscles to press your low back into the floor. This motion will rotate your pelvis so that your tail bone is scooping upwards rather than pointing at your feet or into the floor.  With a gentle tension and even breathing, hold this position for __________ seconds. Repeat __________ times. Complete this exercise __________ times per day.  STRENGTHENING - Abdominals, Crunches   Lie on a firm bed or floor. Keeping your legs in front of you, bend your knees so they are both pointed toward the ceiling and your feet are flat on the floor. Cross your arms over your chest.  Slightly tip your chin down without bending your neck.  Tense your abdominals and slowly lift your trunk high enough to just clear your shoulder blades. Lifting higher can put excessive stress on the low back and does not further strengthen your abdominal muscles.  Control your return to the starting position. Repeat __________ times. Complete this exercise __________ times per day.  STRENGTHENING - Quadruped, Opposite UE/LE Lift   Assume a hands and knees position on a firm surface. Keep your hands under your shoulders and your knees under your hips. You may place padding under your knees for comfort.  Find your neutral spine and gently tense your abdominal muscles so that you can maintain this position. Your shoulders and hips should form a rectangle that is  parallel with the floor and is not twisted.  Keeping your trunk steady, lift your right hand no higher than your shoulder and then your left leg no higher than your hip. Make sure you are not holding your breath. Hold this position __________ seconds.  Continuing to keep your abdominal muscles tense and your back steady, slowly return to your starting position. Repeat with the opposite arm and leg. Repeat __________ times. Complete this exercise __________ times per day. Document Released: 05/07/2005 Document Revised: 07/12/2011 Document Reviewed: 08/01/2008 [facility] Patient Information 2015 [place], [place]. This information is not intended to replace advice given to you by your health care provider. Make sure you discuss any questions you have with your health care provider.

## 2014-08-13 NOTE — Progress Notes (Signed)
   Subjective:    Patient ID: Madison [name], female    DOB: May 02, 1976, 39 y.o.   MRN: [id number]  HPI BACK PAIN  Location: L low back pain  Quality: stabbing, electricity Onset: 4 months, worsening over past 2 months  Worse with: standing, walking Better with: compression on back/laying on the L sideRadiation: down L leg  Trauma: none  Best sitting/standing/leaning forward: lying on L side  Red Flags Fecal/urinary incontinence: no  Numbness/Weakness: yes, L side   Fever/chills/sweats: no  Night pain: yes  Unexplained weight loss: no  No relief with bedrest: no  h/o cancer/immunosuppression: no  IV drug use: no  PMH of osteoporosis or chronic steroid use: no   Soc Hx: current smoker Med Hx: menorrhagia Surg Hx: s/p hysterectomy   Review of Systems As per HPI     Objective:   Physical Exam BP 126/91 mmHg  Pulse 87  Temp(Src) 98.3 F (36.8 C) (Oral)  Resp 18  Ht 5\' 1"  (1.549 m)  Wt 207 lb (93.895 kg)  BMI 39.13 kg/m2  SpO2 96%  LMP 01/17/2012  Wt Readings from Last 3 Encounters:  08/13/14 207 lb (93.895 kg)  08/05/14 218 lb (98.884 kg)  08/02/14 218 lb (98.884 kg)   General appearance: alert, cooperative and no distress  Lungs: clear to auscultation bilaterally Heart: regular rate and rhythm, S1, S2 normal, no murmur, click, rub or gallop Abdomen: soft, non-tender; bowel sounds normal; no masses,  no organomegaly  Skin: warm, dry, intact  Ext: no edema  Back Exam: Back: Normal Curvature, no deformities or CVA tenderness  Paraspinal Tenderness: L sacral  LE Strength 5/5  LE Sensation: in tact  LE Reflexes 1+ and symmetric  Straight leg raise: + on L        Assessment & Plan:

## 2014-08-16 ENCOUNTER — Telehealth: Payer: Self-pay | Admitting: *Deleted

## 2014-08-16 NOTE — Telephone Encounter (Signed)
Patient called in asking about her medications given last visit(Cymbalta and Gabapentin).  She was unsure why she was taking an antidepressant for her pain.  I explained that Cymbalta has also been proven to help people who have chronic pain.  Patient states she is having such awful pain that she cannot stand it any longer.  I advised her that Dr. Adrian Blackwater was out of the clinic today and that if her pain was severe that she should go to urgent care of an emergency room.

## 2014-08-19 ENCOUNTER — Telehealth: Payer: Self-pay | Admitting: *Deleted

## 2014-08-19 NOTE — Telephone Encounter (Signed)
Pt aware of results 

## 2014-08-19 NOTE — Telephone Encounter (Signed)
-----   Message from Boykin Nearing, MD sent at 08/14/2014  8:54 AM EDT ----- Normal BMP

## 2014-09-02 ENCOUNTER — Ambulatory Visit (HOSPITAL_COMMUNITY): Admission: RE | Admit: 2014-09-02 | Payer: Self-pay | Source: Ambulatory Visit

## 2014-09-03 ENCOUNTER — Telehealth: Payer: Self-pay | Admitting: *Deleted

## 2014-09-03 DIAGNOSIS — M5442 Lumbago with sciatica, left side: Secondary | ICD-10-CM

## 2014-09-03 NOTE — Telephone Encounter (Signed)
Patient called in saying she is unable to perform her job duties due to her back and sciatica pain.  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  She is in the process of applying for assistance with bills etc through [employer].  She is calling to ask for a letter from her PCP saying that she, with her condition, is unable to work at this time.  She is also complaining of symptoms of dry mouth and drowsiness related to her medication. I told her she might want to try Biotene for her mouth dryness and that the drowsiness can be caused by the Gabapentin and that should get better with time.  I will forward this to PCP and social worker for follow up.

## 2014-09-04 ENCOUNTER — Ambulatory Visit: Payer: Medicaid Other | Attending: Family Medicine | Admitting: Clinical

## 2014-09-04 ENCOUNTER — Encounter: Payer: Self-pay | Admitting: *Deleted

## 2014-09-04 DIAGNOSIS — Z658 Other specified problems related to psychosocial circumstances: Secondary | ICD-10-CM

## 2014-09-04 NOTE — Progress Notes (Signed)
ASSESSMENT: Pt experiencing symptoms of stress related to financial, housing, and other psychosocial circumstances. She needs to continue to take medications as prescribed, F/U with PCP and financial counselor at [facility]. She would benefit from F/U with [facility], as well as utilizing additional community resources. Stage of Change: action  PT AGREES TO FOLLOWING TREATMENT PLAN: 1. F/U with behavioral health consultant as needed. 2. Psychiatric Medications: Neurontin, continue to take as prescribed 3. Behavioral recommendation(s):   -Contact list of utility resources given -Go to [employer] for initial intake on 09-05-14 to utilize services(mail, phone, laundry, computer lab to order safelink phone) -Consider contacting Housing Hotline at [phone] for help with housing -Consider making an appointment for resource counseling at [facility] with disability claim -Consider obtaining free [place] bus ID for reduced fare rides when orange card comes in   SUBJECTIVE: Pt. referred by Shauna Hugh, financial counseling for help coping with psychosocial circumstances  Pt. here for  REFERRAL regarding coping with psychosocial circumstances Pt. reports the following symptoms/concerns: Patient states that her health condition has prevented her from being able to work for the past three months; her 91yo son has left school to help take care of her, and she feels badly about that. She does not have stable housing, has received a section 8 voucher, has found a home, but is unable to pay for the deposit to move in. She does not have a stable place to receive mail, will have her phone cut off in a couple of days, her food stamps have been cut off when her child turned 72, and has to rely on others for transportation.  Duration of problem: three months Severity: mild  OBJECTIVE: Orientation & Cognition: Oriented x3. Thought processes normal and appropriate to situation. Mood:  distressed Affect: appropriate Appearance: appropriate Risk of harm to self or others: no risk of harm to self or others Substance use: alcohol Psychiatric medication use: Unchanged from prior contact. Assessments administered: none  Diagnosis: Other problems related to psychosocial circumstances CPT Code: G387 -------------------------------------------- Other(s) present in the room: none   Time spent with patient in exam room: 20 min

## 2014-09-09 NOTE — Telephone Encounter (Signed)
Please inform patient that letter is written. Letter is very specific regarding restrictions and time off date.  Patient needs f/u appt with me, 6 weeks from last OV, which will be around 09/17/14.  If I determine that more time off is needed at f/u, I will provide another letter.   I have referred patient to PT.

## 2014-09-09 NOTE — Telephone Encounter (Signed)
Pt aware. Call transfer to front office to schedule appointment

## 2014-09-09 NOTE — Addendum Note (Signed)
Addended by: Boykin Nearing on: 09/09/2014 09:06 AM   Modules accepted: Orders

## 2014-09-12 ENCOUNTER — Encounter: Payer: Self-pay | Admitting: Family Medicine

## 2014-09-12 ENCOUNTER — Ambulatory Visit: Payer: No Typology Code available for payment source | Attending: Family Medicine | Admitting: Family Medicine

## 2014-09-12 VITALS — BP 113/82 | HR 91 | Temp 98.3°F | Resp 18 | Ht 61.0 in | Wt 204.0 lb

## 2014-09-12 DIAGNOSIS — E669 Obesity, unspecified: Secondary | ICD-10-CM

## 2014-09-12 DIAGNOSIS — F17200 Nicotine dependence, unspecified, uncomplicated: Secondary | ICD-10-CM | POA: Insufficient documentation

## 2014-09-12 DIAGNOSIS — Z720 Tobacco use: Secondary | ICD-10-CM

## 2014-09-12 DIAGNOSIS — Z6841 Body Mass Index (BMI) 40.0 and over, adult: Secondary | ICD-10-CM | POA: Insufficient documentation

## 2014-09-12 DIAGNOSIS — Z6838 Body mass index (BMI) 38.0-38.9, adult: Secondary | ICD-10-CM | POA: Insufficient documentation

## 2014-09-12 DIAGNOSIS — M5442 Lumbago with sciatica, left side: Secondary | ICD-10-CM | POA: Insufficient documentation

## 2014-09-12 MED ORDER — KETOROLAC TROMETHAMINE 60 MG/2ML IM SOLN
60.0000 mg | Freq: Once | INTRAMUSCULAR | Status: AC
Start: 2014-09-12 — End: 2014-09-12
  Administered 2014-09-12: 60 mg via INTRAMUSCULAR

## 2014-09-12 MED ORDER — CYCLOBENZAPRINE HCL 10 MG PO TABS: 10.0000 mg | ORAL_TABLET | Freq: Every day | ORAL | Status: DC

## 2014-09-12 MED ORDER — METHYLPREDNISOLONE 4 MG PO TBPK: ORAL_TABLET | ORAL | Status: DC

## 2014-09-12 NOTE — Assessment & Plan Note (Signed)
A: losing weight and feels good about it P: Congratulated weight loss

## 2014-09-12 NOTE — Patient Instructions (Addendum)
Ms. Cieanna, Storm[employer] job with weight loss  1. Low back pain with sciatica: unchanged with pain and numbness  Flexeril at bedtime Medrol (steroid) dose pack MRI  2. ? Orange card: based on the chart it appears that you have the orange card. Check up front about seeing a financial advisor to get the physical card.   F/u in 6 weeks or sooner if needed  Dr. Adrian Blackwater

## 2014-09-12 NOTE — Progress Notes (Signed)
Complaining of back pain running to lt leg Stated from lt  knee down numbness

## 2014-09-12 NOTE — Assessment & Plan Note (Signed)
Low back pain with sciatica: unchanged with pain and numbness  Flexeril at bedtime Medrol (steroid) dose pack MRI

## 2014-09-12 NOTE — Progress Notes (Signed)
   Subjective:    Patient ID: Madison [name], female    DOB: 1975/10/12, 39 y.o.   MRN: [id number] CC: low back pain with sciatica  HPI  1. L low back pain with sciatica: still with pain radiating down L leg. No incontinence. There is numbness. No leg weakness. Lost job due to pain. Evicted from home due to inability to pay rent.   Soc Hx: current smoker  Review of Systems  Constitutional: Negative for fever, chills and unexpected weight change.  Genitourinary: Negative for urgency and frequency.  Musculoskeletal: Positive for back pain.  Neurological: Positive for numbness. Negative for weakness.      Objective:   Physical Exam BP 113/82 mmHg  Pulse 91  Temp(Src) 98.3 F (36.8 C) (Oral)  Resp 18  Ht 5\' 1"  (1.549 m)  Wt 204 lb (92.534 kg)  BMI 38.57 kg/m2  SpO2 96%  LMP 01/17/2012  Wt Readings from Last 3 Encounters:  09/12/14 204 lb (92.534 kg)  08/13/14 207 lb (93.895 kg)  08/05/14 218 lb (98.884 kg)  General appearance: alert, cooperative and no distress  Back Exam: Back: Normal Curvature, no deformities or CVA tenderness  Paraspinal Tenderness: L lumbar and gluteal   LE Strength 5/5  LE Sensation: in tact  LE Reflexes 2+ and symmetric  Straight leg raise: positive on L         Assessment & Plan:

## 2014-09-24 ENCOUNTER — Ambulatory Visit (HOSPITAL_COMMUNITY)
Admission: RE | Admit: 2014-09-24 | Discharge: 2014-09-24 | Disposition: A | Discharge status: Home / Self Care | Payer: Medicaid Other | Source: Ambulatory Visit | Attending: Family Medicine | Admitting: Family Medicine

## 2014-09-24 ENCOUNTER — Ambulatory Visit: Payer: No Typology Code available for payment source | Admitting: Physical Therapy

## 2014-09-24 DIAGNOSIS — M5442 Lumbago with sciatica, left side: Secondary | ICD-10-CM

## 2014-09-24 DIAGNOSIS — M5127 Other intervertebral disc displacement, lumbosacral region: Secondary | ICD-10-CM | POA: Insufficient documentation

## 2014-09-25 ENCOUNTER — Other Ambulatory Visit: Payer: Self-pay | Admitting: Family Medicine

## 2014-09-25 DIAGNOSIS — M5442 Lumbago with sciatica, left side: Secondary | ICD-10-CM

## 2014-09-25 NOTE — Assessment & Plan Note (Signed)
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

## 2014-10-03 ENCOUNTER — Telehealth: Payer: Self-pay | Admitting: Family Medicine

## 2014-10-03 DIAGNOSIS — M5442 Lumbago with sciatica, left side: Secondary | ICD-10-CM

## 2014-10-03 NOTE — Telephone Encounter (Signed)
Pt called requesting MRI results, patient states her pain has not got any better and is having numbness in feet .  Please f/u with patient

## 2014-10-07 ENCOUNTER — Other Ambulatory Visit: Payer: Self-pay | Admitting: Family Medicine

## 2014-10-07 ENCOUNTER — Telehealth: Payer: Self-pay | Admitting: Family Medicine

## 2014-10-07 DIAGNOSIS — M5442 Lumbago with sciatica, left side: Secondary | ICD-10-CM

## 2014-10-07 MED ORDER — GABAPENTIN 300 MG PO CAPS: ORAL_CAPSULE | ORAL | Status: DC

## 2014-10-07 MED ORDER — GABAPENTIN 300 MG PO CAPS: 300.0000 mg | ORAL_CAPSULE | Freq: Three times a day (TID) | ORAL | Status: DC

## 2014-10-07 NOTE — Telephone Encounter (Signed)
Patient is returning phone call from PCP, please f/u with pt.

## 2014-10-07 NOTE — Telephone Encounter (Signed)
Called back and left VM requesting a call back.   Called back to patient to give MRI results of low back, discuss plan and discuss new medication for foot numbness, gabapentin. Medication ordered Patient referred to neurosurgery for herniated disk.

## 2014-10-07 NOTE — Telephone Encounter (Signed)
Called patient back. Gave MRI results and f/u plan.  Plan is neurosurgery referral and gabapentin.

## 2014-10-08 ENCOUNTER — Ambulatory Visit: Payer: No Typology Code available for payment source | Attending: Family Medicine | Admitting: Physical Therapy

## 2014-10-11 ENCOUNTER — Ambulatory Visit: Payer: No Typology Code available for payment source | Admitting: Family Medicine

## 2014-10-23 ENCOUNTER — Telehealth: Payer: Self-pay | Admitting: Family Medicine

## 2014-10-23 DIAGNOSIS — M5442 Lumbago with sciatica, left side: Secondary | ICD-10-CM

## 2014-10-23 MED ORDER — GABAPENTIN 300 MG PO CAPS: 600.0000 mg | ORAL_CAPSULE | Freq: Three times a day (TID) | ORAL | Status: DC

## 2014-10-23 NOTE — Telephone Encounter (Signed)
Pt calling to know further instructions in care. Pt states she is having difficulty completing daily tasks such as dressing due to pain. Please f/u with pt.

## 2014-10-23 NOTE — Telephone Encounter (Signed)
Please inform patient that in addition to PT and neurosurgery I have placed a referral to pain management  Patient should apply for medicaid if she has not already done so and for [place] discount and orange card.  In meantime, please increase gabapentin to 600 mg TID I would ike to prescribe a stronger pain med as well can patient tolerate tylenol #3 or tramadol? If so I will prescribe one of the two and place it up front for pick up.

## 2014-10-24 ENCOUNTER — Telehealth: Payer: Self-pay | Admitting: Family Medicine

## 2014-10-24 DIAGNOSIS — M5442 Lumbago with sciatica, left side: Secondary | ICD-10-CM

## 2014-10-24 MED ORDER — DICLOFENAC SODIUM 75 MG PO TBEC: 75.0000 mg | DELAYED_RELEASE_TABLET | Freq: Two times a day (BID) | ORAL | Status: DC

## 2014-10-24 NOTE — Telephone Encounter (Signed)
Madison [name] spoke to patient today Gave f/u recommendations Patient declined both tramadol and tylenol #3    I called back to patient She is having a lot of pain. Has not done medrol dosepak, applying for disability. Has not re-applied for medicaid recently  Patient advised to pick up and take medrol dose pak first, followed by  diclofenac with food,  She will increase gabapentin to 600 mg TID

## 2014-10-24 NOTE — Telephone Encounter (Signed)
Pt aware  Stated Tylenol #3 and tramadol do not work for her

## 2017-03-22 ENCOUNTER — Encounter: Payer: Self-pay | Admitting: Family Medicine

## 2017-03-22 ENCOUNTER — Ambulatory Visit: Payer: Self-pay | Attending: Family Medicine | Admitting: Family Medicine

## 2017-03-22 VITALS — BP 136/90 | HR 82 | Temp 98.1°F | Resp 18 | Ht 61.0 in | Wt 242.4 lb

## 2017-03-22 DIAGNOSIS — M542 Cervicalgia: Secondary | ICD-10-CM | POA: Insufficient documentation

## 2017-03-22 DIAGNOSIS — M79605 Pain in left leg: Secondary | ICD-10-CM | POA: Insufficient documentation

## 2017-03-22 DIAGNOSIS — M549 Dorsalgia, unspecified: Secondary | ICD-10-CM

## 2017-03-22 DIAGNOSIS — M5442 Lumbago with sciatica, left side: Secondary | ICD-10-CM | POA: Insufficient documentation

## 2017-03-22 DIAGNOSIS — Z8739 Personal history of other diseases of the musculoskeletal system and connective tissue: Secondary | ICD-10-CM

## 2017-03-22 DIAGNOSIS — R296 Repeated falls: Secondary | ICD-10-CM | POA: Insufficient documentation

## 2017-03-22 DIAGNOSIS — L409 Psoriasis, unspecified: Secondary | ICD-10-CM

## 2017-03-22 DIAGNOSIS — E669 Obesity, unspecified: Secondary | ICD-10-CM

## 2017-03-22 DIAGNOSIS — M25552 Pain in left hip: Secondary | ICD-10-CM | POA: Insufficient documentation

## 2017-03-22 DIAGNOSIS — R29898 Other symptoms and signs involving the musculoskeletal system: Secondary | ICD-10-CM

## 2017-03-22 DIAGNOSIS — R21 Rash and other nonspecific skin eruption: Secondary | ICD-10-CM | POA: Insufficient documentation

## 2017-03-22 DIAGNOSIS — G8929 Other chronic pain: Secondary | ICD-10-CM

## 2017-03-22 DIAGNOSIS — Z79899 Other long term (current) drug therapy: Secondary | ICD-10-CM | POA: Insufficient documentation

## 2017-03-22 MED ORDER — TRAMADOL HCL 50 MG PO TABS: 50.0000 mg | ORAL_TABLET | Freq: Two times a day (BID) | ORAL | 0 refills | Status: DC | PRN

## 2017-03-22 MED ORDER — NAPROXEN 500 MG PO TABS: 500.0000 mg | ORAL_TABLET | Freq: Two times a day (BID) | ORAL | 0 refills | Status: DC

## 2017-03-22 MED ORDER — CLOBETASOL PROPIONATE 0.05 % EX CREA: 1.0000 "application " | TOPICAL_CREAM | Freq: Two times a day (BID) | CUTANEOUS | 0 refills | Status: DC

## 2017-03-22 MED FILL — ?CLOBETASOL PROPIONA 0.05: 0.05 | 60 days supply | Qty: 60 | Fill #0

## 2017-03-22 MED FILL — NAPROXEN 500 MG TABLET: 500 | 30 days supply | Qty: 60 | Fill #0

## 2017-03-22 MED FILL — traMADol HCL 50 MG TABS: 50 | 30 days supply | Qty: 60 | Fill #0

## 2017-03-22 NOTE — Patient Instructions (Addendum)
Apply for orange card and discount program.  Back Pain, Adult Back pain is very common in adults.The cause of back pain is rarely dangerous and the pain often gets better over time.The cause of your back pain may not be known. Some common causes of back pain include:  Strain of the muscles or ligaments supporting the spine.  Wear and tear (degeneration) of the spinal disks.  Arthritis.  Direct injury to the back.  For many people, back pain may return. Since back pain is rarely dangerous, most people can learn to manage this condition on their own. Follow these instructions at home: Watch your back pain for any changes. The following actions may help to lessen any discomfort you are feeling:  Remain active. It is stressful on your back to sit or stand in one place for long periods of time. Do not sit, drive, or stand in one place for more than 30 minutes at a time. Take short walks on even surfaces as soon as you are able.Try to increase the length of time you walk each day.  Exercise regularly as directed by your health care provider. Exercise helps your back heal faster. It also helps avoid future injury by keeping your muscles strong and flexible.  Do not stay in bed.Resting more than 1-2 days can delay your recovery.  Pay attention to your body when you bend and lift. The most comfortable positions are those that put less stress on your recovering back. Always use proper lifting techniques, including: ? Bending your knees. ? Keeping the load close to your body. ? Avoiding twisting.  Find a comfortable position to sleep. Use a firm mattress and lie on your side with your knees slightly bent. If you lie on your back, put a pillow under your knees.  Avoid feeling anxious or stressed.Stress increases muscle tension and can worsen back pain.It is important to recognize when you are anxious or stressed and learn ways to manage it, such as with exercise.  Take medicines only as  directed by your health care provider. Over-the-counter medicines to reduce pain and inflammation are often the most helpful.Your health care provider may prescribe muscle relaxant drugs.These medicines help dull your pain so you can more quickly return to your normal activities and healthy exercise.  Apply ice to the injured area: ? Put ice in a plastic bag. ? Place a towel between your skin and the bag. ? Leave the ice on for 20 minutes, 2-3 times a day for the first 2-3 days. After that, ice and heat may be alternated to reduce pain and spasms.  Maintain a healthy weight. Excess weight puts extra stress on your back and makes it difficult to maintain good posture.  Contact a health care provider if:  You have pain that is not relieved with rest or medicine.  You have increasing pain going down into the legs or buttocks.  You have pain that does not improve in one week.  You have night pain.  You lose weight.  You have a fever or chills. Get help right away if:  You develop new bowel or bladder control problems.  You have unusual weakness or numbness in your arms or legs.  You develop nausea or vomiting.  You develop abdominal pain.  You feel faint. This information is not intended to replace advice given to you by your health care provider. Make sure you discuss any questions you have with your health care provider. Document Released: 04/19/2005 Document Revised: 08/28/2015  Document Reviewed: 08/21/2013 Elsevier Interactive Patient Education  2017 Elsevier Inc[employer]   Preventing Unhealthy [employer], Adult Staying at a healthy weight is important. When fat builds up in your body, you may become overweight or obese. These conditions put you at greater risk for developing certain health problems, such as heart disease, diabetes, sleeping problems, joint problems, and some cancers. Unhealthy weight gain is often the result of making unhealthy choices in what you eat. It is also  a result of not getting enough exercise. You can make changes to your lifestyle to prevent obesity and stay as healthy as possible. What nutrition changes can be made? To maintain a healthy weight and prevent obesity:  Eat only as much as your body needs. To do this: ? Pay attention to signs that you are hungry or full. Stop eating as soon as you feel full. ? If you feel hungry, try drinking water first. Drink enough water so your urine is clear or pale yellow. ? Eat smaller portions. ? Look at serving sizes on food labels. Most foods contain more than one serving per container. ? Eat the recommended amount of calories for your gender and activity level. While most active people should eat around 2,000 calories per day, if you are trying to lose weight or are not very active, you main need to eat less calories. Talk to your health care provider or dietitian about how many calories you should eat each day.  Choose healthy foods, such as: ? Fruits and vegetables. Try to fill at least half of your plate at each meal with fruits and vegetables. ? Whole grains, such as whole wheat bread, brown rice, and quinoa. ? Lean meats, such as chicken or fish. ? Other healthy proteins, such as beans, eggs, or tofu. ? Healthy fats, such as nuts, seeds, fatty fish, and olive oil. ? Low-fat or fat-free dairy.  Check food labels and avoid food and drinks that: ? Are high in calories. ? Have added sugar. ? Are high in sodium. ? Have saturated fats or trans fats.  Limit how much you eat of the following foods: ? Prepackaged meals. ? Fast food. ? Fried foods. ? Processed meat, such as bacon, sausage, and deli meats. ? Fatty cuts of red meat and poultry with skin.  Cook foods in healthier ways, such as by baking, broiling, or grilling.  When grocery shopping, try to shop around the outside of the store. This helps you buy mostly fresh foods and avoid canned and prepackaged foods.  What lifestyle changes  can be made?  Exercise at least 30 minutes 5 or more days each week. Exercising includes brisk walking, yard work, biking, running, swimming, and team sports like basketball and soccer. Ask your health care provider which exercises are safe for you.  Do not use any products that contain nicotine or tobacco, such as cigarettes and e-cigarettes. If you need help quitting, ask your health care provider.  Limit alcohol intake to no more than 1 drink a day for nonpregnant women and 2 drinks a day for men. One drink equals 12 oz of beer, 5 oz of wine, or 1 oz of hard liquor.  Try to get 7-9 hours of sleep each night. What other changes can be made?  Keep a food and activity journal to keep track of: ? What you ate and how many calories you had. Remember to count sauces, dressings, and side dishes. ? Whether you were active, and what exercises you did. ?  Your calorie, weight, and activity goals.  Check your weight regularly. Track any changes. If you notice you have gained weight, make changes to your diet or activity routine.  Avoid taking weight-loss medicines or supplements. Talk to your health care provider before starting any new medicine or supplement.  Talk to your health care provider before trying any new diet or exercise plan. Why are these changes important? Eating healthy, staying active, and having healthy habits not only help prevent obesity, they also:  Help you to manage stress and emotions.  Help you to connect with friends and family.  Improve your self-esteem.  Improve your sleep.  Prevent long-term health problems.  What can happen if changes are not made? Being obese or overweight can cause you to develop joint or bone problems, which can make it hard for you to stay active or do activities you enjoy. Being obese or overweight also puts stress on your heart and lungs and can lead to health problems like diabetes, heart disease, and some cancers. Where to find more  information: Talk with your health care provider or a dietitian about healthy eating and healthy lifestyle choices. You may also find other information through these resources:  U.[place]. Department of Agriculture MyPlate: FormerBoss.no  American Heart Association: www.heart.org  Centers for Disease Control and Prevention: http://www.wolf.info/  Summary  Staying at a healthy weight is important. It helps prevent certain diseases and health problems, such as heart disease, diabetes, joint problems, sleep disorders, and some cancers.  Being obese or overweight can cause you to develop joint or bone problems, which can make it hard for you to stay active or do activities you enjoy.  You can prevent unhealthy weight gain by eating a healthy diet, exercising regularly, not smoking, limiting alcohol, and getting enough sleep.  Talk with your health care provider or a dietitian for guidance about healthy eating and healthy lifestyle choices. This information is not intended to replace advice given to you by your health care provider. Make sure you discuss any questions you have with your health care provider. Document Released: 04/20/2016 Document Revised: 05/26/2016 Document Reviewed: 05/26/2016 Elsevier Interactive Patient Education  [employer].

## 2017-03-22 NOTE — Progress Notes (Signed)
Subjective:  Patient ID: Madison [name], female    DOB: Jul 19, 1975  Age: 41 y.o. MRN: [id number]  CC: Leg Pain and Hip Pain   HPI TAWYNA PELLOT presents for complaints of back pain, neck pain, and rash.  Back and neck pain: Onset over 2 years ago.  Symptoms include aching, burning pain.  Pain extends from lower back to left lower extremity.  Associated symptoms include paresthesias, and limited range of motion.  She reports pain at worst is 10 out of 10.  Reports episode of pain that caused and n/v over week ago.  She reports episodes of falling due to low back pain in the past.  She denies any bowel or bladder incontinence.  Neck pain associated symptoms include limited range of motion of shoulder and arm.  She denies any paresthesias.  She reports taking Aleve 2 tablets over-the-counter 3 times a day for pain.  History of herniated disc in the past.  She reports not following up with specialist due to lack of health insurance.  Rash: Onset 2 years ago.  Located to right upper arm and right lower leg.  Associated symptoms include pruritus, scaling.  She reports not seeing a dermatologist in the past.  She reports using a friend's prescription cream to help with her symptoms.    Outpatient Medications Prior to Visit  Medication Sig Dispense Refill  . cyclobenzaprine (FLEXERIL) 10 MG tablet Take 1 tablet (10 mg total) by mouth at bedtime. (Patient not taking: Reported on 03/22/2017) 30 tablet 2  . diclofenac (VOLTAREN) 75 MG EC tablet Take 1 tablet (75 mg total) by mouth 2 (two) times daily with a meal. (Patient not taking: Reported on 03/22/2017) 60 tablet 2  . gabapentin (NEURONTIN) 300 MG capsule Take 2 capsules (600 mg total) by mouth 3 (three) times daily. (Patient not taking: Reported on 03/22/2017) 180 capsule 3  . methylPREDNISolone (MEDROL DOSEPAK) 4 MG TBPK tablet Taper per packet instructions (Patient not taking: Reported on 03/22/2017) 21 tablet 0   No facility-administered  medications prior to visit.     ROS Review of Systems  Constitutional: Negative.   Respiratory: Negative.   Cardiovascular: Negative.   Genitourinary: Negative.   Musculoskeletal: Positive for back pain, myalgias and neck pain.  Skin: Positive for rash.  Neurological:       Paresthesias  Psychiatric/Behavioral: Negative for suicidal ideas.       Objective:  BP 136/90 (BP Location: Left Arm, Patient Position: Sitting, Cuff Size: Normal)   Pulse 82   Temp 98.1 F (36.7 C) (Oral)   Resp 18   Ht 5\' 1"  (1.549 m)   Wt 242 lb 6.4 oz (110 kg)   LMP 01/17/2012   SpO2 96%   BMI 45.80 kg/m   BP/Weight 03/22/2017 09/12/2014 3/81/8299  Systolic BP 371 696 789  Diastolic BP 90 82 91  Wt. (Lbs) 242.4 204 207  BMI 45.8 38.57 39.13     Physical Exam  Constitutional: She is oriented to person, place, and time. She appears well-developed and well-nourished.  Cardiovascular: Normal rate, regular rhythm, normal heart sounds and intact distal pulses.  Pulmonary/Chest: Effort normal and breath sounds normal.  Abdominal: Soft. Bowel sounds are normal.  Musculoskeletal:       Left shoulder: She exhibits decreased range of motion (Hyperextension/lateral extension.) and pain.       Left hip: She exhibits decreased range of motion.       Cervical back: She exhibits pain.  Lumbar back: She exhibits pain.       Left hand: Normal sensation noted. Decreased strength (4/5 motor strength) noted.  Neurological: She is alert and oriented to person, place, and time.  Reflex Scores:      Tricep reflexes are 2+ on the right side and 2+ on the left side.      Bicep reflexes are 2+ on the right side and 2+ on the left side.      Brachioradialis reflexes are 2+ on the right side and 2+ on the left side.      Patellar reflexes are 2+ on the right side and 1+ on the left side. Skin: Skin is warm and dry. Rash (Thickened plaques to right upper arm and right lower extremities.) noted.  Nursing note  and vitals reviewed.    Assessment & Plan:   1. Chronic bilateral low back pain with left-sided sciatica Encouraged to apply for orange card and  discount program. - naproxen (NAPROSYN) 500 MG tablet; Take 1 tablet (500 mg total) by mouth 2 (two) times daily with a meal.  Dispense: 60 tablet; Refill: 0 - traMADol (ULTRAM) 50 MG tablet; Take 1 tablet (50 mg total) by mouth every 12 (twelve) hours as needed for severe pain.  Dispense: 60 tablet; Refill: 0 - Ambulatory referral to Orthopedics - DG Lumbar Spine Complete; Future  2. Psoriasis  - Ambulatory referral to Dermatology - clobetasol cream (TEMOVATE) 0.05 %; Apply 1 application topically 2 (two) times daily. Apply to affected areas.  Dispense: 30 g; Refill: 0  3. Obesity (BMI 30-39.9)  - TSH  4. Chronic neck and back pain  - Ambulatory referral to Orthopedics - DG Cervical Spine Complete; Future - DG Lumbar Spine Complete; Future  5. History of herniated intervertebral disc  - DG Lumbar Spine Complete; Future  6. Weakness of left arm  - Ambulatory referral to Orthopedics - DG Cervical Spine Complete; Future  7. Left leg weakness  - Ambulatory referral to Orthopedics - DG Lumbar Spine Complete; Future     Follow-up: Return in about 2 months (around 05/22/2017), or if symptoms worsen or fail to improve, for Back Pain.   Alfonse Spruce FNP

## 2017-03-22 NOTE — Progress Notes (Signed)
Patient is here for hip & leg pain   Sensitive to touch

## 2017-03-23 LAB — TSH: TSH: 2.45 u[IU]/mL (ref 0.450–4.500)

## 2017-04-04 ENCOUNTER — Telehealth: Payer: Self-pay

## 2017-04-04 NOTE — Telephone Encounter (Signed)
CMA call regarding lab results   Patient did not answer but left a VM stating the reason of the call &  to call me back  

## 2017-04-04 NOTE — Telephone Encounter (Signed)
-----   Message from Alfonse Spruce, [place] sent at 04/01/2017  1:06 PM EST ----- Thyroid function normal. When decreased it can cause weight gain.

## 2017-05-23 ENCOUNTER — Ambulatory Visit: Payer: No Typology Code available for payment source | Attending: Family Medicine | Admitting: Family Medicine

## 2017-05-23 ENCOUNTER — Encounter: Payer: Self-pay | Admitting: Family Medicine

## 2017-05-23 VITALS — BP 131/82 | HR 102 | Temp 98.2°F | Resp 18 | Ht 61.0 in | Wt 240.0 lb

## 2017-05-23 DIAGNOSIS — M5410 Radiculopathy, site unspecified: Secondary | ICD-10-CM

## 2017-05-23 DIAGNOSIS — R200 Anesthesia of skin: Secondary | ICD-10-CM | POA: Insufficient documentation

## 2017-05-23 DIAGNOSIS — M79609 Pain in unspecified limb: Secondary | ICD-10-CM

## 2017-05-23 DIAGNOSIS — B353 Tinea pedis: Secondary | ICD-10-CM | POA: Insufficient documentation

## 2017-05-23 DIAGNOSIS — G8929 Other chronic pain: Secondary | ICD-10-CM

## 2017-05-23 DIAGNOSIS — Z9181 History of falling: Secondary | ICD-10-CM | POA: Insufficient documentation

## 2017-05-23 DIAGNOSIS — L409 Psoriasis, unspecified: Secondary | ICD-10-CM

## 2017-05-23 DIAGNOSIS — R202 Paresthesia of skin: Secondary | ICD-10-CM

## 2017-05-23 DIAGNOSIS — M5442 Lumbago with sciatica, left side: Secondary | ICD-10-CM | POA: Insufficient documentation

## 2017-05-23 DIAGNOSIS — L400 Psoriasis vulgaris: Secondary | ICD-10-CM | POA: Insufficient documentation

## 2017-05-23 MED ORDER — TERBINAFINE HCL 1 % EX CREA: 1.0000 "application " | TOPICAL_CREAM | Freq: Two times a day (BID) | CUTANEOUS | 0 refills | Status: DC

## 2017-05-23 MED ORDER — PREDNISONE 20 MG PO TABS: ORAL_TABLET | ORAL | 0 refills | Status: DC

## 2017-05-23 MED ORDER — CLOBETASOL PROPIONATE 0.05 % EX CREA: 1.0000 "application " | TOPICAL_CREAM | Freq: Two times a day (BID) | CUTANEOUS | 0 refills | Status: DC

## 2017-05-23 MED ORDER — DICLOFENAC SODIUM 75 MG PO TBEC: 75.0000 mg | DELAYED_RELEASE_TABLET | Freq: Two times a day (BID) | ORAL | 0 refills | Status: DC

## 2017-05-23 MED FILL — ?DICLOFENAC SOD DR 75 MG TA: 75 | 30 days supply | Qty: 60 | Fill #0

## 2017-05-23 NOTE — Progress Notes (Signed)
Subjective:  Patient ID: Madison [name], female    DOB: [birth year]  Age: 42 y.o. MRN: [id number]  CC: Back Pain   HPI GINELLE BAYS presents for complaints of back pain.  Back and neck pain: Onset over 2 years ago.  Symptoms include aching, burning pain.  Pain extends from lower back to left lower extremity.  Associated symptoms include paresthesias, and limited range of motion.  She reports pain most days is 8 out of 10.  Pain worst with bending forward, sitting, and recumbency. She reports episodes of falling due to low back pain in the past. Most recent episode was Saturday while shopping in a grocery store. She denies any head injury, bowel or bladder incontinence. She reports taking Aleve 2 tablets over-the-counter 3 times a day for pain.  History of herniated disc in the past.  She reports not following up with specialist due to lack of health insurance. Xray ordered previously she reports not following up.  Skin complaint: Onset 2 years ago.  Located to right upper arm and right lower leg.  Associated symptoms include pruritus, scaling.  She reports not seeing a dermatologist. Previous treatment has included clobetasol cream with minimal relief of symptoms. Feet itching    Outpatient Medications Prior to Visit  Medication Sig Dispense Refill  . traMADol (ULTRAM) 50 MG tablet Take 1 tablet (50 mg total) by mouth every 12 (twelve) hours as needed for severe pain. (Patient not taking: Reported on 05/23/2017) 60 tablet 0  . clobetasol cream (TEMOVATE) 9.38 % Apply 1 application topically 2 (two) times daily. Apply to affected areas. 30 g 0  . cyclobenzaprine (FLEXERIL) 10 MG tablet Take 1 tablet (10 mg total) by mouth at bedtime. (Patient not taking: Reported on 03/22/2017) 30 tablet 2  . diclofenac (VOLTAREN) 75 MG EC tablet Take 1 tablet (75 mg total) by mouth 2 (two) times daily with a meal. (Patient not taking: Reported on 03/22/2017) 60 tablet 2  . gabapentin (NEURONTIN) 300 MG  capsule Take 2 capsules (600 mg total) by mouth 3 (three) times daily. (Patient not taking: Reported on 03/22/2017) 180 capsule 3  . methylPREDNISolone (MEDROL DOSEPAK) 4 MG TBPK tablet Taper per packet instructions (Patient not taking: Reported on 03/22/2017) 21 tablet 0  . naproxen (NAPROSYN) 500 MG tablet Take 1 tablet (500 mg total) by mouth 2 (two) times daily with a meal. (Patient not taking: Reported on 05/23/2017) 60 tablet 0   No facility-administered medications prior to visit.    Review of Systems  Constitutional: Negative.   Eyes: Negative.   Respiratory: Negative.   Cardiovascular: Negative.   Musculoskeletal: Positive for back pain, falls and myalgias.  Skin: Negative.   Neurological: Positive for tingling (paresthesias).  Psychiatric/Behavioral: Negative for suicidal ideas.     Objective:  BP 131/82 (BP Location: Left Arm, Patient Position: Sitting, Cuff Size: Large)   Pulse (!) 102   Temp 98.2 F (36.8 C) (Oral)   Resp 18   Ht 5\' 1"  (1.549 m)   Wt 240 lb (108.9 kg)   LMP 01/17/2012   SpO2 98%   BMI 45.35 kg/m   BP/Weight 05/23/2017 03/22/2017 1/82/9937  Systolic BP 169 678 938  Diastolic BP 82 90 82  Wt. (Lbs) 240 242.4 204  BMI 45.35 45.8 38.57   Physical Exam  Nursing note and vitals reviewed. Constitutional: She is oriented to person, place, and time. She appears well-developed and well-nourished.  Cardiovascular: Normal rate, normal heart sounds and intact distal pulses.  Respiratory: Effort normal and breath sounds normal.  GI: Soft. Bowel sounds are normal.  Neurological: She is alert and oriented to person, place, and time.  Reflex Scores:      Tricep reflexes are 2+ on the right side and 2+ on the left side.      Bicep reflexes are 2+ on the right side and 2+ on the left side.      Brachioradialis reflexes are 2+ on the right side and 2+ on the left side.      Patellar reflexes are 1+ on the right side and 1+ on the left side.      Achilles  reflexes are 1+ on the right side and 1+ on the left side. Skin: Rash (Thickened plaques to right upper arm and right lower extremities; bilateral plantar feet rash and skin peeling) noted.  Psychiatric: Her mood appears anxious. She expresses no homicidal and no suicidal ideation. She expresses no suicidal plans and no homicidal plans. She is communicative. She is attentive.    Assessment & Plan:   1. Chronic bilateral low back pain with left-sided sciatica Encouraged to apply for orange card and discount programs. - DG Lumbar Spine Complete; Future - predniSONE (DELTASONE) 20 MG tablet; DAY 1: TAKE 6 TABLETS BY MOUTH WITH MEAL; DAY 2: TAKE 5 TABLETS; DAY 3: TAKE 4 TABLETS; DAY 4: TAKE 3 TABLETS; DAY 5: TAKE 2 TABLETS; DAY 6: TAKE 1 TABLET.  Dispense: 21 tablet; Refill: 0 - diclofenac (VOLTAREN) 75 MG EC tablet; Take 1 tablet (75 mg total) by mouth 2 (two) times daily.  Dispense: 60 tablet; Refill: 0 - Ambulatory referral to Neurosurgery  2. Plaque psoriasis   - Ambulatory referral to Dermatology  3. Paresthesia and pain of left extremity   - Ambulatory referral to Neurosurgery  4. Radicular low back pain  - DG Lumbar Spine Complete; Future  5. History of fall   6. Psoriasis  - clobetasol cream (TEMOVATE) 0.05 %; Apply 1 application topically 2 (two) times daily. Apply to affected areas.  Dispense: 30 g; Refill: 0  7. Tinea pedis of both feet  - terbinafine (LAMISIL AT) 1 % cream; Apply 1 application topically 2 (two) times daily. For 14 days.  Dispense: 30 g; Refill: 0    Follow-up: Return in about 8 weeks (around 07/18/2017), or if symptoms worsen or fail to improve, for Chronic back pain.   Alfonse Spruce FNP

## 2017-05-23 NOTE — Patient Instructions (Addendum)
Back Pain, Adult Back pain is very common. The pain often gets better over time. The cause of back pain is usually not dangerous. Most people can learn to manage their back pain on their own. Follow these instructions at home: Watch your back pain for any changes. The following actions may help to lessen any pain you are feeling:  Stay active. Start with short walks on flat ground if you can. Try to walk farther each day.  Exercise regularly as told by your doctor. Exercise helps your back heal faster. It also helps avoid future injury by keeping your muscles strong and flexible.  Do not sit, drive, or stand in one place for more than 30 minutes.  Do not stay in bed. Resting more than 1-2 days can slow down your recovery.  Be careful when you bend or lift an object. Use good form when lifting: ? Bend at your knees. ? Keep the object close to your body. ? Do not twist.  Sleep on a firm mattress. Lie on your side, and bend your knees. If you lie on your back, put a pillow under your knees.  Take medicines only as told by your doctor.  Put ice on the injured area. ? Put ice in a plastic bag. ? Place a towel between your skin and the bag. ? Leave the ice on for 20 minutes, 2-3 times a day for the first 2-3 days. After that, you can switch between ice and heat packs.  Avoid feeling anxious or stressed. Find good ways to deal with stress, such as exercise.  Maintain a healthy weight. Extra weight puts stress on your back.  Contact a doctor if:  You have pain that does not go away with rest or medicine.  You have worsening pain that goes down into your legs or buttocks.  You have pain that does not get better in one week.  You have pain at night.  You lose weight.  You have a fever or chills. Get help right away if:  You cannot control when you poop (bowel movement) or pee (urinate).  Your arms or legs feel weak.  Your arms or legs lose feeling (numbness).  You feel sick  to your stomach (nauseous) or throw up (vomit).  You have belly (abdominal) pain.  You feel like you may pass out (faint). This information is not intended to replace advice given to you by your health care provider. Make sure you discuss any questions you have with your health care provider. Document Released: 10/06/2007 Document Revised: 09/25/2015 Document Reviewed: 08/21/2013 Elsevier Interactive Patient Education  2018 [place] Athlete's foot (tinea pedis) is a fungal infection of the skin on the feet. It often occurs on the skin that is between or underneath the toes. It can also occur on the soles of the feet. The infection can spread from person to person (is contagious). Follow these instructions at home:  Apply or take over-the-counter and prescription medicines only as told by your doctor.  Keep all follow-up visits as told by your doctor. This is important.  Do not scratch your feet.  Keep your feet dry: ? Wear cotton or wool socks. Change your socks every day or if they become wet. ? Wear shoes that allow air to move around, such as sandals or canvas tennis shoes.  Wash and dry your feet: ? Every day or as told by your doctor. ? After exercising. ? Including the area between your toes.  Wear sandals in wet areas, such as locker rooms and shared showers.  Do not share any of these items: ? Towels. ? Nail clippers. ? Other personal items that touch your feet.  If you have diabetes, keep your blood sugar under control. Contact a doctor if:  You have a fever.  You have swelling, soreness, warmth, or redness in your foot.  You are not getting better with treatment.  Your symptoms get worse.  You have new symptoms. This information is not intended to replace advice given to you by your health care provider. Make sure you discuss any questions you have with your health care provider. Document Released: 10/06/2007 Document Revised:  09/25/2015 Document Reviewed: 10/21/2014 Elsevier Interactive Patient Education  2018 [employer].

## 2017-05-24 ENCOUNTER — Other Ambulatory Visit: Payer: Self-pay | Admitting: Family Medicine

## 2017-05-24 DIAGNOSIS — L409 Psoriasis, unspecified: Secondary | ICD-10-CM

## 2017-05-24 MED ORDER — CLOBETASOL PROPIONATE 0.05 % EX CREA: 1.0000 "application " | TOPICAL_CREAM | Freq: Two times a day (BID) | CUTANEOUS | 0 refills | Status: DC

## 2017-07-18 ENCOUNTER — Ambulatory Visit: Payer: Self-pay | Admitting: Family Medicine

## 2017-08-08 ENCOUNTER — Ambulatory Visit: Payer: Self-pay | Attending: Nurse Practitioner | Admitting: Nurse Practitioner

## 2017-08-08 ENCOUNTER — Encounter: Payer: Self-pay | Admitting: Nurse Practitioner

## 2017-08-08 VITALS — BP 129/90 | HR 86 | Temp 98.5°F | Ht 61.0 in | Wt 243.2 lb

## 2017-08-08 DIAGNOSIS — F17200 Nicotine dependence, unspecified, uncomplicated: Secondary | ICD-10-CM

## 2017-08-08 DIAGNOSIS — Z90710 Acquired absence of both cervix and uterus: Secondary | ICD-10-CM | POA: Insufficient documentation

## 2017-08-08 DIAGNOSIS — F17210 Nicotine dependence, cigarettes, uncomplicated: Secondary | ICD-10-CM | POA: Insufficient documentation

## 2017-08-08 DIAGNOSIS — Z6841 Body Mass Index (BMI) 40.0 and over, adult: Secondary | ICD-10-CM | POA: Insufficient documentation

## 2017-08-08 DIAGNOSIS — G8929 Other chronic pain: Secondary | ICD-10-CM | POA: Insufficient documentation

## 2017-08-08 DIAGNOSIS — M5442 Lumbago with sciatica, left side: Secondary | ICD-10-CM | POA: Insufficient documentation

## 2017-08-08 DIAGNOSIS — Z8249 Family history of ischemic heart disease and other diseases of the circulatory system: Secondary | ICD-10-CM | POA: Insufficient documentation

## 2017-08-08 DIAGNOSIS — L209 Atopic dermatitis, unspecified: Secondary | ICD-10-CM | POA: Insufficient documentation

## 2017-08-08 DIAGNOSIS — M542 Cervicalgia: Secondary | ICD-10-CM | POA: Insufficient documentation

## 2017-08-08 MED ORDER — PHENTERMINE HCL 37.5 MG PO CAPS: 37.5000 mg | ORAL_CAPSULE | ORAL | 0 refills | Status: DC

## 2017-08-08 MED ORDER — TRIAMCINOLONE ACETONIDE 0.5 % EX OINT: 1.0000 "application " | TOPICAL_OINTMENT | Freq: Two times a day (BID) | CUTANEOUS | 1 refills | Status: DC

## 2017-08-08 MED ORDER — GABAPENTIN 300 MG PO CAPS: 300.0000 mg | ORAL_CAPSULE | Freq: Three times a day (TID) | ORAL | 3 refills | Status: DC

## 2017-08-08 MED FILL — TRIAMCINOLONE 0.5% OINTMENT: 0.5 | 15 days supply | Qty: 30 | Fill #0

## 2017-08-08 NOTE — Progress Notes (Signed)
Assessment & Plan:  Madison [name] was seen today for establish care and back pain.  Diagnoses and all orders for this visit:  Chronic bilateral low back pain with left-sided sciatica -     gabapentin (NEURONTIN) 300 MG capsule; Take 1 capsule (300 mg total) by mouth 3 (three) times daily. Work on losing weight to help reduce back pain. May alternate with heat and ice application for pain relief. May also alternate with acetaminophen and Ibuprofen as prescribed for back pain. Other alternatives include massage, acupuncture and water aerobics.  You must stay active and avoid a sedentary lifestyle.  Morbid obesity with BMI of 45.0-49.9, adult (HCC) -     phentermine 37.5 MG capsule; Take 1 capsule (37.5 mg total) by mouth every morning. Discussed diet and exercise for person with BMI >25. Instructed: You must burn more calories than you eat. Losing 5 percent of your body weight should be considered a success. In the longer term, losing more than 15 percent of your body weight and staying at this weight is an extremely good result. However, keep in mind that even losing 5 percent of your body weight leads to important health benefits, so try not to get discouraged if you're not able to lose more than this. Will recheck weight in 4 weeks. She was advised to lose at least 4 lbs by her next office visit. Also she is aware if blood pressure is elevated will need to stop phentermine.    Current smoker Sadee was counseled on the dangers of tobacco use, and was advised to quit. Reviewed strategies to maximize success, including removing cigarettes and smoking materials from environment, stress management and support of family/friends as well as pharmacological alternatives including: Wellbutrin, Chantix, Nicotine patch, Nicotine gum or lozenges. Smoking cessation support: smoking cessation hotline: 1-800-QUIT-NOW.  Smoking cessation classes are also available through [facility]. Call  [phone] or visit our website at [email or web address]   A total of 3 minutes was spent on counseling for smoking cessation and Avenly is not ready to quit.   Atopic dermatitis, unspecified type -     triamcinolone ointment (KENALOG) 0.5 %; Apply 1 application topically 2 (two) times daily. Apply to skin and then apply Eucerin cream to help seal in moisture to the skin.   Patient has been counseled on age-appropriate routine health concerns for screening and prevention. These are reviewed and up-to-date. Referrals have been placed accordingly. Immunizations are up-to-date or declined.    Subjective:   Chief Complaint  Patient presents with  . Establish Care  . Back Pain    Pt. is here for her back pain and stated it's causing pain on her leg.    HPI Madison [name] 42 y.o. female presents to office today tp establish care. She has chronic neck and back pain. Per review of notes she has a history of herniated disc. No specialist f/u due to lack of insurance. She has not spoken with the financial counselor for financial assistance. She is more interested in weight loss management today.    Chronic Back Pain with Sciatica Chronic. She endorses little relief of pain with taking aleve, tramadol, flexeril (helped her sleep well but did not relieve pain), voltaren or naproxen. Prednisone has helped in the past. She feels her weight is also contributing to her pain. She reports making significant changes with her diet:drinking more water; increased protein, lean meat intake. Stopped eating fried foods and drinking sodas. Despite these changes she has  still been unable to lose weight. Last TSH (2018) was normal. 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  MRI Lumbar Spine 09-24-2014 1. 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 2. No other significant acquired findings. 3.  Congenitally short pedicles.  Obesity She is interested in weight loss medication. She is aware that weight loss medication is not to be taken long term. She was also instructed that although weight loss medication will suppress her appetite, she will need to continue to make healthy food choices and also find creative ways to work out that do not cause increased back pain. If she is not successful with losing weight each month will need to stop phentermine. Goal is at least 4-6 lbs each month.   Review of Systems  Constitutional: Negative for fever, malaise/fatigue and weight loss.  HENT: Negative.  Negative for nosebleeds.   Eyes: Negative.  Negative for blurred vision, double vision and photophobia.  Respiratory: Negative.  Negative for cough and shortness of breath.   Cardiovascular: Negative.  Negative for chest pain, palpitations and leg swelling.  Gastrointestinal: Negative.  Negative for heartburn, nausea and vomiting.  Musculoskeletal: Positive for back pain, falls and neck pain. Negative for myalgias.       SEE HPI  Skin: Positive for itching and rash.  Neurological: Negative.  Negative for dizziness, focal weakness, seizures and headaches.  Psychiatric/Behavioral: Negative.  Negative for suicidal ideas.    Past Medical History:  Diagnosis Date  . Uterine fibroid     Past Surgical History:  Procedure Laterality Date  . ABDOMINAL HYSTERECTOMY  2013  . NO PAST SURGERIES      Family History  Problem Relation Age of Onset  . Hypertension Mother   . Hypertension Sister     Social History Reviewed with no changes to be made today.   Outpatient Medications Prior to Visit  Medication Sig Dispense Refill  . clobetasol cream (TEMOVATE) 3.01 % Apply 1 application topically 2 (two) times daily. Apply to affected areas. (Patient not taking: Reported on 08/08/2017) 60 g 0  . diclofenac (VOLTAREN) 75 MG EC tablet Take 1 tablet (75 mg total) by mouth 2 (two) times daily. (Patient not  taking: Reported on 08/08/2017) 60 tablet 0  . predniSONE (DELTASONE) 20 MG tablet DAY 1: TAKE 6 TABLETS BY MOUTH WITH MEAL; DAY 2: TAKE 5 TABLETS; DAY 3: TAKE 4 TABLETS; DAY 4: TAKE 3 TABLETS; DAY 5: TAKE 2 TABLETS; DAY 6: TAKE 1 TABLET. (Patient not taking: Reported on 08/08/2017) 21 tablet 0  . terbinafine (LAMISIL AT) 1 % cream Apply 1 application topically 2 (two) times daily. For 14 days. (Patient not taking: Reported on 08/08/2017) 30 g 0  . traMADol (ULTRAM) 50 MG tablet Take 1 tablet (50 mg total) by mouth every 12 (twelve) hours as needed for severe pain. (Patient not taking: Reported on 05/23/2017) 60 tablet 0   No facility-administered medications prior to visit.     No Known Allergies     Objective:    BP 129/90 (BP Location: Left Arm, Patient Position: Sitting, Cuff Size: Large)   Pulse 86   Temp 98.5 F (36.9 C) (Oral)   Ht 5\' 1"  (1.549 m)   Wt 243 lb 3.2 oz (110.3 kg)   LMP 01/17/2012   SpO2 97%   BMI 45.95 kg/m  Wt Readings from Last 3 Encounters:  08/08/17 243 lb 3.2 oz (110.3 kg)  05/23/17 240 lb (108.9 kg)  03/22/17 242 lb 6.4 oz (110 kg)    Physical Exam  Constitutional: She is oriented to person, place, and time. She appears well-developed and well-nourished. She is cooperative.  HENT:  Head: Normocephalic and atraumatic.  Eyes: EOM are normal.  Neck: Normal range of motion.  Cardiovascular: Normal rate, regular rhythm and normal heart sounds. Exam reveals no gallop and no friction rub.  No murmur heard. Pulmonary/Chest: Effort normal and breath sounds normal. No tachypnea. No respiratory distress. She has no decreased breath sounds. She has no wheezes. She has no rhonchi. She has no rales. She exhibits no tenderness.  Abdominal: Soft. Bowel sounds are normal.  Musculoskeletal: Normal range of motion. She exhibits no edema.  Neurological: She is alert and oriented to person, place, and time. Coordination normal.  Skin: Skin is warm and dry. Rash noted. Rash is  macular.  Eczematous appearing rash on RLE near ankle as well on on right forearm.   Psychiatric: She has a normal mood and affect. Her behavior is normal. Judgment and thought content normal.  Nursing note and vitals reviewed.        Patient has been counseled extensively about nutrition and exercise as well as the importance of adherence with medications and regular follow-up. The patient was given clear instructions to go to ER or return to medical center if symptoms don't improve, worsen or new problems develop. The patient verbalized understanding.   Follow-up: Return in about 1 month (around 09/05/2017) for weight loss and back pain. She was advised to lose at least 4 lbs by her next office visit. Also she is aware if blood pressure is elevated above today's reading: will need to stop phentermine.     Gildardo Pounds, FNP-BC [facility], [place]   08/08/2017, 8:56 PM

## 2017-08-08 NOTE — Patient Instructions (Addendum)
My Fitness Pal is a good app for weight loss. Do not consume more than 1800 calories a day. Do not consume more than 1800mg  of sodium per day.      Chronic Back Pain When back pain lasts longer than 3 months, it is called chronic back pain.The cause of your back pain may not be known. Some common causes include:  Wear and tear (degenerative disease) of the bones, ligaments, or disks in your back.  Inflammation and stiffness in your back (arthritis).  People who have chronic back pain often go through certain periods in which the pain is more intense (flare-ups). Many people can learn to manage the pain with home care. Follow these instructions at home: Pay attention to any changes in your symptoms. Take these actions to help with your pain: Activity  Avoid bending and activities that make the problem worse.  Do not sit or stand in one place for long periods of time.  Take brief periods of rest throughout the day. This will reduce your pain. Resting in a lying or standing position is usually better than sitting to rest.  When you are resting for longer periods, mix in some mild activity or stretching between periods of rest. This will help to prevent stiffness and pain.  Get regular exercise. Ask your health care provider what activities are safe for you.  Do not lift anything that is heavier than 10 lb (4.5 kg). Always use proper lifting technique, which includes: ? Bending your knees. ? Keeping the load close to your body. ? Avoiding twisting. Managing pain  If directed, apply ice to the painful area. Your health care provider may recommend applying ice during the first 24-48 hours after a flare-up begins. ? Put ice in a plastic bag. ? Place a towel between your skin and the bag. ? Leave the ice on for 20 minutes, 2-3 times per day.  After icing, apply heat to the affected area as often as told by your health care provider. Use the heat source that your health care provider  recommends, such as a moist heat pack or a heating pad. ? Place a towel between your skin and the heat source. ? Leave the heat on for 20-30 minutes. ? Remove the heat if your skin turns bright red. This is especially important if you are unable to feel pain, heat, or cold. You may have a greater risk of getting burned.  Try soaking in a warm tub.  Take over-the-counter and prescription medicines only as told by your health care provider.  Keep all follow-up visits as told by your health care provider. This is important. Contact a health care provider if:  You have pain that is not relieved with rest or medicine. Get help right away if:  You have weakness or numbness in one or both of your legs or feet.  You have trouble controlling your bladder or your bowels.  You have nausea or vomiting.  You have pain in your abdomen.  You have shortness of breath or you faint. This information is not intended to replace advice given to you by your health care provider. Make sure you discuss any questions you have with your health care provider. Document Released: 05/27/2004 Document Revised: 08/28/2015 Document Reviewed: 10/07/2014 Elsevier Interactive Patient Education  2018 [employer].  What You Need to Know About Chronic Back Pain Long-term (chronic) back pain is back pain that lasts for 12 weeks or longer. It often affects the lower back and  can range from mild to severe. Many people have back pain at some point in their lives. It can feel different to each person. It may feel like a muscle ache or a sharp, stabbing pain. The pain often gets worse over time. It can be difficult to find the cause of chronic back pain. Treating chronic back pain often starts with rest and pain relief, followed by exercises (physical therapy) to strengthen the muscles that support your back. You may have to try different things to see what works best for you. If other treatments do not help, or if your pain is  caused by a condition or an injury, you may need surgery. How can back pain affect me? Chronic back pain is uncomfortable and can make it hard to do your usual daily activities. Chronic back pain can:  Cause numbness and tingling.  Come and go.  Get worse when you are sitting, standing, walking, bending, or lifting.  Affect you while you are active, at rest, or both.  Eventually make it hard to move around.  Occur with fever, weight loss, or difficulty urinating.  What are the benefits of treating back pain? Treating chronic back pain may:  Relieve pain.  Keep your pain from getting worse.  Make it easier for you to do your usual activities.  What are some steps I can take to decrease my back pain?  Take over-the-counter or prescription medicines only as told by your health care provider.  If directed, apply heat to the affected area. Use the heat source that your health care provider recommends, such as a moist heat pack or a heating pad. ? Place a towel between your skin and the heat source. ? Leave the heat on for 20-30 minutes. ? Remove the heat if your skin turns bright red. This is especially important if you are unable to feel pain, heat, or cold. You may have a greater risk of getting burned.  If directed, put ice on the affected area: ? Put ice in a plastic bag. ? Place a towel between your skin and the bag. ? Leave the ice on for 20 minutes, 2-3 times a day.  Get regular exercise as told by your health care provider to improve flexibility and strength.  Do not smoke.  Maintain a healthy weight.  When lifting objects: ? Keep your feet as far apart as your shoulders (shoulder-width apart) or farther apart. ? Tighten the muscles in your abdomen. ? Bend your knees and hips and keep your spine neutral. It is important to lift using the strength of your legs, not your back. Do not lock your knees straight out. ? Always ask for help to lift heavy or awkward  objects. What can happen if my back pain goes untreated? Untreated back pain can:  Get worse over time.  Start to occur more often or at different times, such as when you are resting.  Cause posture problems.  Make it hard to move around (limit mobility).  Where can I get support? Chronic back pain can be a frustrating condition to manage. It may help to talk with other people who are having a similar experience. Consider joining a support group for people dealing with chronic back pain. Ask your health care provider about support groups in your area. You can also find online and in-person support groups through:  The American Chronic Pain Association: DeluxeOption.si  The U.S. Pain Foundation: uspainfoundation.org/support-groups  Contact a health care provider if:  Your symptoms  do not get better or they get worse.  You have severe back pain.  You have chronic back pain and a fever.  You lose weight without trying.  You have difficulty urinating.  You experience numbness or tingling.  You develop new pain after an injury. Summary  Chronic back pain is often treated with rest, pain relief, and physical therapy.  Get regular exercise to improve your strength and flexibility.  Put heat and ice on the affected areas as directed by your health care provider.  Chronic back pain can be challenging to live with. Joining a support group may help you manage your condition. This information is not intended to replace advice given to you by your health care provider. Make sure you discuss any questions you have with your health care provider. Document Released: 05/04/2015 Document Revised: 12/27/2015 Document Reviewed: 12/27/2015 Elsevier Interactive Patient Education  2018 [place]  Preventing Unhealthy [employer], Adult Staying at a healthy weight is important. When fat builds up in your body, you may become overweight or obese. These conditions put  you at greater risk for developing certain health problems, such as heart disease, diabetes, sleeping problems, joint problems, and some cancers. Unhealthy weight gain is often the result of making unhealthy choices in what you eat. It is also a result of not getting enough exercise. You can make changes to your lifestyle to prevent obesity and stay as healthy as possible. What nutrition changes can be made? To maintain a healthy weight and prevent obesity:  Eat only as much as your body needs. To do this: ? Pay attention to signs that you are hungry or full. Stop eating as soon as you feel full. ? If you feel hungry, try drinking water first. Drink enough water so your urine is clear or pale yellow. ? Eat smaller portions. ? Look at serving sizes on food labels. Most foods contain more than one serving per container. ? Eat the recommended amount of calories for your gender and activity level. While most active people should eat around 2,000 calories per day, if you are trying to lose weight or are not very active, you main need to eat less calories. Talk to your health care provider or dietitian about how many calories you should eat each day.  Choose healthy foods, such as: ? Fruits and vegetables. Try to fill at least half of your plate at each meal with fruits and vegetables. ? Whole grains, such as whole wheat bread, brown rice, and quinoa. ? Lean meats, such as chicken or fish. ? Other healthy proteins, such as beans, eggs, or tofu. ? Healthy fats, such as nuts, seeds, fatty fish, and olive oil. ? Low-fat or fat-free dairy.  Check food labels and avoid food and drinks that: ? Are high in calories. ? Have added sugar. ? Are high in sodium. ? Have saturated fats or trans fats.  Limit how much you eat of the following foods: ? Prepackaged meals. ? Fast food. ? Fried foods. ? Processed meat, such as bacon, sausage, and deli meats. ? Fatty cuts of red meat and poultry with  skin.  Cook foods in healthier ways, such as by baking, broiling, or grilling.  When grocery shopping, try to shop around the outside of the store. This helps you buy mostly fresh foods and avoid canned and prepackaged foods.  What lifestyle changes can be made?  Exercise at least 30 minutes 5 or more days each week. Exercising includes brisk walking, yard  work, biking, running, swimming, and team sports like basketball and soccer. Ask your health care provider which exercises are safe for you.  Do not use any products that contain nicotine or tobacco, such as cigarettes and e-cigarettes. If you need help quitting, ask your health care provider.  Limit alcohol intake to no more than 1 drink a day for nonpregnant women and 2 drinks a day for men. One drink equals 12 oz of beer, 5 oz of wine, or 1 oz of hard liquor.  Try to get 7-9 hours of sleep each night. What other changes can be made?  Keep a food and activity journal to keep track of: ? What you ate and how many calories you had. Remember to count sauces, dressings, and side dishes. ? Whether you were active, and what exercises you did. ? Your calorie, weight, and activity goals.  Check your weight regularly. Track any changes. If you notice you have gained weight, make changes to your diet or activity routine.  Avoid taking weight-loss medicines or supplements. Talk to your health care provider before starting any new medicine or supplement.  Talk to your health care provider before trying any new diet or exercise plan. Why are these changes important? Eating healthy, staying active, and having healthy habits not only help prevent obesity, they also:  Help you to manage stress and emotions.  Help you to connect with friends and family.  Improve your self-esteem.  Improve your sleep.  Prevent long-term health problems.  What can happen if changes are not made? Being obese or overweight can cause you to develop joint or  bone problems, which can make it hard for you to stay active or do activities you enjoy. Being obese or overweight also puts stress on your heart and lungs and can lead to health problems like diabetes, heart disease, and some cancers. Where to find more information: Talk with your health care provider or a dietitian about healthy eating and healthy lifestyle choices. You may also find other information through these resources:  U.S. Department of Agriculture MyPlate: FormerBoss.no  American Heart Association: www.heart.org  Centers for Disease Control and Prevention: http://www.wolf.info/  Summary  Staying at a healthy weight is important. It helps prevent certain diseases and health problems, such as heart disease, diabetes, joint problems, sleep disorders, and some cancers.  Being obese or overweight can cause you to develop joint or bone problems, which can make it hard for you to stay active or do activities you enjoy.  You can prevent unhealthy weight gain by eating a healthy diet, exercising regularly, not smoking, limiting alcohol, and getting enough sleep.  Talk with your health care provider or a dietitian for guidance about healthy eating and healthy lifestyle choices. This information is not intended to replace advice given to you by your health care provider. Make sure you discuss any questions you have with your health care provider. Document Released: 04/20/2016 Document Revised: 05/26/2016 Document Reviewed: 05/26/2016 Elsevier Interactive Patient Education  [employer].

## 2017-08-09 ENCOUNTER — Other Ambulatory Visit: Payer: Self-pay

## 2017-08-09 DIAGNOSIS — Z6841 Body Mass Index (BMI) 40.0 and over, adult: Principal | ICD-10-CM

## 2017-08-09 MED ORDER — PHENTERMINE HCL 37.5 MG PO CAPS: 37.5000 mg | ORAL_CAPSULE | ORAL | 0 refills | Status: DC

## 2017-08-09 NOTE — Telephone Encounter (Signed)
Pt. Request to send Rx to [facility] on [address].

## 2017-10-04 ENCOUNTER — Ambulatory Visit: Payer: Self-pay | Admitting: Nurse Practitioner

## 2017-11-01 ENCOUNTER — Ambulatory Visit: Payer: Self-pay | Attending: Nurse Practitioner | Admitting: Nurse Practitioner

## 2017-11-01 ENCOUNTER — Other Ambulatory Visit: Payer: Self-pay | Admitting: Nurse Practitioner

## 2017-11-01 ENCOUNTER — Encounter: Payer: Self-pay | Admitting: Nurse Practitioner

## 2017-11-01 VITALS — BP 124/89 | HR 95 | Temp 99.2°F | Ht 62.0 in | Wt 240.6 lb

## 2017-11-01 DIAGNOSIS — Z6841 Body Mass Index (BMI) 40.0 and over, adult: Secondary | ICD-10-CM | POA: Insufficient documentation

## 2017-11-01 DIAGNOSIS — G8929 Other chronic pain: Secondary | ICD-10-CM | POA: Insufficient documentation

## 2017-11-01 DIAGNOSIS — R21 Rash and other nonspecific skin eruption: Secondary | ICD-10-CM | POA: Insufficient documentation

## 2017-11-01 DIAGNOSIS — Z8249 Family history of ischemic heart disease and other diseases of the circulatory system: Secondary | ICD-10-CM | POA: Insufficient documentation

## 2017-11-01 DIAGNOSIS — Z90710 Acquired absence of both cervix and uterus: Secondary | ICD-10-CM | POA: Insufficient documentation

## 2017-11-01 DIAGNOSIS — Z79899 Other long term (current) drug therapy: Secondary | ICD-10-CM | POA: Insufficient documentation

## 2017-11-01 DIAGNOSIS — M5442 Lumbago with sciatica, left side: Secondary | ICD-10-CM | POA: Insufficient documentation

## 2017-11-01 MED ORDER — HYDROXYZINE PAMOATE 25 MG PO CAPS: 25.0000 mg | ORAL_CAPSULE | Freq: Three times a day (TID) | ORAL | 0 refills | Status: DC | PRN

## 2017-11-01 MED ORDER — HYDROXYZINE HCL 25 MG PO TABS: 25.0000 mg | ORAL_TABLET | Freq: Three times a day (TID) | ORAL | 0 refills | Status: DC | PRN

## 2017-11-01 MED ORDER — PHENTERMINE HCL 37.5 MG PO CAPS
37.5000 mg | ORAL_CAPSULE | ORAL | 1 refills | Status: DC
Start: 2017-11-01 — End: 2018-11-08

## 2017-11-01 MED ORDER — CYCLOBENZAPRINE HCL 5 MG PO TABS: 5.0000 mg | ORAL_TABLET | Freq: Three times a day (TID) | ORAL | 1 refills | Status: AC | PRN

## 2017-11-01 NOTE — Progress Notes (Signed)
Assessment & Plan:  Madison [name] was seen today for follow-up.  Diagnoses and all orders for this visit:  Morbid obesity with BMI of 40.0-44.9, adult (HCC) -     CBC -     CMP14+EGFR -     Lipid panel -     phentermine 37.5 MG capsule; Take 1 capsule (37.5 mg total) by mouth every morning. GOAL WEIGHT LOSS 5-10LBS by next office visit Discussed diet and exercise for person with BMI >25. Instructed: You must burn more calories than you eat. Losing 5 percent of your body weight should be considered a success. In the longer term, losing more than 15 percent of your body weight and staying at this weight is an extremely good result. However, keep in mind that even losing 5 percent of your body weight leads to important health benefits, so try not to get discouraged if you're not able to lose more than this. Will recheck weight next office visit   Chronic bilateral low back pain with left-sided sciatica -     cyclobenzaprine (FLEXERIL) 5 MG tablet; Take 1 tablet (5 mg total) by mouth 3 (three) times daily as needed for muscle spasms. STOP SMOKING!!! Smoking Patient will need to work on losing weight to help reduce her back pain. Alternating with heat and ice application for pain relief. May also alternate with XS acetaminophen and Ibuprofen as prescribed for back pain. Other alternatives include massage, acupuncture and water aerobics.  You must stay active and avoid a sedentary lifestyle.   Skin rash -     Ambulatory referral to Dermatology    Patient has been counseled on age-appropriate routine health concerns for screening and prevention. These are reviewed and up-to-date. Referrals have been placed accordingly. Immunizations are up-to-date or declined.    Subjective:   Chief Complaint  Patient presents with  . Follow-up    Pt. is here for follow-up on weight loss and back pain. Pt. stated back pain still there, patient also stated her rash is on her hand now.    HPI Madison [name]  42 y.o. female presents to office today for follow up to morbid obesity and back pain.   Obesity She reports not picking up the phentermine that was prescribed for her as it was not available at her pharmacy. Weight is currently stable. BMI 44. She is requesting the prescription for phentermine be sent to another pharmacy. She has been unable to exercise due to her back pain.   Back Pain Chronic. Symptoms improved with prn flexeril. I believe her weight is contributing greatly to her back pain. She has tried tramadol, voltaren, gabapentin, naproxen and prednisone in the past. Pain described as burning and aching with sciatic to the LLE. Aggravating factors: prolonged sitting and standing, bending, turning, twisting. MRI Lumbar Spine 09-24-2014 1. 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 2. No other significant acquired findings. 3. Congenitally short pedicles. She has not followed up with spine specialist for her back due to lack of insurance. She still has not applied for the orange card.   Rash Patient presents with a rash.  Symptoms have been present for several years.  The rash is located on the right arm and leg. Since then it has not spread to the other body parts. Parent has tried multiple steroid creams including mometasone, triamcinolone and clobetasol  for initial treatment showing rash not changed. Discomfort is moderate, fairly severe. Patient has  not had a fever. Recent illnesses: none. Sick contacts: none known  Review of Systems  Constitutional: Negative for fever, malaise/fatigue and weight loss.  HENT: Negative.  Negative for nosebleeds.   Eyes: Negative.  Negative for blurred vision, double vision and photophobia.  Respiratory: Negative.  Negative for cough and shortness of breath.   Cardiovascular: Negative.  Negative for chest pain, palpitations and leg swelling.  Gastrointestinal: Negative.  Negative  for heartburn, nausea and vomiting.  Musculoskeletal: Positive for back pain and myalgias.  Skin: Positive for itching and rash.  Neurological: Negative.  Negative for dizziness, focal weakness, seizures and headaches.  Psychiatric/Behavioral: Negative.  Negative for suicidal ideas.    Past Medical History:  Diagnosis Date  . Uterine fibroid     Past Surgical History:  Procedure Laterality Date  . ABDOMINAL HYSTERECTOMY  2013  . NO PAST SURGERIES      Family History  Problem Relation Age of Onset  . Hypertension Mother   . Hypertension Sister     Social History Reviewed with no changes to be made today.   Outpatient Medications Prior to Visit  Medication Sig Dispense Refill  . triamcinolone ointment (KENALOG) 0.5 % Apply 1 application topically 2 (two) times daily. 30 g 1  . gabapentin (NEURONTIN) 300 MG capsule Take 1 capsule (300 mg total) by mouth 3 (three) times daily. (Patient not taking: Reported on 11/01/2017) 90 capsule 3  . phentermine 37.5 MG capsule Take 1 capsule (37.5 mg total) by mouth every morning. (Patient not taking: Reported on 11/01/2017) 30 capsule 0   No facility-administered medications prior to visit.     No Known Allergies     Objective:    BP 124/89 (BP Location: Right Arm, Patient Position: Sitting, Cuff Size: Large)   Pulse 95   Temp 99.2 F (37.3 C) (Oral)   Ht '5\' 2"'  (1.575 m)   Wt 240 lb 9.6 oz (109.1 kg)   LMP 01/17/2012   SpO2 95%   BMI 44.01 kg/m  Wt Readings from Last 3 Encounters:  11/01/17 240 lb 9.6 oz (109.1 kg)  08/08/17 243 lb 3.2 oz (110.3 kg)  05/23/17 240 lb (108.9 kg)    Physical Exam  Constitutional: She is oriented to person, place, and time. She appears well-developed and well-nourished. She is cooperative.  HENT:  Head: Normocephalic and atraumatic.  Eyes: EOM are normal.  Neck: Normal range of motion.  Cardiovascular: Normal rate, regular rhythm, normal heart sounds and intact distal pulses. Exam reveals no  gallop and no friction rub.  No murmur heard. Pulmonary/Chest: Effort normal and breath sounds normal. No tachypnea. No respiratory distress. She has no decreased breath sounds. She has no wheezes. She has no rhonchi. She has no rales. She exhibits no tenderness.  Abdominal: Soft. Bowel sounds are normal.  Musculoskeletal: Normal range of motion. She exhibits no edema.       Lumbar back: She exhibits pain.  Neurological: She is alert and oriented to person, place, and time. Coordination normal.  Skin: Skin is warm and dry. Rash noted. Rash is maculopapular (hyperpigmented ).     Psychiatric: She has a normal mood and affect. Her behavior is normal. Judgment and thought content normal.  Nursing note and vitals reviewed.      Patient has been counseled extensively about nutrition and exercise as well as the importance of adherence with medications and regular follow-up. The patient was given clear instructions to go to ER or return to medical center if symptoms  don't improve, worsen or new problems develop. The patient verbalized understanding.   Follow-up: Return in about 2 months (around 01/02/2018) for weight loss.   Gildardo Pounds, FNP-BC [facility] [place] [place]   11/01/2017, 4:57 PM

## 2017-11-02 LAB — LIPID PANEL
Chol/HDL Ratio: 4.9 ratio — ABNORMAL HIGH (ref 0.0–4.4)
Cholesterol, Total: 187 mg/dL (ref 100–199)
HDL: 38 mg/dL — ABNORMAL LOW (ref >39)
LDL Calculated: 124 mg/dL — ABNORMAL HIGH (ref 0–99)
Triglycerides: 123 mg/dL (ref 0–149)
VLDL Cholesterol Cal: 25 mg/dL (ref 5–40)

## 2017-11-02 LAB — CBC
Hematocrit: 38.3 % (ref 34.0–46.6)
Hemoglobin: 12.5 g/dL (ref 11.1–15.9)
MCH: 26.3 pg — ABNORMAL LOW (ref 26.6–33.0)
MCHC: 32.6 g/dL (ref 31.5–35.7)
MCV: 81 fL (ref 79–97)
Platelets: 425 10*3/uL (ref 150–450)
RBC: 4.75 x10E6/uL (ref 3.77–5.28)
RDW: 14.9 % (ref 12.3–15.4)
WBC: 12.1 10*3/uL — ABNORMAL HIGH (ref 3.4–10.8)

## 2017-11-02 LAB — CMP14+EGFR
ALT: 21 IU/L (ref 0–32)
AST: 19 IU/L (ref 0–40)
Albumin/Globulin Ratio: 1 — ABNORMAL LOW (ref 1.2–2.2)
Albumin: 3.6 g/dL (ref 3.5–5.5)
Alkaline Phosphatase: 84 IU/L (ref 39–117)
BUN/Creatinine Ratio: 13 (ref 9–23)
BUN: 11 mg/dL (ref 6–24)
Bilirubin Total: <0.2 mg/dL (ref 0.0–1.2)
CO2: 23 mmol/L (ref 20–29)
Calcium: 8.6 mg/dL — ABNORMAL LOW (ref 8.7–10.2)
Chloride: 102 mmol/L (ref 96–106)
Creatinine, Ser: 0.83 mg/dL (ref 0.57–1.00)
GFR calc Af Amer: 101 mL/min/{1.73_m2} (ref >59)
GFR calc non Af Amer: 87 mL/min/{1.73_m2} (ref >59)
Globulin, Total: 3.6 g/dL (ref 1.5–4.5)
Glucose: 117 mg/dL — ABNORMAL HIGH (ref 65–99)
Potassium: 4 mmol/L (ref 3.5–5.2)
Sodium: 138 mmol/L (ref 134–144)
Total Protein: 7.2 g/dL (ref 6.0–8.5)

## 2017-11-09 ENCOUNTER — Telehealth: Payer: Self-pay

## 2017-11-09 NOTE — Telephone Encounter (Signed)
CMA spoke to patient to inform on lab results.  Pt. Verified DOB.  Pt. Understood.   Pt. Was inform to call Monday to schedule financial aid assistance appt. To be able to be refer out to Dermatology.  Pt. Understood.

## 2017-11-09 NOTE — Telephone Encounter (Signed)
-----   Message from Gildardo Pounds, NP sent at 11/07/2017  9:09 AM EDT ----- Labs are essentially normal. There are some minor variations in your blood work that do not require any additional work up at this time. Will continue to monitor. Make sure you are drinking at least 48 oz of water per day. Work on eating a low fat, heart healthy diet and participate in regular aerobic exercise program to control as well. Exercise at least  30 minutes per day-5 days per week.  Your LDL cholesterol level is elevated. Avoid red meat. No fried foods. No junk foods, sodas, sugary foods or drinks, unhealthy snacking, alcohol or smoking.

## 2018-01-10 ENCOUNTER — Ambulatory Visit: Payer: Medicaid Other | Admitting: Nurse Practitioner

## 2018-02-01 ENCOUNTER — Ambulatory Visit: Payer: Self-pay | Attending: Nurse Practitioner | Admitting: Nurse Practitioner

## 2018-02-01 ENCOUNTER — Encounter: Payer: Self-pay | Admitting: Nurse Practitioner

## 2018-02-01 VITALS — BP 138/90 | HR 99 | Temp 98.9°F | Ht 62.0 in | Wt 241.2 lb

## 2018-02-01 DIAGNOSIS — G8929 Other chronic pain: Secondary | ICD-10-CM

## 2018-02-01 DIAGNOSIS — B9689 Other specified bacterial agents as the cause of diseases classified elsewhere: Secondary | ICD-10-CM

## 2018-02-01 DIAGNOSIS — Z79899 Other long term (current) drug therapy: Secondary | ICD-10-CM | POA: Insufficient documentation

## 2018-02-01 DIAGNOSIS — L089 Local infection of the skin and subcutaneous tissue, unspecified: Secondary | ICD-10-CM | POA: Insufficient documentation

## 2018-02-01 DIAGNOSIS — Z9889 Other specified postprocedural states: Secondary | ICD-10-CM | POA: Insufficient documentation

## 2018-02-01 DIAGNOSIS — Z6841 Body Mass Index (BMI) 40.0 and over, adult: Secondary | ICD-10-CM | POA: Insufficient documentation

## 2018-02-01 DIAGNOSIS — M5442 Lumbago with sciatica, left side: Secondary | ICD-10-CM | POA: Insufficient documentation

## 2018-02-01 DIAGNOSIS — Z8249 Family history of ischemic heart disease and other diseases of the circulatory system: Secondary | ICD-10-CM | POA: Insufficient documentation

## 2018-02-01 MED ORDER — CYCLOBENZAPRINE HCL 5 MG PO TABS
5.0000 mg | ORAL_TABLET | Freq: Three times a day (TID) | ORAL | 1 refills | Status: DC | PRN
Start: 2018-02-01 — End: 2018-11-08

## 2018-02-01 MED ORDER — MUPIROCIN 2 % EX OINT: TOPICAL_OINTMENT | CUTANEOUS | 1 refills | Status: DC

## 2018-02-01 MED ORDER — SULFAMETHOXAZOLE-TRIMETHOPRIM 800-160 MG PO TABS: 1.0000 | ORAL_TABLET | Freq: Two times a day (BID) | ORAL | 0 refills | Status: AC

## 2018-02-01 MED FILL — CYCLOBENZAPRINE 5 MG TABLET: 5 | 20 days supply | Qty: 60 | Fill #0

## 2018-02-01 MED FILL — SULFAMETHOXAZOLE-TMP DS TAB: 800-160 | 7 days supply | Qty: 14 | Fill #0

## 2018-02-01 MED FILL — MUPIROCIN 2% OINTMENT: 2 | 10 days supply | Qty: 22 | Fill #0

## 2018-02-01 NOTE — Progress Notes (Addendum)
Assessment & Plan:  Madison [name] was seen today for follow-up.  Diagnoses and all orders for this visit:  Chronic bilateral low back pain with left-sided sciatica -     cyclobenzaprine (FLEXERIL) 5 MG tablet; Take 1 tablet (5 mg total) by mouth 3 (three) times daily as needed for muscle spasms. Work on losing weight to help reduce back pain. May alternate with heat and ice application for pain relief. May also alternate with acetaminophen and Ibuprofen as prescribed for back pain. Other alternatives include massage, acupuncture and water aerobics.  You must stay active and avoid a sedentary lifestyle. STOP SMOKING!!!  Morbid obesity with BMI of 40.0-44.9, adult ([place]) Discussed diet and exercise. BMI 44. Instructed: You must burn more calories than you eat. Losing 5 percent of your body weight should be considered a success. In the longer term, losing more than 15 percent of your body weight and staying at this weight is an extremely good result. However, keep in mind that even losing 5 percent of your body weight leads to important health benefits, so try not to get discouraged if you're not able to lose more than this. Will recheck weight in 3-6 months.   Skin infection, bacterial -     sulfamethoxazole-trimethoprim (BACTRIM DS,SEPTRA DS) 800-160 MG tablet; Take 1 tablet by mouth 2 (two) times daily for 7 days. -     mupirocin ointment (BACTROBAN) 2 %; Apply to the affected area twice a day    Patient has been counseled on age-appropriate routine health concerns for screening and prevention. These are reviewed and up-to-date. Referrals have been placed accordingly. Immunizations are up-to-date or declined.    Subjective:   Chief Complaint  Patient presents with  . Follow-up    Pt. is here to follow-up on weight loss.    HPI Madison [name] 42 y.o. female presents to office today for follow up to weight management. She recently lost her job Patient has been advised to apply for  financial assistance and schedule to see our Development worker, community.    Morbid Obesity She started phentermine in July for weight loss. When I saw her that month she stated that her back pain had been preventing her from exercising.  Today she states she never picked up the phentermine due to financial issues.  We discussed dietary and exercise modifications today.  At this time we will not resume phentermine. Wt Readings from Last 3 Encounters:  02/01/18 241 lb 3.2 oz (109.4 kg)  11/01/17 240 lb 9.6 oz (109.1 kg)  08/08/17 243 lb 3.2 oz (110.3 kg)    Chronic back Pain Chronic and ongoing for several years. She has been taking flexeril for pain relief with little improvement. Aggravating factors: prolonged sitting and standing, bending, turning, twisting. Medications tried in the past include tramadol, voltaren, gabapentin, naproxen and steroids. Weight is greatly contributing to her back pain.  MRI Lumbar Spine 09-24-2014 1. 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 2. No other significant acquired findings. 3. Congenitally short pedicles. She has not followed up with spine specialist for her back due to lack of insurance. She still has not applied for the orange card.  Patient has been advised to apply for financial assistance and schedule to see our financial counselor.  She will need additional imaging and likely referral to a spine specialist if she is approved for the financial assistance.  Skin Infection Skin rash for several months. She  has large patches of hyperpigmented dry skin with pruritis and maculopapular lesions on her right elbow as well as lower right leg.  Medications tried in the past include clobetasol cream 0.05%, Lamisil cream 1%, triamcinolone ointment 0.5% with no relief of symptoms.  She needs to see a dermatologist however there are financial limitations with this. Today we will treat as a bacterial  skin infection   Review of Systems  Constitutional: Negative for fever, malaise/fatigue and weight loss.  HENT: Negative.  Negative for nosebleeds.   Eyes: Negative.  Negative for blurred vision, double vision and photophobia.  Respiratory: Negative.  Negative for cough and shortness of breath.   Cardiovascular: Negative.  Negative for chest pain, palpitations and leg swelling.  Gastrointestinal: Negative.  Negative for heartburn, nausea and vomiting.  Musculoskeletal: Positive for back pain and myalgias.  Skin: Positive for itching and rash.  Neurological: Positive for tingling and sensory change. Negative for dizziness, focal weakness, seizures and headaches.  Psychiatric/Behavioral: Negative.  Negative for suicidal ideas.    Past Medical History:  Diagnosis Date  . Uterine fibroid     Past Surgical History:  Procedure Laterality Date  . ABDOMINAL HYSTERECTOMY  2013  . NO PAST SURGERIES      Family History  Problem Relation Age of Onset  . Hypertension Mother   . Hypertension Sister     Social History Reviewed with no changes to be made today.   Outpatient Medications Prior to Visit  Medication Sig Dispense Refill  . phentermine 37.5 MG capsule Take 1 capsule (37.5 mg total) by mouth every morning. (Patient not taking: Reported on 02/01/2018) 30 capsule 1   No facility-administered medications prior to visit.     No Known Allergies     Objective:    BP 138/90 (BP Location: Left Arm, Patient Position: Sitting, Cuff Size: Large)   Pulse 99   Temp 98.9 F (37.2 C) (Oral)   Ht 5\' 2"  (1.575 m)   Wt 241 lb 3.2 oz (109.4 kg)   LMP 01/17/2012   SpO2 94%   BMI 44.12 kg/m   Wt Readings from Last 3 Encounters:  02/01/18 241 lb 3.2 oz (109.4 kg)  11/01/17 240 lb 9.6 oz (109.1 kg)  08/08/17 243 lb 3.2 oz (110.3 kg)     Physical Exam  Constitutional: She is oriented to person, place, and time. She appears well-developed and well-nourished. She is cooperative.    HENT:  Head: Normocephalic and atraumatic.  Eyes: EOM are normal.  Neck: Normal range of motion.  Cardiovascular: Normal rate, regular rhythm and normal heart sounds. Exam reveals no gallop and no friction rub.  No murmur heard. Pulmonary/Chest: Effort normal and breath sounds normal. No tachypnea. No respiratory distress. She has no decreased breath sounds. She has no wheezes. She has no rhonchi. She has no rales. She exhibits no tenderness.  Abdominal: Bowel sounds are normal.  Musculoskeletal: Normal range of motion. She exhibits no edema.       Arms:      Legs: Neurological: She is alert and oriented to person, place, and time. Coordination normal.  Skin: Skin is warm and dry. Rash noted. Rash is maculopapular. No erythema. No pallor.  Psychiatric: She has a normal mood and affect. Her behavior is normal. Judgment and thought content normal.  Nursing note and vitals reviewed.        Patient has been counseled extensively about nutrition and exercise as well as the importance of adherence with medications and regular follow-up.  The patient was given clear instructions to go to ER or return to medical center if symptoms don't improve, worsen or new problems develop. The patient verbalized understanding.   Follow-up: Return if symptoms worsen or fail to improve.   Gildardo Pounds, FNP-BC [facility] [place], [place]   02/01/2018, 7:34 PM

## 2018-07-02 DIAGNOSIS — K529 Noninfective gastroenteritis and colitis, unspecified: Secondary | ICD-10-CM

## 2018-07-02 HISTORY — DX: Noninfective gastroenteritis and colitis, unspecified: K52.9

## 2018-11-08 ENCOUNTER — Encounter (HOSPITAL_COMMUNITY): Payer: Self-pay | Admitting: *Deleted

## 2018-11-08 ENCOUNTER — Inpatient Hospital Stay (HOSPITAL_COMMUNITY)
Admission: EM | Admit: 2018-11-08 | Discharge: 2018-11-10 | DRG: 392 | Disposition: A | Discharge status: Home / Self Care | Payer: 59 | Attending: Internal Medicine | Admitting: Internal Medicine

## 2018-11-08 ENCOUNTER — Other Ambulatory Visit: Payer: Self-pay

## 2018-11-08 ENCOUNTER — Emergency Department (HOSPITAL_COMMUNITY): Payer: 59

## 2018-11-08 DIAGNOSIS — Z1159 Encounter for screening for other viral diseases: Secondary | ICD-10-CM

## 2018-11-08 DIAGNOSIS — I10 Essential (primary) hypertension: Secondary | ICD-10-CM | POA: Diagnosis present

## 2018-11-08 DIAGNOSIS — F17210 Nicotine dependence, cigarettes, uncomplicated: Secondary | ICD-10-CM | POA: Diagnosis present

## 2018-11-08 DIAGNOSIS — Z800 Family history of malignant neoplasm of digestive organs: Secondary | ICD-10-CM

## 2018-11-08 DIAGNOSIS — M5442 Lumbago with sciatica, left side: Secondary | ICD-10-CM | POA: Diagnosis present

## 2018-11-08 DIAGNOSIS — Z6841 Body Mass Index (BMI) 40.0 and over, adult: Secondary | ICD-10-CM

## 2018-11-08 DIAGNOSIS — R300 Dysuria: Secondary | ICD-10-CM | POA: Diagnosis present

## 2018-11-08 DIAGNOSIS — K529 Noninfective gastroenteritis and colitis, unspecified: Principal | ICD-10-CM | POA: Diagnosis present

## 2018-11-08 DIAGNOSIS — F17200 Nicotine dependence, unspecified, uncomplicated: Secondary | ICD-10-CM | POA: Diagnosis present

## 2018-11-08 DIAGNOSIS — Z8249 Family history of ischemic heart disease and other diseases of the circulatory system: Secondary | ICD-10-CM

## 2018-11-08 DIAGNOSIS — N390 Urinary tract infection, site not specified: Secondary | ICD-10-CM | POA: Diagnosis present

## 2018-11-08 DIAGNOSIS — G8929 Other chronic pain: Secondary | ICD-10-CM | POA: Diagnosis present

## 2018-11-08 DIAGNOSIS — M549 Dorsalgia, unspecified: Secondary | ICD-10-CM | POA: Diagnosis present

## 2018-11-08 LAB — URINALYSIS, ROUTINE W REFLEX MICROSCOPIC
Bilirubin Urine: NEGATIVE
Glucose, UA: NEGATIVE mg/dL
Ketones, ur: NEGATIVE mg/dL
Leukocytes,Ua: NEGATIVE
Nitrite: NEGATIVE
Protein, ur: NEGATIVE mg/dL
Specific Gravity, Urine: 1.023 (ref 1.005–1.030)
pH: 5 (ref 5.0–8.0)

## 2018-11-08 LAB — COMPREHENSIVE METABOLIC PANEL
ALT: 22 U/L (ref 0–44)
AST: 19 U/L (ref 15–41)
Albumin: 3.1 g/dL — ABNORMAL LOW (ref 3.5–5.0)
Alkaline Phosphatase: 86 U/L (ref 38–126)
Anion gap: 9 (ref 5–15)
BUN: 14 mg/dL (ref 6–20)
CO2: 23 mmol/L (ref 22–32)
Calcium: 8.8 mg/dL — ABNORMAL LOW (ref 8.9–10.3)
Chloride: 101 mmol/L (ref 98–111)
Creatinine, Ser: 1.15 mg/dL — ABNORMAL HIGH (ref 0.44–1.00)
GFR calc Af Amer: >60 mL/min (ref >60)
GFR calc non Af Amer: 58 mL/min — ABNORMAL LOW (ref >60)
Glucose, Bld: 103 mg/dL — ABNORMAL HIGH (ref 70–99)
Potassium: 4.7 mmol/L (ref 3.5–5.1)
Sodium: 133 mmol/L — ABNORMAL LOW (ref 135–145)
Total Bilirubin: 0.4 mg/dL (ref 0.3–1.2)
Total Protein: 8.2 g/dL — ABNORMAL HIGH (ref 6.5–8.1)

## 2018-11-08 LAB — SARS CORONAVIRUS 2 BY RT PCR (HOSPITAL ORDER, PERFORMED IN ~~LOC~~ HOSPITAL LAB): SARS Coronavirus 2: NEGATIVE

## 2018-11-08 LAB — CBC
HCT: 38.7 % (ref 36.0–46.0)
Hemoglobin: 12.5 g/dL (ref 12.0–15.0)
MCH: 26.4 pg (ref 26.0–34.0)
MCHC: 32.3 g/dL (ref 30.0–36.0)
MCV: 81.8 fL (ref 80.0–100.0)
Platelets: 441 10*3/uL — ABNORMAL HIGH (ref 150–400)
RBC: 4.73 MIL/uL (ref 3.87–5.11)
RDW: 14.3 % (ref 11.5–15.5)
WBC: 15.6 10*3/uL — ABNORMAL HIGH (ref 4.0–10.5)
nRBC: 0 % (ref 0.0–0.2)

## 2018-11-08 LAB — LIPASE, BLOOD: Lipase: 27 U/L (ref 11–51)

## 2018-11-08 LAB — I-STAT BETA HCG BLOOD, ED (MC, WL, AP ONLY): I-stat hCG, quantitative: <5 m[IU]/mL (ref <5)

## 2018-11-08 LAB — LACTIC ACID, PLASMA
Lactic Acid, Venous: 0.9 mmol/L (ref 0.5–1.9)
Lactic Acid, Venous: 0.8 mmol/L (ref 0.5–1.9)

## 2018-11-08 MED ORDER — KETOROLAC TROMETHAMINE 30 MG/ML IJ SOLN
30.0000 mg | Freq: Four times a day (QID) | INTRAMUSCULAR | Status: DC | PRN
  Administered 2018-11-08 – 2018-11-10 (×5): 30 mg via INTRAVENOUS
  Filled 2018-11-08 (×5): qty 1

## 2018-11-08 MED ORDER — SODIUM CHLORIDE 0.9 % IV BOLUS
1000.0000 mL | Freq: Once | INTRAVENOUS | Status: AC
  Administered 2018-11-08: 1000 mL via INTRAVENOUS

## 2018-11-08 MED ORDER — IOPAMIDOL (ISOVUE-300) INJECTION 61%
100.0000 mL | Freq: Once | INTRAVENOUS | Status: AC | PRN
  Administered 2018-11-08: 100 mL via INTRAVENOUS

## 2018-11-08 MED ORDER — CIPROFLOXACIN IN D5W 400 MG/200ML IV SOLN
400.0000 mg | Freq: Once | INTRAVENOUS | Status: AC
  Administered 2018-11-08: 400 mg via INTRAVENOUS
  Filled 2018-11-08: qty 200

## 2018-11-08 MED ORDER — CHLORHEXIDINE GLUCONATE 0.12 % MT SOLN
15.0000 mL | Freq: Two times a day (BID) | OROMUCOSAL | Status: DC
  Administered 2018-11-08 – 2018-11-10 (×4): 15 mL via OROMUCOSAL
  Filled 2018-11-08 (×4): qty 15

## 2018-11-08 MED ORDER — METRONIDAZOLE IN NACL 5-0.79 MG/ML-% IV SOLN
500.0000 mg | Freq: Once | INTRAVENOUS | Status: AC
  Administered 2018-11-08: 500 mg via INTRAVENOUS
  Filled 2018-11-08: qty 100

## 2018-11-08 MED ORDER — ORAL CARE MOUTH RINSE
15.0000 mL | Freq: Two times a day (BID) | OROMUCOSAL | Status: DC
  Administered 2018-11-09 (×2): 15 mL via OROMUCOSAL

## 2018-11-08 MED ORDER — MORPHINE SULFATE (PF) 2 MG/ML IV SOLN
2.0000 mg | INTRAVENOUS | Status: DC | PRN
  Administered 2018-11-08 – 2018-11-10 (×12): 2 mg via INTRAVENOUS
  Filled 2018-11-08 (×12): qty 1

## 2018-11-08 MED ORDER — SODIUM CHLORIDE 0.9% FLUSH
3.0000 mL | Freq: Once | INTRAVENOUS | Status: AC
  Administered 2018-11-08: 3 mL via INTRAVENOUS

## 2018-11-08 MED ORDER — LACTATED RINGERS IV SOLN
INTRAVENOUS | Status: DC
  Administered 2018-11-08 – 2018-11-10 (×3): via INTRAVENOUS

## 2018-11-08 MED ORDER — METRONIDAZOLE IN NACL 5-0.79 MG/ML-% IV SOLN
500.0000 mg | Freq: Three times a day (TID) | INTRAVENOUS | Status: DC
  Administered 2018-11-08 – 2018-11-10 (×6): 500 mg via INTRAVENOUS
  Filled 2018-11-08 (×6): qty 100

## 2018-11-08 MED ORDER — ONDANSETRON HCL 4 MG PO TABS
4.0000 mg | ORAL_TABLET | Freq: Four times a day (QID) | ORAL | Status: DC | PRN
  Administered 2018-11-10: 13:00:00 4 mg via ORAL
  Filled 2018-11-08: qty 1

## 2018-11-08 MED ORDER — ACETAMINOPHEN 325 MG PO TABS: 650.0000 mg | ORAL_TABLET | Freq: Four times a day (QID) | ORAL | Status: DC | PRN

## 2018-11-08 MED ORDER — MORPHINE SULFATE (PF) 4 MG/ML IV SOLN
4.0000 mg | Freq: Once | INTRAVENOUS | Status: AC
  Administered 2018-11-08: 4 mg via INTRAVENOUS
  Filled 2018-11-08: qty 1

## 2018-11-08 MED ORDER — HYDROMORPHONE HCL 1 MG/ML IJ SOLN: 1.0000 mg | INTRAMUSCULAR | Status: DC | PRN

## 2018-11-08 MED ORDER — ENOXAPARIN SODIUM 40 MG/0.4ML ~~LOC~~ SOLN
40.0000 mg | SUBCUTANEOUS | Status: DC
  Administered 2018-11-08 – 2018-11-09 (×2): 40 mg via SUBCUTANEOUS
  Filled 2018-11-08 (×2): qty 0.4

## 2018-11-08 MED ORDER — PROMETHAZINE HCL 25 MG RE SUPP
25.0000 mg | Freq: Four times a day (QID) | RECTAL | Status: DC | PRN
  Administered 2018-11-08: 25 mg via RECTAL
  Filled 2018-11-08 (×2): qty 1

## 2018-11-08 MED ORDER — ONDANSETRON HCL 4 MG/2ML IJ SOLN
4.0000 mg | Freq: Once | INTRAMUSCULAR | Status: AC
  Administered 2018-11-08: 08:00:00 4 mg via INTRAVENOUS
  Filled 2018-11-08: qty 2

## 2018-11-08 MED ORDER — HYDROMORPHONE HCL 1 MG/ML IJ SOLN
1.0000 mg | Freq: Once | INTRAMUSCULAR | Status: AC
  Administered 2018-11-08: 1 mg via INTRAVENOUS
  Filled 2018-11-08: qty 1

## 2018-11-08 MED ORDER — CIPROFLOXACIN IN D5W 400 MG/200ML IV SOLN
400.0000 mg | Freq: Two times a day (BID) | INTRAVENOUS | Status: DC
  Administered 2018-11-08 – 2018-11-10 (×4): 400 mg via INTRAVENOUS
  Filled 2018-11-08 (×5): qty 200

## 2018-11-08 MED ORDER — ACETAMINOPHEN 650 MG RE SUPP: 650.0000 mg | Freq: Four times a day (QID) | RECTAL | Status: DC | PRN

## 2018-11-08 MED ORDER — ONDANSETRON HCL 4 MG/2ML IJ SOLN
4.0000 mg | Freq: Four times a day (QID) | INTRAMUSCULAR | Status: DC | PRN
  Administered 2018-11-08 – 2018-11-10 (×2): 4 mg via INTRAVENOUS
  Filled 2018-11-08 (×2): qty 2

## 2018-11-08 MED ORDER — HYDROMORPHONE HCL 1 MG/ML IJ SOLN
1.0000 mg | INTRAMUSCULAR | Status: DC | PRN
  Administered 2018-11-08: 1 mg via INTRAVENOUS
  Filled 2018-11-08: qty 1

## 2018-11-08 MED ORDER — ONDANSETRON HCL 4 MG/2ML IJ SOLN: 4.0000 mg | Freq: Four times a day (QID) | INTRAMUSCULAR | Status: DC | PRN

## 2018-11-08 NOTE — Progress Notes (Signed)
NEW ADMISSION NOTE New Admission Note:   Arrival Method: via stretcher from the ED  Mental Orientation: axox4 Telemetry: none  Assessment: Completed Skin: see skin assessment  IV:RAC Pain: 9/10 abd  Tubes: none  Safety Measures: Safety Fall Prevention Plan has been discussed Admission: to be completed [facility] [place] Orientation: Patient has been orientated to the room, unit and staff.  Family:  Orders have been reviewed and implemented. Will continue to monitor the patient. Call light has been placed within reach and bed alarm has been activated.   Paulla Fore, RN

## 2018-11-08 NOTE — ED Notes (Signed)
Dr.Lockwood at bedside  

## 2018-11-08 NOTE — Progress Notes (Addendum)
Notified MD Lorin Mercy- pt c/o pain 9/10 in mid lower abdomen. Pt recievd Toradol IV PRN at 1347 and pt is unable to take PO Tylenol due to nausea. Asked if pt could have another medicine for pain.     63 notified MD that pt had vomited twice and is c/o sharp abd pain. Morphine given at 1529 and zofran given at 1424. No other PRN meds at this time to administer.    Paulla Fore, RN

## 2018-11-08 NOTE — ED Notes (Signed)
Patient transported to CT 

## 2018-11-08 NOTE — H&P (Signed)
History and Physical    YAREL KILCREASE QIH:[id number] DOB: [birth year] DOA: 11/08/2018  PCP: Gildardo Pounds, NP; changed to [facility], has not been yet but has appointment on 7/14 Consultants:  None Patient coming from:  Home - lives alone; NOK: [place]ael[place], [place], [phone]  Chief Complaint: abdominal pain  HPI: ROMELL CAVANAH is a 43 y.o. female with medical history significant of morbid obesity (BMI 45) presenting with abdominal pain.  Patient went to UC last Tuesday for abdominal pain x 2-3 days.  She was given an antibiotic.  She called yesterday - she was done with abx but the pain was worsening and radiating up in her abdomen.  She was instructed to come to the ER.  The pain was unbearable while at work last night.  No fever.  She started with n/v in the ER after pain medication.  She is having severe pain.  She has been taking MOM because UC suggested she take it, "she told me I was backed up."  It worked and she had BMs after - last took yesterday AM with normal BM last evening about 530.   ED Course:  Colitis, intractable pain.  No surgical abdomen.  Given Cipro/Flagyl.     Review of Systems: As per HPI; otherwise review of systems reviewed and negative.   Ambulatory Status:  Ambulates without assistance  Past Medical History:  Diagnosis Date  . Uterine fibroid     Past Surgical History:  Procedure Laterality Date  . ABDOMINAL HYSTERECTOMY  2013    Social History   Socioeconomic History  . Marital status: Single    Spouse name: Not on file  . Number of children: Not on file  . Years of education: Not on file  . Highest education level: Not on file  Occupational History  . Occupation: dispatcher  Social Needs  . Financial resource strain: Not on file  . Food insecurity    Worry: Not on file    Inability: Not on file  . Transportation needs    Medical: Not on file    Non-medical: Not on file  Tobacco Use  . Smoking status: Current Some Day Smoker   Packs/day: 1.00    Years: 22.00    Pack years: 22.00    Types: Cigarettes  . Smokeless tobacco: Never Used  Substance and Sexual Activity  . Alcohol use: Yes    Comment: none recent  . Drug use: No  . Sexual activity: Not Currently    Birth control/protection: Condom    Comment: denies sxxual activity x6 months  Lifestyle  . Physical activity    Days per week: Not on file    Minutes per session: Not on file  . Stress: Not on file  Relationships  . Social Herbalist on phone: Not on file    Gets together: Not on file    Attends religious service: Not on file    Active member of club or organization: Not on file    Attends meetings of clubs or organizations: Not on file    Relationship status: Not on file  . Intimate partner violence    Fear of current or ex partner: Not on file    Emotionally abused: Not on file    Physically abused: Not on file    Forced sexual activity: Not on file  Other Topics Concern  . Not on file  Social History Narrative  . Not on file    No Known  Allergies  Family History  Problem Relation Age of Onset  . Hypertension Mother   . Hypertension Sister     Prior to Admission medications   Medication Sig Start Date End Date Taking? Authorizing Provider  Ibuprofen-diphenhydrAMINE Cit 200-38 MG TABS Take 1 tablet by mouth at bedtime as needed (cramps and sleep).   Yes [provider]    Physical Exam: Vitals:   11/08/18 1200 11/08/18 1230 11/08/18 1330 11/08/18 1458  BP: 119/89 (!) 134/113 118/77 125/72  Pulse: (!) 104 (!) 101 96 88  Resp:   20 13  Temp:    97.9 F (36.6 C)  TempSrc:    Oral  SpO2: 92% 94% 92% 100%  Weight:      Height:         . General:  Appears calm but ncomfortable and is NAD . Eyes:  PERRL, EOMI, normal lids, iris . ENT:  grossly normal hearing, lips & tongue, mildly dry mm . Neck:  no LAD, masses or thyromegaly . Cardiovascular:  RR with mild tachcyardia, no m/r/g. No LE edema.  Marland Kitchen  Respiratory:   CTA bilaterally with no wheezes/rales/rhonchi.  Normal respiratory effort. . Abdomen:  soft, diffusely TTP, ND, NABS, no peritoneal signs . Skin:  no rash or induration seen on limited exam . Musculoskeletal:  grossly normal tone BUE/BLE, good ROM, no bony abnormality . Psychiatric:  Blunted mood and affect, speech fluent and appropriate, AOx3 . Neurologic:  CN 2-12 grossly intact, moves all extremities in coordinated fashion, sensation intact    Radiological Exams on Admission: Ct Abdomen Pelvis W Contrast  Result Date: 11/08/2018 CLINICAL DATA:  43 year old female with abdominal pain in the mid and lower abdomen. Nausea, not improving with antibiotics. EXAM: CT ABDOMEN AND PELVIS WITH CONTRAST TECHNIQUE: Multidetector CT imaging of the abdomen and pelvis was performed using the standard protocol following bolus administration of intravenous contrast. CONTRAST:  182mL ISOVUE-300 IOPAMIDOL (ISOVUE-300) INJECTION 61% COMPARISON:  Noncontrast CT Abdomen and Pelvis 08/06/2014. FINDINGS: Lower chest: Stable and negative. Hepatobiliary: Negative liver and gallbladder. Pancreas: Negative. Spleen: Negative. Adrenals/Urinary Tract: Normal adrenal glands. Symmetric bilateral renal enhancement and contrast excretion. Decompressed renal collecting systems and proximal ureters. There is secondary inflammation of the dome of the urinary bladder (sagittal image 127) with mild bladder wall thickening. No urinary calculus identified. Stomach/Bowel: There is confluent inflammatory stranding throughout mesentery in the pelvis situated between the sigmoid colon and urinary bladder (sagittal image 123 and series 3, image 68. This stranding appears more related to the sigmoid colon than regional small bowel, although only mild sigmoid wall thickening is associated. No definite diverticula. The sigmoid is decompressed and mildly redundant. No extraluminal gas or fluid. Decompressed and negative descending colon.  Negative transverse colon. Negative right colon and appendix (series 3, image 58). Negative terminal ileum. Although some small bowel loops border the area of mesenteric stranding in the pelvis, most of the small bowel appears unaffected. A few loops may demonstrate mild secondary inflammation (coronal image 77). No dilated small bowel. Decompressed and negative stomach and duodenum. No free air. No abdominal free fluid. Vascular/Lymphatic: Suboptimal intravascular contrast bolus but the major arterial structures appear patent. Portal venous system appears grossly patent. No lymphadenopathy, small retroperitoneal lymph nodes in the abdomen and pelvis are stable. Reproductive: Surgically absent uterus as before. Ovaries appear within normal limits along the superior pelvic inlet. Other: Trace pelvic free fluid with simple fluid density on series 3, image 72. Musculoskeletal: Advanced disc degeneration at the lumbosacral junction  with vacuum disc. Mild degenerative appearing retrolisthesis there is stable. No acute osseous abnormality identified. IMPRESSION: 1. Confluent inflammatory stranding in the pelvis situated between around the sigmoid colon and secondarily involving the bladder fundus. Minimal to mild secondary inflammation of some regional small bowel. Trace free fluid. This most resembles a resolving sigmoid colitis. No other complicating features. No definite diverticula in the region. 2. Negative CT abdomen. 3. 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 Electronically Signed   By: Genevie Ann M.D.   On: 11/08/2018 08:37    EKG: not done   Labs on Admission: I have personally reviewed the available labs and imaging studies at the time of the admission.  Pertinent labs:   Na++ 133 Glucose 103 BUN 14/Creatinine 1.15/GFR >60 WBC 15.6 Platelets 441 HCG <5 UA: moderate Hgb, rare bacteria COVID negative  Assessment/Plan Principal Problem:   Colitis Active Problems:   Morbid  obesity with BMI of 40.0-44.9, adult ([place])   Current smoker   Colitis -Patient's symptoms are c/w colitis and her CT supports this as a diagnosis -She failed outpatient treatment  -Her only SIRS criteria are leukocytosis (15.6) and tachycardia (likely associated with pain -Will check lactate -For now, will give bowel rest, IVF, pain medication with Toradol/morphine, nausea medication with Zofran, and treat with Cipro/Flagyl for intraabdominal infection -If not improving, she may need CT and/or GI/surgery consultation  Morbid obesity -BMI is 45 -Needs outpatient referral for bariatric medicine and/or surgery  Tobacco dependence -Tobacco Dependence: encourage cessation.   -This was discussed with the patient and should be reviewed on an ongoing basis.   -Patch declined  Note: This patient has been tested and is negative for the novel coronavirus COVID-19.  DVT prophylaxis:  Lovenox  Code Status:  Full - confirmed with patient Family Communication: None present; she is able to communicate with her family  Disposition Plan:  Home once clinically improved Consults called: None  Admission status: It is my clinical opinion that referral for OBSERVATION is reasonable and necessary in this patient based on the above information provided. The aforementioned taken together are felt to place the patient at high risk for further clinical deterioration. However it is anticipated that the patient may be medically stable for discharge from the hospital within 24 to 48 hours.    Karmen Bongo MD [facility]   How to contact the [facility] Attending or Consulting provider [place] or covering provider during after hours [place], for this patient?  1. Check the care team in [facility] and look for a) attending/consulting [facility] provider listed and b) the [facility] team listed 2. Log into [email or web address] and use [place]'s universal password to access. If you do not have the password, please contact the hospital  operator. 3. Locate the [facility] provider you are looking for under [facility] and page to a number that you can be directly reached. 4. If you still have difficulty reaching the provider, please page the [facility] (Director on Call) for the Hospitalists listed on amion for assistance.   11/08/2018, 3:05 PM

## 2018-11-08 NOTE — ED Triage Notes (Signed)
States she went to the [facility] on tues and was told she has a poss UTI and states she was constipated, c/o lower pelvis pain and radiates into her upper abd. C/o painful urination and states she has a decreased flow. Able to bowel movement this am. .

## 2018-11-08 NOTE — ED Provider Notes (Signed)
[facility] EMERGENCY DEPARTMENT Provider Note   CSN: [id number] Arrival date & time: 11/08/18  9169    History   Chief Complaint Chief Complaint  Patient presents with   Abdominal Pain    HPI Madison [name] is a 43 y.o. female.     HPI Patient presents with concern of suprapubic and abdominal pain. Onset was 6 days ago. Initially, there was some change in urinary pattern, lower abdominal pain, and she went to urgent care. There she had x-rays, urinalysis, was told that she had a urinary tract infection.  When she started on antibiotics, has been using ibuprofen. However, since that time she has had worsening pain, severe, sharp, with waxing, waning severity, focally about the suprapubic area with radiation both bilaterally and superiorly. There is diminished ability to urinate typical amounts, ongoing/worsening nausea. No fever.  Past Medical History:  Diagnosis Date   Uterine fibroid     Patient Active Problem List   Diagnosis Date Noted   Morbid obesity with BMI of 40.0-44.9, adult ([place]) 09/12/2014   Current smoker 09/12/2014   Bilateral low back pain with left-sided sciatica 08/13/2014    Past Surgical History:  Procedure Laterality Date   ABDOMINAL HYSTERECTOMY  2013   NO PAST SURGERIES       OB History   No obstetric history on file.      Home Medications    Prior to Admission medications   Medication Sig Start Date End Date Taking? Authorizing Provider  Ibuprofen-diphenhydrAMINE Cit 200-38 MG TABS Take 1 tablet by mouth at bedtime as needed (cramps and sleep).   Yes [provider]    Family History Family History  Problem Relation Age of Onset   Hypertension Mother    Hypertension Sister     Social History Social History   Tobacco Use   Smoking status: Current Some Day Smoker    Packs/day: 1.00    Years: 22.00    Pack years: 22.00    Types: Cigarettes   Smokeless tobacco: Never Used  Substance  Use Topics   Alcohol use: Yes    Comment: none recent   Drug use: No     Allergies   Patient has no known allergies.   Review of Systems Review of Systems  Constitutional:       Per HPI, otherwise negative  HENT:       Per HPI, otherwise negative  Respiratory:       Per HPI, otherwise negative  Cardiovascular:       Per HPI, otherwise negative  Gastrointestinal: Positive for abdominal pain and nausea. Negative for vomiting.  Endocrine:       Negative aside from HPI  Genitourinary:       Neg aside from HPI   Musculoskeletal:       Per HPI, otherwise negative  Skin: Negative.   Neurological: Negative for syncope.     Physical Exam Updated Vital Signs BP 123/79    Pulse (!) 106    Temp 99.3 F (37.4 C) (Oral)    Resp 18    Ht 5\' 2"  (1.575 m)    Wt 112.5 kg    LMP 01/17/2012    SpO2 97%    BMI 45.36 kg/m   Physical Exam Vitals signs and nursing note reviewed.  Constitutional:      Appearance: She is well-developed. She is diaphoretic.  HENT:     Head: Normocephalic and atraumatic.  Eyes:     Conjunctiva/sclera: Conjunctivae  normal.  Cardiovascular:     Rate and Rhythm: Regular rhythm. Tachycardia present.  Pulmonary:     Effort: Pulmonary effort is normal. No respiratory distress.     Breath sounds: Normal breath sounds. No stridor.  Abdominal:     General: There is no distension.     Tenderness: There is generalized abdominal tenderness and tenderness in the suprapubic area.     Comments: Diffuse, mild pain, worst in the suprapubic region.  Skin:    General: Skin is warm.  Neurological:     Mental Status: She is alert and oriented to person, place, and time.     Cranial Nerves: No cranial nerve deficit.      ED Treatments / Results  Labs (all labs ordered are listed, but only abnormal results are displayed) Labs Reviewed  COMPREHENSIVE METABOLIC PANEL - Abnormal; Notable for the following components:      Result Value   Sodium 133 (*)    Glucose,  Bld 103 (*)    Creatinine, Ser 1.15 (*)    Calcium 8.8 (*)    Total Protein 8.2 (*)    Albumin 3.1 (*)    GFR calc non Af Amer 58 (*)    All other components within normal limits  CBC - Abnormal; Notable for the following components:   WBC 15.6 (*)    Platelets 441 (*)    All other components within normal limits  URINALYSIS, ROUTINE W REFLEX MICROSCOPIC - Abnormal; Notable for the following components:   APPearance HAZY (*)    Hgb urine dipstick MODERATE (*)    Bacteria, UA RARE (*)    All other components within normal limits  SARS CORONAVIRUS 2 (HOSPITAL ORDER, [facility])  LIPASE, BLOOD  I-STAT BETA HCG BLOOD, ED ([facility], [facility], [facility] ONLY)    EKG None  Radiology Ct Abdomen Pelvis W Contrast  Result Date: 11/08/2018 CLINICAL DATA:  43 year old female with abdominal pain in the mid and lower abdomen. Nausea, not improving with antibiotics. EXAM: CT ABDOMEN AND PELVIS WITH CONTRAST TECHNIQUE: Multidetector CT imaging of the abdomen and pelvis was performed using the standard protocol following bolus administration of intravenous contrast. CONTRAST:  178mL ISOVUE-300 IOPAMIDOL (ISOVUE-300) INJECTION 61% COMPARISON:  Noncontrast CT Abdomen and Pelvis 08/06/2014. FINDINGS: Lower chest: Stable and negative. Hepatobiliary: Negative liver and gallbladder. Pancreas: Negative. Spleen: Negative. Adrenals/Urinary Tract: Normal adrenal glands. Symmetric bilateral renal enhancement and contrast excretion. Decompressed renal collecting systems and proximal ureters. There is secondary inflammation of the dome of the urinary bladder (sagittal image 127) with mild bladder wall thickening. No urinary calculus identified. Stomach/Bowel: There is confluent inflammatory stranding throughout mesentery in the pelvis situated between the sigmoid colon and urinary bladder (sagittal image 123 and series 3, image 68. This stranding appears more related to the sigmoid colon than regional small  bowel, although only mild sigmoid wall thickening is associated. No definite diverticula. The sigmoid is decompressed and mildly redundant. No extraluminal gas or fluid. Decompressed and negative descending colon. Negative transverse colon. Negative right colon and appendix (series 3, image 58). Negative terminal ileum. Although some small bowel loops border the area of mesenteric stranding in the pelvis, most of the small bowel appears unaffected. A few loops may demonstrate mild secondary inflammation (coronal image 77). No dilated small bowel. Decompressed and negative stomach and duodenum. No free air. No abdominal free fluid. Vascular/Lymphatic: Suboptimal intravascular contrast bolus but the major arterial structures appear patent. Portal venous system appears grossly patent. No  lymphadenopathy, small retroperitoneal lymph nodes in the abdomen and pelvis are stable. Reproductive: Surgically absent uterus as before. Ovaries appear within normal limits along the superior pelvic inlet. Other: Trace pelvic free fluid with simple fluid density on series 3, image 72. Musculoskeletal: Advanced disc degeneration at the lumbosacral junction with vacuum disc. Mild degenerative appearing retrolisthesis there is stable. No acute osseous abnormality identified. IMPRESSION: 1. Confluent inflammatory stranding in the pelvis situated between around the sigmoid colon and secondarily involving the bladder fundus. Minimal to mild secondary inflammation of some regional small bowel. Trace free fluid. This most resembles a resolving sigmoid colitis. No other complicating features. No definite diverticula in the region. 2. Negative CT abdomen. 3. 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 Electronically Signed   By: Genevie Ann M.D.   On: 11/08/2018 08:37    Procedures Procedures (including critical care time)  Medications Ordered in ED Medications  ciprofloxacin (CIPRO) IVPB 400 mg (400 mg Intravenous New  Bag/Given 11/08/18 1006)  metroNIDAZOLE (FLAGYL) IVPB 500 mg (500 mg Intravenous New Bag/Given 11/08/18 1006)  HYDROmorphone (DILAUDID) injection 1 mg (has no administration in time range)  sodium chloride flush (NS) 0.9 % injection 3 mL (3 mLs Intravenous Given 11/08/18 0648)  ondansetron (ZOFRAN) injection 4 mg (4 mg Intravenous Given 11/08/18 0743)  morphine 4 MG/ML injection 4 mg (4 mg Intravenous Given 11/08/18 0743)  sodium chloride 0.9 % bolus 1,000 mL (0 mLs Intravenous Stopped 11/08/18 0946)  iopamidol (ISOVUE-300) 61 % injection 100 mL (100 mLs Intravenous Contrast Given 11/08/18 0826)  HYDROmorphone (DILAUDID) injection 1 mg (1 mg Intravenous Given 11/08/18 0839)     Initial Impression / Assessment and Plan / ED Course  I have reviewed the triage vital signs and the nursing notes.  Pertinent labs & imaging results that were available during my care of the patient were reviewed by me and considered in my medical decision making (see chart for details).    Patient remains uncomfortable, in spite of initial fentanyl, Zofran, fluids   Patient rated pain in spite of morphine 10:31 AM Of findings reviewed with the patient, including CT evidence for colitis, slight elevation in creatinine is consistent with patient's dehydration. Patient has received multiple doses of narcotic analgesia, has started broad-spectrum antibiotics, with Cipro, Flagyl. Given the patient's poorly controlled pain, concern for new colitis, need for ongoing monitoring, management, analgesia, antibiotics, patient will require admission for further therapy.  Final Clinical Impressions(s) / ED Diagnoses   Final diagnoses:  Colitis     Carmin Muskrat, MD 11/08/18 [phone]

## 2018-11-09 DIAGNOSIS — K529 Noninfective gastroenteritis and colitis, unspecified: Secondary | ICD-10-CM | POA: Diagnosis present

## 2018-11-09 DIAGNOSIS — I10 Essential (primary) hypertension: Secondary | ICD-10-CM | POA: Diagnosis present

## 2018-11-09 DIAGNOSIS — F17210 Nicotine dependence, cigarettes, uncomplicated: Secondary | ICD-10-CM | POA: Diagnosis present

## 2018-11-09 DIAGNOSIS — Z800 Family history of malignant neoplasm of digestive organs: Secondary | ICD-10-CM | POA: Diagnosis not present

## 2018-11-09 DIAGNOSIS — G8929 Other chronic pain: Secondary | ICD-10-CM | POA: Diagnosis present

## 2018-11-09 DIAGNOSIS — Z6841 Body Mass Index (BMI) 40.0 and over, adult: Secondary | ICD-10-CM | POA: Diagnosis not present

## 2018-11-09 DIAGNOSIS — R300 Dysuria: Secondary | ICD-10-CM | POA: Diagnosis present

## 2018-11-09 DIAGNOSIS — N390 Urinary tract infection, site not specified: Secondary | ICD-10-CM | POA: Diagnosis present

## 2018-11-09 DIAGNOSIS — Z8249 Family history of ischemic heart disease and other diseases of the circulatory system: Secondary | ICD-10-CM | POA: Diagnosis not present

## 2018-11-09 DIAGNOSIS — M549 Dorsalgia, unspecified: Secondary | ICD-10-CM | POA: Diagnosis present

## 2018-11-09 DIAGNOSIS — Z1159 Encounter for screening for other viral diseases: Secondary | ICD-10-CM | POA: Diagnosis not present

## 2018-11-09 LAB — CBC
HCT: 33.1 % — ABNORMAL LOW (ref 36.0–46.0)
Hemoglobin: 10.6 g/dL — ABNORMAL LOW (ref 12.0–15.0)
MCH: 26 pg (ref 26.0–34.0)
MCHC: 32 g/dL (ref 30.0–36.0)
MCV: 81.1 fL (ref 80.0–100.0)
Platelets: 353 10*3/uL (ref 150–400)
RBC: 4.08 MIL/uL (ref 3.87–5.11)
RDW: 14.1 % (ref 11.5–15.5)
WBC: 12.3 10*3/uL — ABNORMAL HIGH (ref 4.0–10.5)
nRBC: 0 % (ref 0.0–0.2)

## 2018-11-09 LAB — URINALYSIS, ROUTINE W REFLEX MICROSCOPIC
Bilirubin Urine: NEGATIVE
Glucose, UA: NEGATIVE mg/dL
Ketones, ur: 5 mg/dL — AB
Nitrite: NEGATIVE
Protein, ur: 30 mg/dL — AB
Specific Gravity, Urine: 1.029 (ref 1.005–1.030)
pH: 5 (ref 5.0–8.0)

## 2018-11-09 LAB — BASIC METABOLIC PANEL
Anion gap: 7 (ref 5–15)
BUN: 12 mg/dL (ref 6–20)
CO2: 24 mmol/L (ref 22–32)
Calcium: 8.2 mg/dL — ABNORMAL LOW (ref 8.9–10.3)
Chloride: 102 mmol/L (ref 98–111)
Creatinine, Ser: 0.93 mg/dL (ref 0.44–1.00)
GFR calc Af Amer: >60 mL/min (ref >60)
GFR calc non Af Amer: >60 mL/min (ref >60)
Glucose, Bld: 86 mg/dL (ref 70–99)
Potassium: 4.2 mmol/L (ref 3.5–5.1)
Sodium: 133 mmol/L — ABNORMAL LOW (ref 135–145)

## 2018-11-09 LAB — HIV ANTIBODY (ROUTINE TESTING W REFLEX): HIV Screen 4th Generation wRfx: NONREACTIVE

## 2018-11-09 MED ORDER — PHENAZOPYRIDINE HCL 100 MG PO TABS
100.0000 mg | ORAL_TABLET | Freq: Three times a day (TID) | ORAL | Status: DC
  Administered 2018-11-09 – 2018-11-10 (×3): 100 mg via ORAL
  Filled 2018-11-09 (×5): qty 1

## 2018-11-09 MED ORDER — HYDRALAZINE HCL 20 MG/ML IJ SOLN: 5.0000 mg | INTRAMUSCULAR | Status: DC | PRN

## 2018-11-09 MED ORDER — HYOSCYAMINE SULFATE 0.125 MG PO TBDP
0.2500 mg | ORAL_TABLET | Freq: Three times a day (TID) | ORAL | Status: DC
  Administered 2018-11-09 (×2): 0.25 mg via SUBLINGUAL
  Filled 2018-11-09 (×4): qty 2

## 2018-11-09 MED ORDER — PROMETHAZINE HCL 12.5 MG RE SUPP
12.5000 mg | Freq: Four times a day (QID) | RECTAL | Status: DC | PRN
  Filled 2018-11-09: qty 1

## 2018-11-09 NOTE — Progress Notes (Signed)
[facility] PROGRESS NOTE  Madison [name] EGB:[id number] DOB: 1975-05-23 DOA: 11/08/2018 PCP: Gildardo Pounds, NP  Assessment/Plan:  Colitis per CT Confluent inflammatory stranding in the pelvis situated between around the sigmoid colon and secondarily involving the bladder. She failed outpatient treatment. WBC trending down. Afebrile. Lactic acid within limits of normal. Non-toxic appearing Continues with abdominal pain and nausea. Requesting dilaudid as morphin "does not last long enough". On episode emesis last night.  -continue cipro and flagyl day #2 -IV fluids -NPO -continue morphine and toradol -no dilaudid  -added low dose phenergan supp for breakthrough nausea  Dysuria. Complains pain with urination and frequency. Urinalysis negative for infection. CT talks with inflammatory stranding involving bladder.  -post void bladder scan -repeat urinalysis -pyridium if tolerates -pain med as above -monitor urine output -IV fluids as noted above  Hypertension. No hx of same. Likely related to pain.  -monitor -improved pain control -prn hydralazine  Chronic low back pain. Appears stable. Chart review indicates meds include flexaril, neurontin, tramadol, voltaren  -monitor -pain med as noted above  Morbid obesity -BMI is 45 -Needs outpatient referral for bariatric medicine and/or surgery  Tobacco dependence -Tobacco Dependence: encourage cessation.   -This was discussed with the patient and should be reviewed on an ongoing basis.   -Patch declined  Code Status: full Family Communication: patient Disposition Plan: home hopefully 36 hours   Consultants:    Procedures:    Antibiotics:  cipro 7/8>>  Flagyl 7/8>>>  HPI/Subjective: 43 yo hx chronic back pain admitted abdominal pain/n/v/colitis per CT. cipro flagyl on board. Difficulty managing pain.   Objective: Vitals:   11/08/18 2100 11/09/18 0432  BP: 139/85 (!) 158/104  Pulse: 91 84  Resp: 20 20   Temp: 98.6 F (37 C) 98.1 F (36.7 C)  SpO2: 98% 97%    Intake/Output Summary (Last 24 hours) at 11/09/2018 0936 Last data filed at 11/08/2018 1116 Gross per 24 hour  Intake 2264.6 ml  Output -  Net 2264.6 ml   Filed Weights   11/08/18 3710  Weight: 112.5 kg    Exam:   General:  Awake alert no acute distress  Cardiovascular: rrr no mgr no LE edema  Respiratory: normal effort BS clear bilaterally no wheeze  Abdomen: obese soft +BS sluggish lower quadrants. Quite tender throughout with palpation no guarding or rebounding  Musculoskeletal: joints without swelling/erythema   Data Reviewed: Basic Metabolic Panel: Recent Labs  Lab 11/08/18 0628 11/09/18 0302  NA 133* 133*  K 4.7 4.2  CL 101 102  CO2 23 24  GLUCOSE 103* 86  BUN 14 12  CREATININE 1.15* 0.93  CALCIUM 8.8* 8.2*   Liver Function Tests: Recent Labs  Lab 11/08/18 0628  AST 19  ALT 22  ALKPHOS 86  BILITOT 0.4  PROT 8.2*  ALBUMIN 3.1*   Recent Labs  Lab 11/08/18 0628  LIPASE 27   No results for input(s): AMMONIA in the last 168 hours. CBC: Recent Labs  Lab 11/08/18 0628 11/09/18 0302  WBC 15.6* 12.3*  HGB 12.5 10.6*  HCT 38.7 33.1*  MCV 81.8 81.1  PLT 441* 353   Cardiac Enzymes: No results for input(s): CKTOTAL, CKMB, CKMBINDEX, TROPONINI in the last 168 hours. BNP (last 3 results) No results for input(s): BNP in the last 8760 hours.  ProBNP (last 3 results) No results for input(s): PROBNP in the last 8760 hours.  CBG: No results for input(s): GLUCAP in the last 168 hours.  Recent Results (from the past  240 hour(s))  SARS Coronavirus 2 (CEPHEID - Performed in [place] hospital lab), Hosp Order     Status: None   Collection Time: 11/08/18 10:40 AM   Specimen: Nasopharyngeal Swab  Result Value Ref Range Status   SARS Coronavirus 2 NEGATIVE NEGATIVE Final    Comment: (NOTE) If result is NEGATIVE SARS-CoV-2 target nucleic acids are NOT DETECTED. The SARS-CoV-2 RNA is  generally detectable in upper and lower  respiratory specimens during the acute phase of infection. The lowest  concentration of SARS-CoV-2 viral copies this assay can detect is 250  copies / mL. A negative result does not preclude SARS-CoV-2 infection  and should not be used as the sole basis for treatment or other  patient management decisions.  A negative result may occur with  improper specimen collection / handling, submission of specimen other  than nasopharyngeal swab, presence of viral mutation(s) within the  areas targeted by this assay, and inadequate number of viral copies  (<250 copies / mL). A negative result must be combined with clinical  observations, patient history, and epidemiological information. If result is POSITIVE SARS-CoV-2 target nucleic acids are DETECTED. The SARS-CoV-2 RNA is generally detectable in upper and lower  respiratory specimens dur ing the acute phase of infection.  Positive  results are indicative of active infection with SARS-CoV-2.  Clinical  correlation with patient history and other diagnostic information is  necessary to determine patient infection status.  Positive results do  not rule out bacterial infection or co-infection with other viruses. If result is PRESUMPTIVE POSTIVE SARS-CoV-2 nucleic acids MAY BE PRESENT.   A presumptive positive result was obtained on the submitted specimen  and confirmed on repeat testing.  While 2019 novel coronavirus  (SARS-CoV-2) nucleic acids may be present in the submitted sample  additional confirmatory testing may be necessary for epidemiological  and / or clinical management purposes  to differentiate between  SARS-CoV-2 and other Sarbecovirus currently known to infect humans.  If clinically indicated additional testing with an alternate test  methodology [phone]) is advised. The SARS-CoV-2 RNA is generally  detectable in upper and lower respiratory sp ecimens during the acute  phase of  infection. The expected result is Negative. Fact Sheet for Patients:  StrictlyIdeas.no Fact Sheet for Healthcare Providers: BankingDealers.co.za This test is not yet approved or cleared by the [place] FDA and has been authorized for detection and/or diagnosis of SARS-CoV-2 by FDA under an Emergency Use Authorization (EUA).  This EUA will remain in effect (meaning this test can be used) for the duration of the COVID-19 declaration under Section 564(b)(1) of the Act, 21 U.S.C. section 360bbb-3(b)(1), unless the authorization is terminated or revoked sooner. Performed at [facility], [address], [place], [place] [address]      Studies: Ct Abdomen Pelvis W Contrast  Result Date: 11/08/2018 CLINICAL DATA:  43 year old female with abdominal pain in the mid and lower abdomen. Nausea, not improving with antibiotics. EXAM: CT ABDOMEN AND PELVIS WITH CONTRAST TECHNIQUE: Multidetector CT imaging of the abdomen and pelvis was performed using the standard protocol following bolus administration of intravenous contrast. CONTRAST:  145mL ISOVUE-300 IOPAMIDOL (ISOVUE-300) INJECTION 61% COMPARISON:  Noncontrast CT Abdomen and Pelvis 08/06/2014. FINDINGS: Lower chest: Stable and negative. Hepatobiliary: Negative liver and gallbladder. Pancreas: Negative. Spleen: Negative. Adrenals/Urinary Tract: Normal adrenal glands. Symmetric bilateral renal enhancement and contrast excretion. Decompressed renal collecting systems and proximal ureters. There is secondary inflammation of the dome of the urinary bladder (sagittal image 127) with  mild bladder wall thickening. No urinary calculus identified. Stomach/Bowel: There is confluent inflammatory stranding throughout mesentery in the pelvis situated between the sigmoid colon and urinary bladder (sagittal image 123 and series 3, image 68. This stranding appears more related to the sigmoid colon than regional small  bowel, although only mild sigmoid wall thickening is associated. No definite diverticula. The sigmoid is decompressed and mildly redundant. No extraluminal gas or fluid. Decompressed and negative descending colon. Negative transverse colon. Negative right colon and appendix (series 3, image 58). Negative terminal ileum. Although some small bowel loops border the area of mesenteric stranding in the pelvis, most of the small bowel appears unaffected. A few loops may demonstrate mild secondary inflammation (coronal image 77). No dilated small bowel. Decompressed and negative stomach and duodenum. No free air. No abdominal free fluid. Vascular/Lymphatic: Suboptimal intravascular contrast bolus but the major arterial structures appear patent. Portal venous system appears grossly patent. No lymphadenopathy, small retroperitoneal lymph nodes in the abdomen and pelvis are stable. Reproductive: Surgically absent uterus as before. Ovaries appear within normal limits along the superior pelvic inlet. Other: Trace pelvic free fluid with simple fluid density on series 3, image 72. Musculoskeletal: Advanced disc degeneration at the lumbosacral junction with vacuum disc. Mild degenerative appearing retrolisthesis there is stable. No acute osseous abnormality identified. IMPRESSION: 1. Confluent inflammatory stranding in the pelvis situated between around the sigmoid colon and secondarily involving the bladder fundus. Minimal to mild secondary inflammation of some regional small bowel. Trace free fluid. This most resembles a resolving sigmoid colitis. No other complicating features. No definite diverticula in the region. 2. Negative CT abdomen. 3. 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 Electronically Signed   By: Genevie Ann M.D.   On: 11/08/2018 08:37    Scheduled Meds: . chlorhexidine  15 mL Mouth Rinse BID  . enoxaparin (LOVENOX) injection  40 mg Subcutaneous Q24H  . mouth rinse  15 mL Mouth Rinse  [id number]  . phenazopyridine  100 mg Oral TID WC   Continuous Infusions: . ciprofloxacin 400 mg (11/08/18 2227)  . lactated ringers 100 mL/hr at 11/08/18 1349  . metronidazole 500 mg (11/09/18 0211)    Principal Problem:   Colitis Active Problems:   Dysuria   Bilateral low back pain with left-sided sciatica   Essential hypertension   Morbid obesity with BMI of 40.0-44.9, adult ([place])   Current smoker    Time spent: 54 minutes    Terrell Hills NP  [facility] If 7PM-7AM, please contact night-coverage at [email or web address], password [facility] 11/09/2018, 9:36 AM  LOS: 0 days

## 2018-11-09 NOTE — Plan of Care (Signed)
  Problem: Pain Managment: Goal: General experience of comfort will improve Outcome: Progressing   Problem: Safety: Goal: Ability to remain free from injury will improve Outcome: Progressing   Problem: Skin Integrity: Goal: Risk for impaired skin integrity will decrease Outcome: Progressing   

## 2018-11-10 ENCOUNTER — Encounter (HOSPITAL_COMMUNITY): Payer: Self-pay | Admitting: *Deleted

## 2018-11-10 LAB — BASIC METABOLIC PANEL
Anion gap: 11 (ref 5–15)
BUN: 9 mg/dL (ref 6–20)
CO2: 22 mmol/L (ref 22–32)
Calcium: 8.1 mg/dL — ABNORMAL LOW (ref 8.9–10.3)
Chloride: 102 mmol/L (ref 98–111)
Creatinine, Ser: 0.94 mg/dL (ref 0.44–1.00)
GFR calc Af Amer: >60 mL/min (ref >60)
GFR calc non Af Amer: >60 mL/min (ref >60)
Glucose, Bld: 78 mg/dL (ref 70–99)
Potassium: 4.3 mmol/L (ref 3.5–5.1)
Sodium: 135 mmol/L (ref 135–145)

## 2018-11-10 LAB — CBC
HCT: 33.1 % — ABNORMAL LOW (ref 36.0–46.0)
Hemoglobin: 10.7 g/dL — ABNORMAL LOW (ref 12.0–15.0)
MCH: 26.4 pg (ref 26.0–34.0)
MCHC: 32.3 g/dL (ref 30.0–36.0)
MCV: 81.5 fL (ref 80.0–100.0)
Platelets: 371 10*3/uL (ref 150–400)
RBC: 4.06 MIL/uL (ref 3.87–5.11)
RDW: 13.8 % (ref 11.5–15.5)
WBC: 12.7 10*3/uL — ABNORMAL HIGH (ref 4.0–10.5)
nRBC: 0 % (ref 0.0–0.2)

## 2018-11-10 MED ORDER — HYOSCYAMINE SULFATE 0.125 MG SL SUBL: 0.2500 mg | SUBLINGUAL_TABLET | Freq: Three times a day (TID) | SUBLINGUAL | 0 refills | Status: DC

## 2018-11-10 MED ORDER — ONDANSETRON HCL 4 MG PO TABS: 4.0000 mg | ORAL_TABLET | Freq: Four times a day (QID) | ORAL | 0 refills | Status: DC | PRN

## 2018-11-10 MED ORDER — MORPHINE SULFATE (PF) 2 MG/ML IV SOLN
1.0000 mg | INTRAVENOUS | Status: DC | PRN
  Administered 2018-11-10: 1 mg via INTRAVENOUS
  Filled 2018-11-10: qty 1

## 2018-11-10 MED ORDER — METRONIDAZOLE 500 MG PO TABS: 500.0000 mg | ORAL_TABLET | Freq: Three times a day (TID) | ORAL | 0 refills | Status: AC

## 2018-11-10 MED ORDER — OXYCODONE-ACETAMINOPHEN 5-325 MG PO TABS
1.0000 | ORAL_TABLET | ORAL | Status: DC | PRN
  Administered 2018-11-10: 1 via ORAL
  Filled 2018-11-10: qty 1

## 2018-11-10 MED ORDER — HYOSCYAMINE SULFATE 0.125 MG SL SUBL
0.2500 mg | SUBLINGUAL_TABLET | Freq: Three times a day (TID) | SUBLINGUAL | Status: DC
  Filled 2018-11-10 (×3): qty 2

## 2018-11-10 MED ORDER — OXYCODONE-ACETAMINOPHEN 5-325 MG PO TABS: 1.0000 | ORAL_TABLET | ORAL | 0 refills | Status: DC | PRN

## 2018-11-10 MED ORDER — ACETAMINOPHEN 325 MG PO TABS: 650.0000 mg | ORAL_TABLET | Freq: Four times a day (QID) | ORAL | Status: DC | PRN

## 2018-11-10 MED ORDER — CIPROFLOXACIN HCL 500 MG PO TABS: 500.0000 mg | ORAL_TABLET | Freq: Two times a day (BID) | ORAL | 0 refills | Status: AC

## 2018-11-10 NOTE — Discharge Summary (Signed)
Physician Discharge Summary  Madison [name] IRS:[id number] DOB: [birth year] DOA: 11/08/2018  PCP: Gildardo Pounds, NP  Admit date: 11/08/2018 Discharge date: 11/10/2018  Time spent: 45 minutes  Recommendations for Outpatient Follow-up:  1. Follow up with PCP 1-2 weeks for evaluation of symptoms 2. Take medications as directed 3. Soft diet to advance slowly as tolerated.   Discharge Diagnoses:  Principal Problem:   Colitis Active Problems:   Dysuria   Bilateral low back pain with left-sided sciatica   Essential hypertension   Morbid obesity with BMI of 40.0-44.9, adult [facility])   Current smoker   Discharge Condition: stable  Diet recommendation: tolerating soft diet at discharge  [facility] Weights   11/08/18 0613  Weight: 112.5 kg    History of present illness:  Madison [name] is a 43 y.o. female with medical history significant of morbid obesity (BMI 45) presented 7/8 with abdominal pain.  Patient went to UC the week prior for abdominal pain x 2-3 days.  She was given an antibiotic.  She called one day prior to presentation - she was done with abx but the pain was worsening and radiating up in her abdomen.  She was instructed to come to the ER.  The pain was unbearable while at work last night.  No fever.  She started with n/v in the ER after pain medication.  She was having severe pain.  She had been taking MOM because UC suggested she take it, "she told me I was backed up."  It worked and she had BMs after - last took one day prior AM with normal BM last evening about 530.  Hospital Course:   Colitis per CT Confluent inflammatory stranding in the pelvis situated between around the sigmoid colon and secondarily involving the bladder. She failed outpatient treatment. WBC stable at 12. She is afebrile. Lactic acid within limits of normal. Non-toxic appearing. Less abdominal pain this am and tolerating full liquids. Discharged with levsin, flgyl and cipro   Dysuria. Improved this am.   Complained pain with urination.  Urinalysis negative for infection. CT  with inflammatory stranding involving bladder.   Hypertension. Resolved.  No hx of same. Likely related to pain.   Chronic low back pain. Remained stable. Chart review indicates meds include flexaril, neurontin, tramadol, voltaren   Morbid obesity -BMI is 45 -Needs outpatient referral for bariatric medicine and/or surgery  Tobacco dependence -Tobacco Dependence: encourage cessation.  -This was discussed with the patient and should be reviewed on an ongoing basis.  -Patchdeclined Procedures:    Consultations:    Discharge Exam: Vitals:   11/10/18 0546 11/10/18 0548  BP: 140/87 140/87  Pulse: 73 73  Resp: 14 14  Temp: 98.4 F (36.9 C) 98.4 F (36.9 C)  SpO2: 96% 96%    General: awake alert no acute distress Cardiovascular: rrr no mgr no LE edema Respiratory: normal effort BS clear bilaterally no wheeze Abd: obese soft +BS mild diffuse tenderness to to palpation  Discharge Instructions   Discharge Instructions    Call MD for:  severe uncontrolled pain   Complete by: As directed    Call MD for:  temperature >100.4   Complete by: As directed    Diet - low sodium heart healthy   Complete by: As directed    Discharge instructions   Complete by: As directed    Take medications as prescribed Advance diet slowly Eat small frequent meals Follow up with PCP 1-2 weeks for evaluation of symptoms  Increase activity slowly   Complete by: As directed      Allergies as of 11/10/2018   No Known Allergies     Medication List    TAKE these medications   acetaminophen 325 MG tablet Commonly known as: TYLENOL Take 2 tablets (650 mg total) by mouth every 6 (six) hours as needed for mild pain (or Fever >/= 101).   ciprofloxacin 500 MG tablet Commonly known as: Cipro Take 1 tablet (500 mg total) by mouth 2 (two) times daily for 5 days.   hyoscyamine 0.125 MG SL tablet Commonly known as:  LEVSIN SL Place 2 tablets (0.25 mg total) under the tongue 3 (three) times daily before meals.   Ibuprofen-diphenhydrAMINE Cit 200-38 MG Tabs Take 1 tablet by mouth at bedtime as needed (cramps and sleep).   metroNIDAZOLE 500 MG tablet Commonly known as: FLAGYL Take 1 tablet (500 mg total) by mouth 3 (three) times daily for 5 days.   ondansetron 4 MG tablet Commonly known as: ZOFRAN Take 1 tablet (4 mg total) by mouth every 6 (six) hours as needed for nausea.   oxyCODONE-acetaminophen 5-325 MG tablet Commonly known as: PERCOCET/ROXICET Take 1 tablet by mouth every 4 (four) hours as needed for severe pain.      No Known Allergies    The results of significant diagnostics from this hospitalization (including imaging, microbiology, ancillary and laboratory) are listed below for reference.    Significant Diagnostic Studies: Ct Abdomen Pelvis W Contrast  Result Date: 11/08/2018 CLINICAL DATA:  43 year old female with abdominal pain in the mid and lower abdomen. Nausea, not improving with antibiotics. EXAM: CT ABDOMEN AND PELVIS WITH CONTRAST TECHNIQUE: Multidetector CT imaging of the abdomen and pelvis was performed using the standard protocol following bolus administration of intravenous contrast. CONTRAST:  152mL ISOVUE-300 IOPAMIDOL (ISOVUE-300) INJECTION 61% COMPARISON:  Noncontrast CT Abdomen and Pelvis 08/06/2014. FINDINGS: Lower chest: Stable and negative. Hepatobiliary: Negative liver and gallbladder. Pancreas: Negative. Spleen: Negative. Adrenals/Urinary Tract: Normal adrenal glands. Symmetric bilateral renal enhancement and contrast excretion. Decompressed renal collecting systems and proximal ureters. There is secondary inflammation of the dome of the urinary bladder (sagittal image 127) with mild bladder wall thickening. No urinary calculus identified. Stomach/Bowel: There is confluent inflammatory stranding throughout mesentery in the pelvis situated between the sigmoid colon and  urinary bladder (sagittal image 123 and series 3, image 68. This stranding appears more related to the sigmoid colon than regional small bowel, although only mild sigmoid wall thickening is associated. No definite diverticula. The sigmoid is decompressed and mildly redundant. No extraluminal gas or fluid. Decompressed and negative descending colon. Negative transverse colon. Negative right colon and appendix (series 3, image 58). Negative terminal ileum. Although some small bowel loops border the area of mesenteric stranding in the pelvis, most of the small bowel appears unaffected. A few loops may demonstrate mild secondary inflammation (coronal image 77). No dilated small bowel. Decompressed and negative stomach and duodenum. No free air. No abdominal free fluid. Vascular/Lymphatic: Suboptimal intravascular contrast bolus but the major arterial structures appear patent. Portal venous system appears grossly patent. No lymphadenopathy, small retroperitoneal lymph nodes in the abdomen and pelvis are stable. Reproductive: Surgically absent uterus as before. Ovaries appear within normal limits along the superior pelvic inlet. Other: Trace pelvic free fluid with simple fluid density on series 3, image 72. Musculoskeletal: Advanced disc degeneration at the lumbosacral junction with vacuum disc. Mild degenerative appearing retrolisthesis there is stable. No acute osseous abnormality identified. IMPRESSION: 1. Confluent inflammatory stranding  in the pelvis situated between around the sigmoid colon and secondarily involving the bladder fundus. Minimal to mild secondary inflammation of some regional small bowel. Trace free fluid. This most resembles a resolving sigmoid colitis. No other complicating features. No definite diverticula in the region. 2. Negative CT abdomen. 3. 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 Electronically Signed   By: Genevie Ann M.D.   On: 11/08/2018 08:37     Microbiology: Recent Results (from the past 240 hour(s))  SARS Coronavirus 2 (CEPHEID - Performed in [place] hospital lab), Hosp Order     Status: None   Collection Time: 11/08/18 10:40 AM   Specimen: Nasopharyngeal Swab  Result Value Ref Range Status   SARS Coronavirus 2 NEGATIVE NEGATIVE Final    Comment: (NOTE) If result is NEGATIVE SARS-CoV-2 target nucleic acids are NOT DETECTED. The SARS-CoV-2 RNA is generally detectable in upper and lower  respiratory specimens during the acute phase of infection. The lowest  concentration of SARS-CoV-2 viral copies this assay can detect is 250  copies / mL. A negative result does not preclude SARS-CoV-2 infection  and should not be used as the sole basis for treatment or other  patient management decisions.  A negative result may occur with  improper specimen collection / handling, submission of specimen other  than nasopharyngeal swab, presence of viral mutation(s) within the  areas targeted by this assay, and inadequate number of viral copies  (<250 copies / mL). A negative result must be combined with clinical  observations, patient history, and epidemiological information. If result is POSITIVE SARS-CoV-2 target nucleic acids are DETECTED. The SARS-CoV-2 RNA is generally detectable in upper and lower  respiratory specimens dur ing the acute phase of infection.  Positive  results are indicative of active infection with SARS-CoV-2.  Clinical  correlation with patient history and other diagnostic information is  necessary to determine patient infection status.  Positive results do  not rule out bacterial infection or co-infection with other viruses. If result is PRESUMPTIVE POSTIVE SARS-CoV-2 nucleic acids MAY BE PRESENT.   A presumptive positive result was obtained on the submitted specimen  and confirmed on repeat testing.  While 2019 novel coronavirus  (SARS-CoV-2) nucleic acids may be present in the submitted sample   additional confirmatory testing may be necessary for epidemiological  and / or clinical management purposes  to differentiate between  SARS-CoV-2 and other Sarbecovirus currently known to infect humans.  If clinically indicated additional testing with an alternate test  methodology [phone]) is advised. The SARS-CoV-2 RNA is generally  detectable in upper and lower respiratory sp ecimens during the acute  phase of infection. The expected result is Negative. Fact Sheet for Patients:  StrictlyIdeas.no Fact Sheet for Healthcare Providers: BankingDealers.co.za This test is not yet approved or cleared by the [place] FDA and has been authorized for detection and/or diagnosis of SARS-CoV-2 by FDA under an Emergency Use Authorization (EUA).  This EUA will remain in effect (meaning this test can be used) for the duration of the COVID-19 declaration under Section 564(b)(1) of the Act, 21 U.S.C. section 360bbb-3(b)(1), unless the authorization is terminated or revoked sooner. Performed at [facility], [place] [address] [place], [place] [address]      Labs: Basic Metabolic Panel: Recent Labs  Lab 11/08/18 0628 11/09/18 0302 11/10/18 0315  NA 133* 133* 135  K 4.7 4.2 4.3  CL 101 102 102  CO2 23 24 22   GLUCOSE 103* 86 78  BUN  14 12 9   CREATININE 1.15* 0.93 0.94  CALCIUM 8.8* 8.2* 8.1*   Liver Function Tests: Recent Labs  Lab 11/08/18 0628  AST 19  ALT 22  ALKPHOS 86  BILITOT 0.4  PROT 8.2*  ALBUMIN 3.1*   Recent Labs  Lab 11/08/18 0628  LIPASE 27   No results for input(s): AMMONIA in the last 168 hours. CBC: Recent Labs  Lab 11/08/18 0628 11/09/18 0302 11/10/18 0315  WBC 15.6* 12.3* 12.7*  HGB 12.5 10.6* 10.7*  HCT 38.7 33.1* 33.1*  MCV 81.8 81.1 81.5  PLT 441* 353 371   Cardiac Enzymes: No results for input(s): CKTOTAL, CKMB, CKMBINDEX, TROPONINI in the last 168 hours. BNP: BNP (last 3 results) No  results for input(s): BNP in the last 8760 hours.  ProBNP (last 3 results) No results for input(s): PROBNP in the last 8760 hours.  CBG: No results for input(s): GLUCAP in the last 168 hours.     SignedRadene Gunning NP [facility] 11/10/2018, 1:04 PM

## 2018-11-23 ENCOUNTER — Other Ambulatory Visit: Payer: Self-pay | Admitting: Family Medicine

## 2018-11-23 DIAGNOSIS — R140 Abdominal distension (gaseous): Secondary | ICD-10-CM

## 2018-11-23 DIAGNOSIS — R109 Unspecified abdominal pain: Secondary | ICD-10-CM

## 2018-11-24 ENCOUNTER — Ambulatory Visit
Admission: RE | Admit: 2018-11-24 | Discharge: 2018-11-24 | Disposition: A | Discharge status: Home / Self Care | Payer: 59 | Source: Ambulatory Visit | Attending: Family Medicine | Admitting: Family Medicine

## 2018-11-24 DIAGNOSIS — R109 Unspecified abdominal pain: Secondary | ICD-10-CM

## 2018-11-24 DIAGNOSIS — R140 Abdominal distension (gaseous): Secondary | ICD-10-CM

## 2018-11-24 MED ORDER — IOPAMIDOL (ISOVUE-300) INJECTION 61%
100.0000 mL | Freq: Once | INTRAVENOUS | Status: AC | PRN
  Administered 2018-11-24: 100 mL via INTRAVENOUS

## 2018-12-07 ENCOUNTER — Other Ambulatory Visit: Payer: 59

## 2019-01-23 ENCOUNTER — Other Ambulatory Visit: Payer: Self-pay | Admitting: General Surgery

## 2019-01-23 ENCOUNTER — Other Ambulatory Visit (HOSPITAL_COMMUNITY): Payer: Self-pay | Admitting: General Surgery

## 2019-02-16 ENCOUNTER — Other Ambulatory Visit: Payer: Self-pay

## 2019-02-16 ENCOUNTER — Encounter: Payer: Self-pay | Admitting: Dietician

## 2019-02-16 ENCOUNTER — Encounter: Payer: 59 | Attending: General Surgery | Admitting: Dietician

## 2019-02-16 DIAGNOSIS — E669 Obesity, unspecified: Secondary | ICD-10-CM | POA: Insufficient documentation

## 2019-02-16 NOTE — Progress Notes (Signed)
Nutrition Assessment for Bariatric Surgery Medical Nutrition Therapy  Appt Start Time: 8:00am    End Time: 9:00am  Patient was seen on 02/16/2019 for Pre-Operative Nutrition Assessment. Letter of approval faxed to [facility] bariatric surgery program coordinator on 02/16/2019.   Referral stated Supervised Weight Loss (SWL) visits needed: 6 *Pt states she has a letter from her provider stating she has completed these nutrition visits already. Waiting for pt to clear this with [facility].   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 States she is mostly sedentary all day/during her shift. Her godmother and other people she knows have had bariatric surgery before. States many of her family members are in poor health and are very overweight, and she does not want that.  Pt is a current smoker, actively working on quitting. States she also fears quitting because she knows she will eat more and anticipates gaining weight.   States she has hx of colitis (about 3 months ago), went through a liquid diet phase for about 2-3 weeks and found she likes oatmeal and fruit (states this helps her have a BM.) States she likes using the air fryer. Eats a lot of baked chicken, seafood, vegetables, and likes having steak a couple times per week. Drinks a lot of water, 5-6 bottles per day, and coffee (2 cups/day).   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 Been working with a Clinical research associate (on and off in the past) and one of her goals is to be more active again.    24-Hr Dietary Recall First Meal: oatmeal + toast + fruit  Snack: - Second Meal: applesauce + nabs  Snack: - Third Meal: grilled chicken salad  Snack: - Beverages: water, hot tea, coffee  Estimated Energy Needs Calories: 1600 Carbohydrate: 180g Protein: 100g Fat: 53g   NUTRITION DIAGNOSIS  Overweight/obesity ([place]-3.3) related to past poor dietary habits and physical inactivity as evidenced by patient w/ planned Sleeve Gastrectomy surgery following dietary guidelines for continued weight loss.    NUTRITION INTERVENTION  Nutrition counseling (C-1) and education (E-2) to facilitate bariatric surgery goals.  Pre-Op Goals Reviewed with the Patient . Track food and beverage intake (pen and paper, MyFitness Pal, Baritastic app, etc.) . Make healthy food choices while monitoring portion sizes . Consume 3 meals per day or try to eat every 3-5 hours . Avoid concentrated sugars and fried foods . Keep sugar & fat in the single digits per serving on food labels . Practice CHEWING your food (aim for applesauce consistency) . Practice not drinking 15 minutes before, during, and 30 minutes after each meal and snack . Avoid all carbonated beverages (ex: soda, sparkling beverages)  . Limit caffeinated beverages (ex: coffee, tea, energy drinks) . Avoid all sugar-sweetened beverages (ex: regular soda, sports drinks)  . Avoid alcohol  . Aim for 64-100 ounces of FLUID daily (with at least half of fluid intake being plain water)  . Aim for at least 60-80 grams of PROTEIN daily . Look for a liquid  protein source that contains ?15 g protein and ?5 g carbohydrate (ex: shakes, drinks, shots) . Make a list of non-food related activities . Physical activity is an important part of a healthy lifestyle so keep it moving! The goal is to reach 150 minutes of exercise per week, including cardiovascular and weight baring activity.  Handouts Provided Include  . Bariatric Surgery handouts (Nutrition Visits,  Pre-Op Goals, Protein Shakes, Vitamins & Minerals)  Learning Style & Readiness for Change Teaching method utilized: Visual & Auditory  Demonstrated degree of understanding via: Teach Back  Barriers to learning/adherence to lifestyle change: None Identified (*having a hard time quitting smoking)   MONITORING & EVALUATION Dietary intake, weekly physical activity, body weight, and pre-op goals reached at next nutrition visit.   Next Steps Patient is to return to [facility] in 1 month for 1st SWL Visit, unless pt's past nutrition visits with her other provider qualify her SWL requirements.  Patient is to call [facility] to enroll in Pre-Op Class (>2 weeks before surgery) and Post-Op Class (2 weeks after surgery) for further nutrition education when surgery date is scheduled.

## 2019-02-22 ENCOUNTER — Other Ambulatory Visit: Payer: Self-pay | Admitting: Family Medicine

## 2019-02-22 DIAGNOSIS — Z1231 Encounter for screening mammogram for malignant neoplasm of breast: Secondary | ICD-10-CM

## 2019-03-16 ENCOUNTER — Other Ambulatory Visit: Payer: Self-pay

## 2019-03-16 ENCOUNTER — Encounter: Payer: Self-pay | Admitting: Dietician

## 2019-03-16 ENCOUNTER — Encounter: Payer: 59 | Attending: General Surgery | Admitting: Dietician

## 2019-03-16 DIAGNOSIS — E669 Obesity, unspecified: Secondary | ICD-10-CM | POA: Diagnosis not present

## 2019-03-16 DIAGNOSIS — Z6841 Body Mass Index (BMI) 40.0 and over, adult: Secondary | ICD-10-CM

## 2019-03-16 NOTE — Progress Notes (Signed)
Supervised Weight Loss Visit Bariatric Nutrition Education Appt Start Time: 8:10am    End Time: 8:25am  Planned Surgery: Sleeve   Pt Expectation of Surgery/ Goals: to lose weight in order to prevent future health problems, be more active/ have more energy, and live longer to be around with her grandkids  1st out of 6 SWL Appointments  *Pt states she has a letter from her provider stating she has completed these nutrition visits already, waiting to clear this with [facility].   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 States she is mostly sedentary all day/during her shift. Her godmother and other people she knows have had bariatric surgery before. States many of her family members are in poor health and are very overweight, and she does not want that.  Pt is a current smoker, actively working on quitting. States she also fears quitting because she knows she will eat more and anticipates gaining weight. States she has hx of colitis (within past year.)   Pt states she has cut out red meat, eats mostly chicken, fish, and shrimp for animal proteins. May have a bran muffin for breakfast instead of eggs. Cutting back on coffee (from 3 cups to 1 cup per day), drinks at least 5 bottles of water per day.   Estimated daily fluid intake: 85+ oz  24-Hr Dietary Recall First Meal: eggs + toast  Snack: - Second Meal: cheese + crackers  Snack: - Third Meal: broccoli + baked chicken  Snack: - Beverages: water, coffee, Welch's grape juice, flavored water  Estimated Energy Needs Calories: 1600 Carbohydrate: 180g Protein: 100g Fat: 53g   NUTRITION DIAGNOSIS  Overweight/obesity ([place]-3.3) related to past poor dietary habits and physical  inactivity as evidenced by patient w/ planned Sleeve Gastrectomy surgery following dietary guidelines for continued weight loss.   NUTRITION INTERVENTION  Nutrition counseling (C-1) and education (E-2) to facilitate bariatric surgery goals.  Pre-Op Goals Progress & New Goals . Drinks 64+ ounces of fluid daily  . Chews thoroughly, takes small bites  . Cutting back on caffeine   Learning Style & Readiness for Change Teaching method utilized: Visual & Auditory  Demonstrated degree of understanding via: Teach Back  Barriers to learning/adherence to lifestyle change: None Identified (*having a hard time quitting smoking)  RD's Notes for next Visit  . Smoking  . Not drinking with meals    MONITORING & EVALUATION Dietary intake, weekly physical activity, body weight, and pre-op goals in 1 month.   Next Steps  Patient is to return to [facility] in 1 month for 2nd (of 6) SWL visits unless past nutrition visits with her other provider qualify her SWL requirements.

## 2019-04-03 ENCOUNTER — Ambulatory Visit: Payer: 59 | Admitting: Dietician

## 2019-04-20 ENCOUNTER — Encounter: Payer: Self-pay | Admitting: Dietician

## 2019-04-20 ENCOUNTER — Encounter: Payer: 59 | Attending: General Surgery | Admitting: Dietician

## 2019-04-20 ENCOUNTER — Other Ambulatory Visit: Payer: Self-pay

## 2019-04-20 DIAGNOSIS — E669 Obesity, unspecified: Secondary | ICD-10-CM | POA: Insufficient documentation

## 2019-04-20 NOTE — Progress Notes (Signed)
Supervised Weight Loss Visit Bariatric Nutrition Education Appt Start Time: 7:30am    End Time: 7:50am  Planned Surgery: Sleeve   Pt Expectation of Surgery/ Goals: to lose weight in order to prevent future health problems, be more active/ have more energy, and live longer to be around with her grandkids  2nd out of 6 SWL Appointments  *Pt states she has a letter from her provider stating she has completed these nutrition visits already, waiting to clear this with [facility]. I have reached out to [facility] via email multiple times and am waiting for this confirmation as well.    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 Her godmother and other people she knows have had bariatric surgery before. States many of her family members are in poor health and are very overweight, and she does not want that. Pt is a current smoker, actively working on quitting. States she has hx of colitis (within past year.)   Pt states she has cut out red meat, eats mostly chicken, fish, and shrimp for animal proteins. May have a bran muffin or eggs for breakfast. Cutting back on coffee (from 3 cups to 1 cup per day), drinks at least 5 bottles of water per day. Lately has been eating more rice. Drinks juice almost daily.  Estimated daily fluid intake: 85+ oz  24-Hr Dietary Recall First Meal: oatmeal Snack: - Second Meal: fruit cup Snack: - Third Meal: baked chicken + broccoli + rice Snack: - Beverages: water, coffee, Welch's grape juice, flavored water  Estimated Energy Needs Calories: 1600 Carbohydrate: 180g Protein: 100g Fat: 53g   NUTRITION DIAGNOSIS  Overweight/obesity ([place]-3.3) related to past poor dietary habits and physical inactivity as evidenced by patient w/ planned Sleeve Gastrectomy surgery  following dietary guidelines for continued weight loss.   NUTRITION INTERVENTION  Nutrition counseling (C-1) and education (E-2) to facilitate bariatric surgery goals.  Pre-Op Goals Progress & New Goals . Drinks 64+ ounces of fluid daily  . Chews thoroughly, takes small bites  . Cutting back on caffeine  . NEW: Practice not drinking with meals/snacks   Learning Style & Readiness for Change Teaching method utilized: Visual & Auditory  Demonstrated degree of understanding via: Teach Back  Barriers to learning/adherence to lifestyle change: None Identified      MONITORING & EVALUATION Dietary intake, weekly physical activity, body weight, and pre-op goals in 1 month.   Next Steps  Patient is to return to [facility] in 1 month for 3rd (of 6) SWL visits unless past nutrition visits with her other provider qualify her SWL requirements.

## 2019-05-11 ENCOUNTER — Ambulatory Visit
Admission: RE | Admit: 2019-05-11 | Discharge: 2019-05-11 | Disposition: A | Discharge status: Home / Self Care | Payer: 59 | Source: Ambulatory Visit | Attending: Family Medicine | Admitting: Family Medicine

## 2019-05-11 ENCOUNTER — Ambulatory Visit: Payer: 59

## 2019-05-11 ENCOUNTER — Other Ambulatory Visit: Payer: Self-pay

## 2019-05-11 DIAGNOSIS — Z1231 Encounter for screening mammogram for malignant neoplasm of breast: Secondary | ICD-10-CM

## 2019-05-15 ENCOUNTER — Other Ambulatory Visit: Payer: Self-pay | Admitting: Family Medicine

## 2019-05-15 DIAGNOSIS — R928 Other abnormal and inconclusive findings on diagnostic imaging of breast: Secondary | ICD-10-CM

## 2019-05-18 ENCOUNTER — Ambulatory Visit: Payer: 59 | Admitting: Dietician

## 2019-05-21 ENCOUNTER — Encounter: Payer: 59 | Attending: General Surgery | Admitting: Skilled Nursing Facility1

## 2019-05-21 ENCOUNTER — Other Ambulatory Visit: Payer: Self-pay

## 2019-05-21 DIAGNOSIS — E669 Obesity, unspecified: Secondary | ICD-10-CM | POA: Diagnosis present

## 2019-05-21 NOTE — Progress Notes (Signed)
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 Her godmother and other people she knows have had bariatric surgery before. States many of her family members are in poor health and are very overweight, and she does not want that. Pt is a current smoker, actively working on quitting. States she has hx of colitis (within past year.)   Pt states she has cut out red meat, eats mostly chicken, fish, and shrimp for animal proteins. May have a bran muffin or eggs for breakfast. Cutting back on coffee (from 3 cups to 1 cup per day), drinks at least 5 bottles of water per day. Drinks juice almost daily.  Pt states she has a very stressful job.Pt states [facility] tells her she needs to continue to have visits because they do not access to her other records. Pt states she does not like her food to touch. Pt states she typically has one meal a day. Pt states she sleeps about 8-10 hours of sleep. Pt states she works 9pm-7am or 11pm-9am.  There was a miscommunication between dietitian and pt which was rectified: pt thought she was being advised to wake up during her sleep cycle, this was not advised, dietitian advised pt to eat 3 meals during her waking hours which would be in the night and early morning hours.   Estimated daily fluid intake: 85+ oz  24-Hr Dietary Recall First Meal 3-4am: oatmeal or crackers or cream or wheat Snack: fruit cup Second Meal: fruit cup or half  [place] sandwich Snack: crackers Third Meal: baked chicken + broccoli + rice or vegetables soup with crackers Snack: pudding or applesauce  Beverages: water, coffee, Welch's grape juice, flavored water, hot herbal tea, protein drinks  Estimated Energy Needs Calories: 1600 Carbohydrate: 180g Protein: 100g Fat: 53g   NUTRITION DIAGNOSIS  Overweight/obesity ([place]-3.3) related to past poor dietary habits and physical inactivity as evidenced by patient w/ planned Sleeve Gastrectomy surgery following dietary guidelines for continued weight loss.   NUTRITION INTERVENTION  Nutrition counseling (C-1) and education (E-2) to facilitate bariatric surgery goals.  Pre-Op Goals Progress & New Goals . Drinks 64+ ounces of fluid daily  . Chews thoroughly, takes small bites  . Cutting back on caffeine  . Practice not drinking with meals/snacks  . NEW: Be sure to eat 3 meals throughout your waking hours aiming to eat at least every 5 hours  . NEW: try diet juice   Learning Style & Readiness for Change Teaching method utilized: Visual & Auditory  Demonstrated degree of understanding via: Teach Back  Barriers to learning/adherence to lifestyle change: None Identified     MONITORING & EVALUATION Dietary intake, weekly physical activity, body weight, and pre-op goals in 1 month.   Next Steps  Patient is to return to [facility] in 1 month for 4th (of 6) SWL visits unless past nutrition visits with her other provider qualify her SWL requirements.

## 2019-05-25 ENCOUNTER — Ambulatory Visit
Admission: RE | Admit: 2019-05-25 | Discharge: 2019-05-25 | Disposition: A | Discharge status: Home / Self Care | Payer: 59 | Source: Ambulatory Visit | Attending: Family Medicine | Admitting: Family Medicine

## 2019-05-25 ENCOUNTER — Other Ambulatory Visit: Payer: Self-pay

## 2019-05-25 ENCOUNTER — Other Ambulatory Visit: Payer: Self-pay | Admitting: Family Medicine

## 2019-05-25 DIAGNOSIS — R928 Other abnormal and inconclusive findings on diagnostic imaging of breast: Secondary | ICD-10-CM

## 2019-06-01 ENCOUNTER — Other Ambulatory Visit: Payer: 59

## 2019-06-12 ENCOUNTER — Ambulatory Visit: Payer: Self-pay | Admitting: General Surgery

## 2019-06-12 DIAGNOSIS — N6011 Diffuse cystic mastopathy of right breast: Secondary | ICD-10-CM

## 2019-06-18 ENCOUNTER — Other Ambulatory Visit: Payer: Self-pay

## 2019-06-18 ENCOUNTER — Encounter: Payer: 59 | Attending: General Surgery | Admitting: Skilled Nursing Facility1

## 2019-06-18 DIAGNOSIS — E669 Obesity, unspecified: Secondary | ICD-10-CM

## 2019-06-18 NOTE — Progress Notes (Signed)
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  Pt states she is planning breast  surgery to remove fatty lumps and possibly a reduction. Pt states She is frustrated with pre-surgery process. Pt states she has made improvements toward goals (see below). She reports she does not get any physical activity. She notes she manages stress by eatnig fruit cups, gum and/or peppermint candy. She reports that she has been eating smaller portion sizes. She notes she has reduced smoking from 1 pk/d to 1-2 individual cigarettes a day. She reports she may wake up at 2pm to eat a snack, noting that eating (oatmeal) prior to bed helps her sleep. She notes she has uninterrupted sleep.  Pt states she will eat peppermints instead of smoking (dietitian educated pt on these still having sugar in them).   Estimated daily fluid intake: 4-5 bottle oz  24-Hr Dietary Recall First Meal: oatmeal pears Snack: fruit cup Second Meal: yogurt pineapple cup Snack: graham crackers Third Meal: baked chicken, mashed potato, cabbage Snack:  Beverages: coffee once day w/ chocolate protein shake as creamer, sweet tea, 4-5 water bottles  Estimated Energy Needs Calories: 1600 Carbohydrate: 180g Protein: 100g Fat: 53g   NUTRITION DIAGNOSIS  Overweight/obesity ([place]-3.3) related to past poor dietary habits and physical inactivity as evidenced by patient w/ planned Sleeve Gastrectomy surgery following dietary guidelines for continued weight  loss.   NUTRITION INTERVENTION  Nutrition counseling (C-1) and education (E-2) to facilitate bariatric surgery goals.  Pre-Op Goals Progress & New Goals . Improved: Drinks 64+ ounces of fluid daily  . Improved: Chews thoroughly, takes small bites . Improved: Cutting back on caffeine  . Improved: Practice not drinking with meals/snacks . Continue: Be sure to eat 3 meals throughout your waking hours aiming to eat at least every 5 hours : improvement . Continue: try diet juice  . NEW: Reduce smoking . NEW: Stress eating Don't replace food with smoking to deal with stress . NEW: Eat sugar-free candy  Learning Style & Readiness for Change Teaching method utilized: Visual & Auditory  Demonstrated degree of understanding via: Teach Back  Barriers to learning/adherence to lifestyle change: None Identified     MONITORING & EVALUATION Dietary intake, weekly physical activity, body weight, and pre-op goals in 1 month.   Next Steps  Patient is to return to [facility] in 1 month for 5th (of 6) SWL visits unless past nutrition visits with her other provider qualify her SWL requirements.

## 2019-06-25 ENCOUNTER — Ambulatory Visit: Payer: 59 | Admitting: Skilled Nursing Facility1

## 2019-07-13 ENCOUNTER — Encounter: Payer: Self-pay | Admitting: Dietician

## 2019-07-13 ENCOUNTER — Other Ambulatory Visit (HOSPITAL_COMMUNITY): Payer: Self-pay | Admitting: General Surgery

## 2019-07-13 ENCOUNTER — Other Ambulatory Visit: Payer: Self-pay

## 2019-07-13 ENCOUNTER — Encounter: Payer: 59 | Attending: General Surgery | Admitting: Dietician

## 2019-07-13 DIAGNOSIS — E669 Obesity, unspecified: Secondary | ICD-10-CM | POA: Diagnosis present

## 2019-07-13 NOTE — Progress Notes (Signed)
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 Pt states she is frustrated with pre-surgery process. Pt states she has made improvements toward goals. Started walking with her friend M/T/W after work.   Estimated daily fluid intake: 4-5 bottle oz  24-Hr Dietary Recall First Meal: fruit cup + oatmeal + sliced apples Snack: sliced apples  Second Meal: grilled cheese  Snack: graham crackers Third Meal: sauteed shrimp + green peppers/onions  Snack: - Beverages: coffee once day w/ chocolate protein shake as creamer, sweet tea, 4-5 water bottles, apple juice   Estimated Energy Needs Calories: 1600 Carbohydrate: 180g Protein: 100g Fat: 53g   NUTRITION DIAGNOSIS  Overweight/obesity ([place]-3.3) related to past poor dietary habits and physical inactivity as evidenced by patient w/ planned Sleeve Gastrectomy surgery following dietary guidelines for continued weight loss.   NUTRITION INTERVENTION  Nutrition counseling (C-1) and education (E-2) to facilitate bariatric surgery goals.  Pre-Op Goals Progress & New Goals . Improved: Drinks 64+ ounces of fluid daily  . Improved: Chews thoroughly, takes small bites . Improved: Cutting back on caffeine  . Improved: Practice not drinking with meals/snacks . Continue: Be sure to eat 3 meals throughout your waking hours aiming to eat at least every 5 hours . Continue: try diet juice  . Reducing smoking  Learning Style & Readiness for Change Teaching method utilized: Visual & Auditory  Demonstrated degree of  understanding via: Teach Back  Barriers to learning/adherence to lifestyle change: None Identified     MONITORING & EVALUATION Dietary intake, weekly physical activity, body weight, and pre-op goals in 1 month.   Next Steps  Patient is to return to [facility] for Pre-Op Class >2 weeks before surgery. Per new insurance requirements, patient does not need to finish completing the 6 SWL visits.

## 2019-07-25 ENCOUNTER — Ambulatory Visit (HOSPITAL_COMMUNITY)
Admission: RE | Admit: 2019-07-25 | Discharge: 2019-07-25 | Disposition: A | Discharge status: Home / Self Care | Payer: 59 | Source: Ambulatory Visit | Attending: General Surgery | Admitting: General Surgery

## 2019-07-25 ENCOUNTER — Other Ambulatory Visit: Payer: Self-pay

## 2019-08-10 ENCOUNTER — Ambulatory Visit: Payer: 59 | Admitting: Dietician

## 2019-09-07 ENCOUNTER — Ambulatory Visit (INDEPENDENT_AMBULATORY_CARE_PROVIDER_SITE_OTHER): Payer: 59 | Admitting: Psychology

## 2019-09-10 ENCOUNTER — Encounter: Payer: Self-pay | Admitting: Nurse Practitioner

## 2019-09-12 ENCOUNTER — Ambulatory Visit (INDEPENDENT_AMBULATORY_CARE_PROVIDER_SITE_OTHER): Payer: 59 | Admitting: Psychology

## 2019-09-12 DIAGNOSIS — F509 Eating disorder, unspecified: Secondary | ICD-10-CM

## 2019-09-17 ENCOUNTER — Encounter: Payer: 59 | Attending: General Surgery | Admitting: Skilled Nursing Facility1

## 2019-09-17 ENCOUNTER — Other Ambulatory Visit: Payer: Self-pay

## 2019-09-17 DIAGNOSIS — E669 Obesity, unspecified: Secondary | ICD-10-CM | POA: Diagnosis present

## 2019-09-17 NOTE — Progress Notes (Signed)
Pre-Operative Nutrition Class:  Appt start time: 6160   End time:  1830.  Patient was seen on 09/17/2019 for Pre-Operative Bariatric Surgery [facility]    Surgery date:  Surgery type: sleeve Start weight at [facility]: 245.5 Weight today: 246  Samples given per MNT protocol. Patient educated on appropriate usage: Celebrate Multivitamin Lot # [id number] Exp: 08/2020  celebrate Calcium  Lot # [id number] Exp: 12/2020  premier Protein   Shake Lot # [id number] Exp: 12/30/19  The following the learning objectives were met by the patient during this course:  Identify Pre-Op Dietary Goals and will begin 2 weeks pre-operatively  Identify appropriate sources of fluids and proteins   State protein recommendations and appropriate sources pre and post-operatively  Identify Post-Operative Dietary Goals and will follow for 2 weeks post-operatively  Identify appropriate multivitamin and calcium sources  Describe the need for physical activity post-operatively and will follow MD recommendations  State when to call healthcare provider regarding medication questions or post-operative complications  Handouts given during class include:  Pre-Op Bariatric Surgery Diet Handout  Protein Shake Handout  Post-Op Bariatric Surgery Nutrition Handout  BELT Program Information Flyer  Support Group Information Flyer  [facility] Bariatric Supplements Price List  Follow-Up Plan: Patient will follow-up at [facility] 2 weeks post operatively for diet advancement per MD.

## 2019-09-21 ENCOUNTER — Ambulatory Visit: Payer: 59 | Admitting: Psychology

## 2019-10-24 ENCOUNTER — Ambulatory Visit: Payer: Self-pay | Admitting: General Surgery

## 2019-11-19 ENCOUNTER — Telehealth: Payer: Self-pay | Admitting: Skilled Nursing Facility1

## 2019-11-19 NOTE — Telephone Encounter (Signed)
Pt called asking if she needed to follow a liquid diet for 2 weeks before surgery.   Dietitian advsied no follow the pre-op diet you learned from pre-op class pt verbalized understanding.

## 2019-11-28 ENCOUNTER — Other Ambulatory Visit: Payer: Self-pay

## 2019-11-28 NOTE — Patient Instructions (Addendum)
DUE TO COVID-19 ONLY ONE VISITOR IS ALLOWED TO COME WITH YOU AND STAY IN THE WAITING ROOM ONLY DURING PRE OP AND PROCEDURE DAY OF SURGERY. THE 2 VISITORS  MAY VISIT WITH YOU AFTER SURGERY IN YOUR PRIVATE ROOM DURING VISITING HOURS ONLY!  YOU NEED TO HAVE A COVID 19 TEST ON____7/30___ @_8 :00______, THIS TEST MUST BE DONE BEFORE SURGERY, COME  [address], [place] , [address].  ([place]) ,  ONCE YOUR COVID TEST IS COMPLETED,  PLEASE BEGIN THE QUARANTINE INSTRUCTIONS AS OUTLINED IN YOUR HANDOUT.                Madison [name]    Your procedure is scheduled on: 12/04/19   Report to [facility] Main  Entrance   Report to  Short Stay at 5:30 AM     Call this number if you have problems the morning of surgery [place], NO CHEWING GUM [place].    NO SOLID FOOD AFTER 6:00 PM THE NIGHT BEFORE YOUR SURGERY. YOU MAY DRINK CLEAR FLUIDS until 4:30 AM    CLEAR LIQUID DIET   Foods Allowed                                                                     Foods Excluded  Coffee and tea, regular and decaf                             liquids that you cannot  Plain Jell-O any favor except red or purple                                           see through such as: Fruit ices (not with fruit pulp)                                     milk, soups, orange juice  Iced Popsicles                                    All solid food Carbonated beverages, regular and diet                                    Cranberry, grape and apple juices Sports drinks like Gatorade Lightly seasoned clear broth or consume(fat free) Sugar,  At 4:30 AM drink the G2 gatoraid then nothing more by mouth    PAIN IS EXPECTED AFTER SURGERY AND WILL NOT BE COMPLETELY ELIMINATED  . AMBULATION AND TYLENOL WILL HELP REDUCE INCISIONAL AND GAS PAIN. MOVEMENT IS KEY!  YOU ARE EXPECTED TO BE OUT OF BED WITHIN 4 HOURS  OF ADMISSION TO YOUR PATIENT ROOM.  SITTING IN THE RECLINER THROUGHOUT THE DAY IS IMPORTANT FOR DRINKING FLUIDS AND MOVING GAS THROUGHOUT THE GI TRACT.  COMPRESSION STOCKINGS SHOULD BE WORN [place] UNLESS YOU ARE WALKING.   INCENTIVE SPIROMETER SHOULD BE USED EVERY HOUR WHILE AWAKE TO DECREASE POST-OPERATIVE COMPLICATIONS SUCH AS PNEUMONIA.  WHEN DISCHARGED HOME, IT IS IMPORTANT TO CONTINUE TO WALK EVERY HOUR AND USE THE INCENTIVE SPIROMETER EVERY HOUR.    Take these medicines the morning of surgery with A SIP OF WATER: None                                 You may not have any metal on your body including hair pins and              piercings  Do not wear jewelry, make-up, lotions, powders or perfumes, deodorant             Do not wear nail polish on your fingernails.  Do not shave  48 hours prior to surgery.           Do not bring valuables to the hospital. [place].  Contacts, dentures or bridgework may not be worn into surgery.                   Please read over the following fact sheets you were given: _____________________________________________________________________             [facility] - Preparing for Surgery Before surgery, you can play an important role .  Because skin is not sterile, your skin needs to be as free of germs as possible.   You can reduce the number of germs on your skin by washing with CHG (chlorahexidine gluconate) soap before surgery.   CHG is an antiseptic cleaner which kills germs and bonds with the skin to continue killing germs even after washing. Please DO NOT use if you have an allergy to CHG or antibacterial soaps.   If your skin becomes reddened/irritated stop using the CHG and inform your nurse when you arrive at Short Stay. Do not shave (including legs and underarms) for at least 48 hours prior to the first CHG shower.    Please follow these instructions  carefully:  1.  Shower with CHG Soap the night before surgery and the  morning of Surgery.  2.  If you choose to wash your hair, wash your hair first as usual with your  normal  shampoo.  3.  After you shampoo, rinse your hair and body thoroughly to remove the  shampoo.                                        4.  Use CHG as you would any other liquid soap.  You can apply chg directly  to the skin and wash                       Gently with a scrungie or clean washcloth.  5.  Apply the CHG Soap to your body ONLY FROM THE NECK DOWN.   Do not use on face/ open                           Wound or open sores. Avoid contact with eyes, ears mouth  and genitals (private parts).                       Wash face,  Genitals (private parts) with your normal soap.             6.  Wash thoroughly, paying special attention to the area where your surgery  will be performed.  7.  Thoroughly rinse your body with warm water from the neck down.  8.  DO NOT shower/wash with your normal soap after using and rinsing off  the CHG Soap.             9.  Pat yourself dry with a clean towel.            10.  Wear clean pajamas.            11.  Place clean sheets on your bed the night of your first shower and do not  sleep with pets. Day of Surgery : Do not apply any lotions/deodorants the morning of surgery.  Please wear clean clothes to the hospital/surgery center.  FAILURE TO FOLLOW THESE INSTRUCTIONS MAY RESULT IN THE CANCELLATION OF YOUR SURGERY PATIENT SIGNATURE_________________________________  NURSE SIGNATURE__________________________________  ________________________________________________________________________   Madison [name]  An incentive spirometer is a tool that can help keep your lungs clear and active. This tool measures how well you are filling your lungs with each breath. Taking long deep breaths may help reverse or decrease the chance of developing breathing (pulmonary) problems (especially  infection) following:  A long period of time when you are unable to move or be active. BEFORE THE PROCEDURE   If the spirometer includes an indicator to show your best effort, your nurse or respiratory therapist will set it to a desired goal.  If possible, sit up straight or lean slightly forward. Try not to slouch.  Hold the incentive spirometer in an upright position. INSTRUCTIONS FOR USE  1. Sit on the edge of your bed if possible, or sit up as far as you can in bed or on a chair. 2. Hold the incentive spirometer in an upright position. 3. Breathe out normally. 4. Place the mouthpiece in your mouth and seal your lips tightly around it. 5. Breathe in slowly and as deeply as possible, raising the piston or the ball toward the top of the column. 6. Hold your breath for 3-5 seconds or for as long as possible. Allow the piston or ball to fall to the bottom of the column. 7. Remove the mouthpiece from your mouth and breathe out normally. 8. Rest for a few seconds and repeat Steps 1 through 7 at least 10 times every 1-2 hours when you are awake. Take your time and take a few normal breaths between deep breaths. 9. The spirometer may include an indicator to show your best effort. Use the indicator as a goal to work toward during each repetition. 10. After each set of 10 deep breaths, practice coughing to be sure your lungs are clear. If you have an incision (the cut made at the time of surgery), support your incision when coughing by placing a pillow or rolled up towels firmly against it. Once you are able to get out of bed, walk around indoors and cough well. You may stop using the incentive spirometer when instructed by your caregiver.  RISKS AND COMPLICATIONS  Take your time so you do not get dizzy or light-headed.  If you are in pain, you may need to  take or ask for pain medication before doing incentive spirometry. It is harder to take a deep breath if you are having pain. AFTER  USE  Rest and breathe slowly and easily.  It can be helpful to keep track of a log of your progress. Your caregiver can provide you with a simple table to help with this. If you are using the spirometer at home, follow these instructions: [place] IF:   You are having difficultly using the spirometer.  You have trouble using the spirometer as often as instructed.  Your pain medication is not giving enough relief while using the spirometer.  You develop fever of 100.5 F (38.1 C) or higher. SEEK IMMEDIATE MEDICAL CARE IF:   You cough up bloody sputum that had not been present before.  You develop fever of 102 F (38.9 C) or greater.  You develop worsening pain at or near the incision site. MAKE SURE YOU:   Understand these instructions.  Will watch your condition.  Will get help right away if you are not doing well or get worse. Document Released: 08/30/2006 Document Revised: 07/12/2011 Document Reviewed: 10/31/2006 [facility] Patient Information 2014 [place], [place].   ________________________________________________________________________

## 2019-11-29 ENCOUNTER — Encounter (HOSPITAL_COMMUNITY): Payer: Self-pay

## 2019-11-29 ENCOUNTER — Encounter (HOSPITAL_COMMUNITY)
Admission: RE | Admit: 2019-11-29 | Discharge: 2019-11-29 | Disposition: A | Discharge status: Home / Self Care | Payer: 59 | Source: Ambulatory Visit | Attending: General Surgery | Admitting: General Surgery

## 2019-11-29 ENCOUNTER — Other Ambulatory Visit: Payer: Self-pay

## 2019-11-29 DIAGNOSIS — Z01812 Encounter for preprocedural laboratory examination: Secondary | ICD-10-CM | POA: Diagnosis present

## 2019-11-29 HISTORY — DX: Gastro-esophageal reflux disease without esophagitis: K21.9

## 2019-11-29 LAB — CBC WITH DIFFERENTIAL/PLATELET
Abs Immature Granulocytes: 0.06 10*3/uL (ref 0.00–0.07)
Basophils Absolute: 0 10*3/uL (ref 0.0–0.1)
Basophils Relative: 0 %
Eosinophils Absolute: 0.2 10*3/uL (ref 0.0–0.5)
Eosinophils Relative: 1 %
HCT: 37 % (ref 36.0–46.0)
Hemoglobin: 11.8 g/dL — ABNORMAL LOW (ref 12.0–15.0)
Immature Granulocytes: 0 %
Lymphocytes Relative: 32 %
Lymphs Abs: 4.7 10*3/uL — ABNORMAL HIGH (ref 0.7–4.0)
MCH: 26 pg (ref 26.0–34.0)
MCHC: 31.9 g/dL (ref 30.0–36.0)
MCV: 81.7 fL (ref 80.0–100.0)
Monocytes Absolute: 0.9 10*3/uL (ref 0.1–1.0)
Monocytes Relative: 6 %
Neutro Abs: 9.1 10*3/uL — ABNORMAL HIGH (ref 1.7–7.7)
Neutrophils Relative %: 61 %
Platelets: 398 10*3/uL (ref 150–400)
RBC: 4.53 MIL/uL (ref 3.87–5.11)
RDW: 14.8 % (ref 11.5–15.5)
WBC: 14.9 10*3/uL — ABNORMAL HIGH (ref 4.0–10.5)
nRBC: 0 % (ref 0.0–0.2)

## 2019-11-29 LAB — COMPREHENSIVE METABOLIC PANEL
ALT: 21 U/L (ref 0–44)
AST: 16 U/L (ref 15–41)
Albumin: 3.2 g/dL — ABNORMAL LOW (ref 3.5–5.0)
Alkaline Phosphatase: 79 U/L (ref 38–126)
Anion gap: 7 (ref 5–15)
BUN: 13 mg/dL (ref 6–20)
CO2: 27 mmol/L (ref 22–32)
Calcium: 8.7 mg/dL — ABNORMAL LOW (ref 8.9–10.3)
Chloride: 101 mmol/L (ref 98–111)
Creatinine, Ser: 0.71 mg/dL (ref 0.44–1.00)
GFR calc Af Amer: >60 mL/min (ref >60)
GFR calc non Af Amer: >60 mL/min (ref >60)
Glucose, Bld: 89 mg/dL (ref 70–99)
Potassium: 4.1 mmol/L (ref 3.5–5.1)
Sodium: 135 mmol/L (ref 135–145)
Total Bilirubin: 0.5 mg/dL (ref 0.3–1.2)
Total Protein: 7.9 g/dL (ref 6.5–8.1)

## 2019-11-29 NOTE — Progress Notes (Signed)
COVID Vaccine Completed:No Date COVID Vaccine completed: COVID vaccine manufacturer: Nelson   PCP - Dr. London Pepper Cardiologist -   Chest x-ray - 07/25/19 EKG - 3.24.21 Stress Test - no ECHO - no Cardiac Cath - no  Sleep Study - no CPAP -   Fasting Blood Sugar - NA Checks Blood Sugar _____ times a day  Blood Thinner Instructions:NA Aspirin Instructions: Last Dose:  Anesthesia review:   Patient denies shortness of breath, fever, cough and chest pain at PAT appointment  yes   Patient verbalized understanding of instructions that were given to them at the PAT appointment. Patient was also instructed that they will need to review over the PAT instructions again at home before surgery. Yes  Pt doesn't workout and she is SOB with one flight of stairs. She does not get SOB with ADLs. Her BP was elevated at the PAT appointment she said that she just came off a double night shift.  I told her to call her PCP after she gets some sleep.

## 2019-11-30 ENCOUNTER — Other Ambulatory Visit (HOSPITAL_COMMUNITY)
Admission: RE | Admit: 2019-11-30 | Discharge: 2019-11-30 | Disposition: A | Discharge status: Home / Self Care | Payer: 59 | Source: Ambulatory Visit | Attending: General Surgery | Admitting: General Surgery

## 2019-11-30 DIAGNOSIS — Z01812 Encounter for preprocedural laboratory examination: Secondary | ICD-10-CM | POA: Insufficient documentation

## 2019-11-30 DIAGNOSIS — Z20822 Contact with and (suspected) exposure to covid-19: Secondary | ICD-10-CM | POA: Insufficient documentation

## 2019-11-30 LAB — SARS CORONAVIRUS 2 (TAT 6-24 HRS): SARS Coronavirus 2: NEGATIVE

## 2019-11-30 NOTE — Progress Notes (Signed)
Anesthesia Chart Review   Case: [id number] Date/Time: 12/04/19 0715   Procedures:      LAPAROSCOPIC ROUX-EN-Y GASTRIC BYPASS WITH UPPER ENDOSCOPY (N/A )     UPPER GASTROINTESTINAL ENDOSCOPY (N/A )   Anesthesia type: General   Pre-op diagnosis: MORBID OBESITY   Location: [place] 01 / [facility]   Surgeons: Kinsinger, Arta Bruce, MD      DISCUSSION:44 y.o. former smoker (22 pack years, quit 08/31/19) with h/o GERD, HTN, morbid obesity scheduled for above procedure 12/04/2019 with Dr. [name].   Elevated BP at PAT visit, 173/106.  He was seen by PCP 11/29/2019 for evaluation. Started on Amlodipine 5mg  and Furosemide 40mg .  Pt has recheck of BP 12/03/2019.  Will evaluate BP DOS.    VS: BP (!) 173/106   Pulse 80   Temp 36.9 C (Oral)   Resp 18   Ht 5\' 1"  (1.549 m)   Wt (!) 112.7 kg   LMP 01/17/2012   SpO2 100%   BMI 46.94 kg/m   PROVIDERS: London Pepper, MD is PCP   LABS: Labs reviewed: Acceptable for surgery. (all labs ordered are listed, but only abnormal results are displayed)  Labs Reviewed  CBC WITH DIFFERENTIAL/PLATELET - Abnormal; Notable for the following components:      Result Value   WBC 14.9 (*)    Hemoglobin 11.8 (*)    Neutro Abs 9.1 (*)    Lymphs Abs 4.7 (*)    All other components within normal limits  COMPREHENSIVE METABOLIC PANEL - Abnormal; Notable for the following components:   Calcium 8.7 (*)    Albumin 3.2 (*)    All other components within normal limits  TYPE AND SCREEN     IMAGES:   EKG: 07/25/2019 Rate 84 bpm Normal sinus rhythm Possible Left atrial enlargement Borderline ECG No significant change since last tracing  CV:  Past Medical History:  Diagnosis Date  . Colitis 07/2018  . GERD (gastroesophageal reflux disease)   . Uterine fibroid     Past Surgical History:  Procedure Laterality Date  . ABDOMINAL HYSTERECTOMY  2013    MEDICATIONS: . betamethasone dipropionate 0.05 % lotion  . Multiple Vitamin (MULTIVITAMIN WITH  MINERALS) TABS tablet   No current facility-administered medications for this encounter.    Konrad Felix, PA-C [facility] Pre-Surgical Testing [phone]

## 2019-12-03 MED ORDER — BUPIVACAINE LIPOSOME 1.3 % IJ SUSP
20.0000 mL | Freq: Once | INTRAMUSCULAR | Status: DC
  Filled 2019-12-03: qty 20

## 2019-12-04 ENCOUNTER — Inpatient Hospital Stay (HOSPITAL_COMMUNITY): Payer: 59 | Admitting: Physician Assistant

## 2019-12-04 ENCOUNTER — Encounter (HOSPITAL_COMMUNITY): Payer: Self-pay | Admitting: General Surgery

## 2019-12-04 ENCOUNTER — Encounter (HOSPITAL_COMMUNITY)
Admission: RE | Disposition: A | Discharge status: Home / Self Care | Payer: Self-pay | Source: Ambulatory Visit | Attending: General Surgery

## 2019-12-04 ENCOUNTER — Inpatient Hospital Stay (HOSPITAL_COMMUNITY)
Admission: RE | Admit: 2019-12-04 | Discharge: 2019-12-06 | DRG: 621 | Disposition: A | Discharge status: Home / Self Care | Payer: 59 | Source: Ambulatory Visit | Attending: General Surgery | Admitting: General Surgery

## 2019-12-04 ENCOUNTER — Other Ambulatory Visit: Payer: Self-pay

## 2019-12-04 ENCOUNTER — Inpatient Hospital Stay (HOSPITAL_COMMUNITY): Payer: 59 | Admitting: Certified Registered"

## 2019-12-04 DIAGNOSIS — Z90710 Acquired absence of both cervix and uterus: Secondary | ICD-10-CM | POA: Diagnosis not present

## 2019-12-04 DIAGNOSIS — Z87891 Personal history of nicotine dependence: Secondary | ICD-10-CM

## 2019-12-04 DIAGNOSIS — Z20822 Contact with and (suspected) exposure to covid-19: Secondary | ICD-10-CM | POA: Diagnosis present

## 2019-12-04 DIAGNOSIS — I10 Essential (primary) hypertension: Secondary | ICD-10-CM | POA: Diagnosis present

## 2019-12-04 DIAGNOSIS — K219 Gastro-esophageal reflux disease without esophagitis: Secondary | ICD-10-CM | POA: Diagnosis present

## 2019-12-04 DIAGNOSIS — Z6841 Body Mass Index (BMI) 40.0 and over, adult: Secondary | ICD-10-CM

## 2019-12-04 HISTORY — PX: GASTRIC ROUX-EN-Y: SHX5262

## 2019-12-04 HISTORY — PX: ESOPHAGOGASTRODUODENOSCOPY: SHX5428

## 2019-12-04 LAB — TYPE AND SCREEN
ABO/RH(D): B POS
Antibody Screen: NEGATIVE

## 2019-12-04 LAB — HEMOGLOBIN AND HEMATOCRIT, BLOOD
HCT: 39.8 % (ref 36.0–46.0)
Hemoglobin: 12.2 g/dL (ref 12.0–15.0)

## 2019-12-04 SURGERY — LAPAROSCOPIC ROUX-EN-Y GASTRIC BYPASS WITH UPPER ENDOSCOPY
Anesthesia: General | Site: Esophagus

## 2019-12-04 MED ORDER — CHLORHEXIDINE GLUCONATE CLOTH 2 % EX PADS: 6.0000 | MEDICATED_PAD | Freq: Once | CUTANEOUS | Status: DC

## 2019-12-04 MED ORDER — SODIUM CHLORIDE 0.9 % IV SOLN
2.0000 g | INTRAVENOUS | Status: AC
  Administered 2019-12-04: 2 g via INTRAVENOUS
  Filled 2019-12-04: qty 2

## 2019-12-04 MED ORDER — DEXAMETHASONE SODIUM PHOSPHATE 10 MG/ML IJ SOLN
INTRAMUSCULAR | Status: DC | PRN
  Administered 2019-12-04: 4 mg via INTRAVENOUS

## 2019-12-04 MED ORDER — DEXTROSE-NACL 5-0.45 % IV SOLN: INTRAVENOUS | Status: DC

## 2019-12-04 MED ORDER — LIDOCAINE 2% (20 MG/ML) 5 ML SYRINGE
INTRAMUSCULAR | Status: AC
  Filled 2019-12-04: qty 5

## 2019-12-04 MED ORDER — DEXAMETHASONE SODIUM PHOSPHATE 4 MG/ML IJ SOLN: 4.0000 mg | INTRAMUSCULAR | Status: DC

## 2019-12-04 MED ORDER — MIDAZOLAM HCL 2 MG/2ML IJ SOLN
INTRAMUSCULAR | Status: DC | PRN
  Administered 2019-12-04: 2 mg via INTRAVENOUS

## 2019-12-04 MED ORDER — HEPARIN SODIUM (PORCINE) 5000 UNIT/ML IJ SOLN
5000.0000 [IU] | INTRAMUSCULAR | Status: AC
  Administered 2019-12-04: 5000 [IU] via SUBCUTANEOUS
  Filled 2019-12-04: qty 1

## 2019-12-04 MED ORDER — KETAMINE HCL 10 MG/ML IJ SOLN
INTRAMUSCULAR | Status: AC
  Filled 2019-12-04: qty 1

## 2019-12-04 MED ORDER — HYDRALAZINE HCL 20 MG/ML IJ SOLN
10.0000 mg | Freq: Once | INTRAMUSCULAR | Status: AC
  Administered 2019-12-04: 10 mg via INTRAVENOUS

## 2019-12-04 MED ORDER — FAMOTIDINE IN NACL 20-0.9 MG/50ML-% IV SOLN
20.0000 mg | Freq: Two times a day (BID) | INTRAVENOUS | Status: DC
  Administered 2019-12-04 – 2019-12-05 (×4): 20 mg via INTRAVENOUS
  Filled 2019-12-04 (×4): qty 50

## 2019-12-04 MED ORDER — ENOXAPARIN SODIUM 30 MG/0.3ML ~~LOC~~ SOLN
30.0000 mg | Freq: Two times a day (BID) | SUBCUTANEOUS | Status: DC
  Administered 2019-12-04 – 2019-12-06 (×4): 30 mg via SUBCUTANEOUS
  Filled 2019-12-04 (×4): qty 0.3

## 2019-12-04 MED ORDER — FENTANYL CITRATE (PF) 100 MCG/2ML IJ SOLN
INTRAMUSCULAR | Status: AC
  Filled 2019-12-04: qty 2

## 2019-12-04 MED ORDER — ORAL CARE MOUTH RINSE: 15.0000 mL | Freq: Once | OROMUCOSAL | Status: AC

## 2019-12-04 MED ORDER — ROCURONIUM BROMIDE 10 MG/ML (PF) SYRINGE
PREFILLED_SYRINGE | INTRAVENOUS | Status: AC
  Filled 2019-12-04: qty 10

## 2019-12-04 MED ORDER — BUPIVACAINE HCL 0.25 % IJ SOLN
INTRAMUSCULAR | Status: DC | PRN
  Administered 2019-12-04: 30 mL

## 2019-12-04 MED ORDER — ROCURONIUM BROMIDE 10 MG/ML (PF) SYRINGE
PREFILLED_SYRINGE | INTRAVENOUS | Status: DC | PRN
  Administered 2019-12-04 (×3): 10 mg via INTRAVENOUS
  Administered 2019-12-04: 70 mg via INTRAVENOUS

## 2019-12-04 MED ORDER — SCOPOLAMINE 1 MG/3DAYS TD PT72
1.0000 | MEDICATED_PATCH | TRANSDERMAL | Status: DC
  Administered 2019-12-04: 1.5 mg via TRANSDERMAL
  Filled 2019-12-04: qty 1

## 2019-12-04 MED ORDER — KETAMINE HCL 10 MG/ML IJ SOLN
INTRAMUSCULAR | Status: DC | PRN
Start: 2019-12-04 — End: 2019-12-04
  Administered 2019-12-04: 10 mg via INTRAVENOUS
  Administered 2019-12-04: 15 mg via INTRAVENOUS

## 2019-12-04 MED ORDER — 0.9 % SODIUM CHLORIDE (POUR BTL) OPTIME
TOPICAL | Status: DC | PRN
  Administered 2019-12-04: 1000 mL

## 2019-12-04 MED ORDER — GABAPENTIN 300 MG PO CAPS
300.0000 mg | ORAL_CAPSULE | ORAL | Status: AC
  Administered 2019-12-04: 300 mg via ORAL
  Filled 2019-12-04: qty 1

## 2019-12-04 MED ORDER — BUPIVACAINE HCL 0.25 % IJ SOLN
INTRAMUSCULAR | Status: AC
  Filled 2019-12-04: qty 1

## 2019-12-04 MED ORDER — HYDROMORPHONE HCL 1 MG/ML IJ SOLN
0.2500 mg | INTRAMUSCULAR | Status: DC | PRN
  Administered 2019-12-04 (×2): 0.5 mg via INTRAVENOUS

## 2019-12-04 MED ORDER — ONDANSETRON HCL 4 MG/2ML IJ SOLN
4.0000 mg | INTRAMUSCULAR | Status: DC | PRN
  Administered 2019-12-04 – 2019-12-05 (×2): 4 mg via INTRAVENOUS
  Filled 2019-12-04 (×2): qty 2

## 2019-12-04 MED ORDER — APREPITANT 40 MG PO CAPS
40.0000 mg | ORAL_CAPSULE | ORAL | Status: AC
  Administered 2019-12-04: 40 mg via ORAL
  Filled 2019-12-04: qty 1

## 2019-12-04 MED ORDER — LIDOCAINE 2% (20 MG/ML) 5 ML SYRINGE
INTRAMUSCULAR | Status: DC | PRN
  Administered 2019-12-04: 100 mg via INTRAVENOUS

## 2019-12-04 MED ORDER — PROPOFOL 10 MG/ML IV BOLUS
INTRAVENOUS | Status: DC | PRN
  Administered 2019-12-04: 200 mg via INTRAVENOUS

## 2019-12-04 MED ORDER — LACTATED RINGERS IV SOLN: INTRAVENOUS | Status: DC

## 2019-12-04 MED ORDER — MORPHINE SULFATE (PF) 2 MG/ML IV SOLN
1.0000 mg | INTRAVENOUS | Status: DC | PRN
  Administered 2019-12-04 – 2019-12-05 (×6): 2 mg via INTRAVENOUS
  Filled 2019-12-04 (×6): qty 1

## 2019-12-04 MED ORDER — SUGAMMADEX SODIUM 200 MG/2ML IV SOLN
INTRAVENOUS | Status: DC | PRN
  Administered 2019-12-04: 250 mg via INTRAVENOUS

## 2019-12-04 MED ORDER — MIDAZOLAM HCL 2 MG/2ML IJ SOLN
INTRAMUSCULAR | Status: AC
  Filled 2019-12-04: qty 2

## 2019-12-04 MED ORDER — HYDRALAZINE HCL 20 MG/ML IJ SOLN
INTRAMUSCULAR | Status: AC
  Filled 2019-12-04: qty 1

## 2019-12-04 MED ORDER — EPHEDRINE SULFATE-NACL 50-0.9 MG/10ML-% IV SOSY
PREFILLED_SYRINGE | INTRAVENOUS | Status: DC | PRN
  Administered 2019-12-04 (×5): 5 mg via INTRAVENOUS

## 2019-12-04 MED ORDER — PROPOFOL 10 MG/ML IV BOLUS
INTRAVENOUS | Status: AC
  Filled 2019-12-04: qty 40

## 2019-12-04 MED ORDER — LIDOCAINE 20MG/ML (2%) 15 ML SYRINGE OPTIME
INTRAMUSCULAR | Status: DC | PRN
Start: 2019-12-04 — End: 2019-12-04
  Administered 2019-12-04: 1.5 mg/kg/h via INTRAVENOUS

## 2019-12-04 MED ORDER — ACETAMINOPHEN 500 MG PO TABS
1000.0000 mg | ORAL_TABLET | ORAL | Status: AC
  Administered 2019-12-04: 1000 mg via ORAL
  Filled 2019-12-04: qty 2

## 2019-12-04 MED ORDER — ONDANSETRON HCL 4 MG/2ML IJ SOLN
INTRAMUSCULAR | Status: AC
  Filled 2019-12-04: qty 2

## 2019-12-04 MED ORDER — ENSURE MAX PROTEIN PO LIQD
2.0000 [oz_av] | ORAL | Status: DC
  Administered 2019-12-05 – 2019-12-06 (×11): 2 [oz_av] via ORAL

## 2019-12-04 MED ORDER — ONDANSETRON HCL 4 MG/2ML IJ SOLN
INTRAMUSCULAR | Status: DC | PRN
  Administered 2019-12-04: 4 mg via INTRAVENOUS

## 2019-12-04 MED ORDER — CHLORHEXIDINE GLUCONATE 0.12 % MT SOLN
15.0000 mL | Freq: Once | OROMUCOSAL | Status: AC
  Administered 2019-12-04: 15 mL via OROMUCOSAL

## 2019-12-04 MED ORDER — SIMETHICONE 80 MG PO CHEW
80.0000 mg | CHEWABLE_TABLET | Freq: Four times a day (QID) | ORAL | Status: DC | PRN
  Administered 2019-12-04 – 2019-12-06 (×3): 80 mg via ORAL
  Filled 2019-12-04 (×3): qty 1

## 2019-12-04 MED ORDER — LACTATED RINGERS IR SOLN
Status: DC | PRN
  Administered 2019-12-04: 1000 mL

## 2019-12-04 MED ORDER — FENTANYL CITRATE (PF) 250 MCG/5ML IJ SOLN
INTRAMUSCULAR | Status: DC | PRN
  Administered 2019-12-04: 100 ug via INTRAVENOUS
  Administered 2019-12-04 (×2): 50 ug via INTRAVENOUS

## 2019-12-04 MED ORDER — OXYCODONE HCL 5 MG/5ML PO SOLN
5.0000 mg | Freq: Four times a day (QID) | ORAL | Status: DC | PRN
  Filled 2019-12-04 (×2): qty 5

## 2019-12-04 MED ORDER — HYDROMORPHONE HCL 1 MG/ML IJ SOLN
INTRAMUSCULAR | Status: AC
  Filled 2019-12-04: qty 1

## 2019-12-04 MED ORDER — ACETAMINOPHEN 160 MG/5ML PO SOLN
1000.0000 mg | Freq: Three times a day (TID) | ORAL | Status: DC
  Administered 2019-12-05: 15:00:00 1000 mg via ORAL
  Filled 2019-12-04 (×2): qty 40.6

## 2019-12-04 MED ORDER — DEXAMETHASONE SODIUM PHOSPHATE 10 MG/ML IJ SOLN
INTRAMUSCULAR | Status: AC
  Filled 2019-12-04: qty 1

## 2019-12-04 MED ORDER — BUPIVACAINE LIPOSOME 1.3 % IJ SUSP
INTRAMUSCULAR | Status: DC | PRN
  Administered 2019-12-04: 20 mL

## 2019-12-04 MED ORDER — ACETAMINOPHEN 500 MG PO TABS
1000.0000 mg | ORAL_TABLET | Freq: Three times a day (TID) | ORAL | Status: DC
  Administered 2019-12-04 – 2019-12-06 (×5): 1000 mg via ORAL
  Filled 2019-12-04 (×7): qty 2

## 2019-12-04 MED ORDER — SUGAMMADEX SODIUM 500 MG/5ML IV SOLN
INTRAVENOUS | Status: AC
  Filled 2019-12-04: qty 5

## 2019-12-04 SURGICAL SUPPLY — 67 items
APPLIER CLIP 5 13 M/L LIGAMAX5 (MISCELLANEOUS)
APPLIER CLIP ROT 10 11.4 M/L (STAPLE)
APPLIER CLIP ROT 13.4 12 LRG (CLIP) ×3
BENZOIN TINCTURE PRP APPL 2/3 (GAUZE/BANDAGES/DRESSINGS) IMPLANT
BLADE SURG SZ11 CARB STEEL (BLADE) ×3 IMPLANT
BNDG ADH 1X3 SHEER STRL LF (GAUZE/BANDAGES/DRESSINGS) IMPLANT
CABLE HIGH FREQUENCY MONO STRZ (ELECTRODE) IMPLANT
CHLORAPREP W/TINT 26 (MISCELLANEOUS) ×3 IMPLANT
CLIP APPLIE 5 13 M/L LIGAMAX5 (MISCELLANEOUS) IMPLANT
CLIP APPLIE ROT 10 11.4 M/L (STAPLE) IMPLANT
CLIP APPLIE ROT 13.4 12 LRG (CLIP) ×2 IMPLANT
COVER SURGICAL LIGHT HANDLE (MISCELLANEOUS) ×3 IMPLANT
COVER WAND RF STERILE (DRAPES) ×3 IMPLANT
DERMABOND ADVANCED (GAUZE/BANDAGES/DRESSINGS) ×1
DERMABOND ADVANCED .7 DNX12 (GAUZE/BANDAGES/DRESSINGS) ×2 IMPLANT
DEVICE SUTURE ENDOST 10MM (ENDOMECHANICALS) ×3 IMPLANT
DRAIN CHANNEL 19F RND (DRAIN) IMPLANT
DRAIN PENROSE 0.25X18 (DRAIN) ×3 IMPLANT
ELECT L-HOOK LAP 45CM DISP (ELECTROSURGICAL) ×3
ELECTRODE L-HOOK LAP 45CM DISP (ELECTROSURGICAL) ×2 IMPLANT
EVACUATOR SILICONE 100CC (DRAIN) IMPLANT
GAUZE 4X4 16PLY RFD (DISPOSABLE) ×3 IMPLANT
GAUZE SPONGE 4X4 12PLY STRL (GAUZE/BANDAGES/DRESSINGS) IMPLANT
GLOVE BIOGEL PI IND STRL 7.0 (GLOVE) ×2 IMPLANT
GLOVE BIOGEL PI INDICATOR 7.0 (GLOVE) ×1
GLOVE SURG SS PI 7.0 STRL IVOR (GLOVE) ×3 IMPLANT
GOWN STRL REUS W/TWL LRG LVL3 (GOWN DISPOSABLE) ×3 IMPLANT
GOWN STRL REUS W/TWL XL LVL3 (GOWN DISPOSABLE) ×9 IMPLANT
GRASPER SUT TROCAR 14GX15 (MISCELLANEOUS) IMPLANT
HANDLE STAPLE EGIA 4 XL (STAPLE) ×3 IMPLANT
HOVERMATT SINGLE USE (MISCELLANEOUS) ×3 IMPLANT
KIT BASIN OR (CUSTOM PROCEDURE TRAY) ×3 IMPLANT
KIT GASTRIC LAVAGE 34FR ADT (SET/KITS/TRAYS/PACK) IMPLANT
KIT TURNOVER KIT A (KITS) IMPLANT
MARKER SKIN DUAL TIP RULER LAB (MISCELLANEOUS) ×3 IMPLANT
NEEDLE SPNL 22GX3.5 QUINCKE BK (NEEDLE) ×3 IMPLANT
PACK CARDIOVASCULAR III (CUSTOM PROCEDURE TRAY) ×3 IMPLANT
PENCIL SMOKE EVACUATOR (MISCELLANEOUS) IMPLANT
RELOAD EGIA 45 MED/THCK PURPLE (STAPLE) ×3 IMPLANT
RELOAD EGIA 45 TAN VASC (STAPLE) IMPLANT
RELOAD EGIA 60 MED/THCK PURPLE (STAPLE) ×15 IMPLANT
RELOAD EGIA 60 TAN VASC (STAPLE) ×9 IMPLANT
RELOAD ENDO STITCH 2.0 (ENDOMECHANICALS) ×33
SCISSORS LAP 5X45 EPIX DISP (ENDOMECHANICALS) ×3 IMPLANT
SET IRRIG TUBING LAPAROSCOPIC (IRRIGATION / IRRIGATOR) ×3 IMPLANT
SET TUBE SMOKE EVAC HIGH FLOW (TUBING) ×3 IMPLANT
SHEARS HARMONIC ACE PLUS 45CM (MISCELLANEOUS) ×3 IMPLANT
SLEEVE XCEL OPT CAN 5 100 (ENDOMECHANICALS) ×9 IMPLANT
SOL ANTI FOG 6CC (MISCELLANEOUS) ×2 IMPLANT
SOLUTION ANTI FOG 6CC (MISCELLANEOUS) ×1
STAPLER VISISTAT 35W (STAPLE) IMPLANT
STRIP CLOSURE SKIN 1/2X4 (GAUZE/BANDAGES/DRESSINGS) IMPLANT
SUT ETHILON 2 0 PS N (SUTURE) IMPLANT
SUT MNCRL AB 4-0 PS2 18 (SUTURE) ×3 IMPLANT
SUT RELOAD ENDO STITCH 2 48X1 (ENDOMECHANICALS) ×10
SUT RELOAD ENDO STITCH 2.0 (ENDOMECHANICALS) ×12
SUT SILK 0 SH 30 (SUTURE) IMPLANT
SUT VICRYL 0 TIES 12 18 (SUTURE) IMPLANT
SUTURE RELOAD END STTCH 2 48X1 (ENDOMECHANICALS) ×10 IMPLANT
SUTURE RELOAD ENDO STITCH 2.0 (ENDOMECHANICALS) ×12 IMPLANT
SYR 20ML LL LF (SYRINGE) ×3 IMPLANT
SYR 50ML LL SCALE MARK (SYRINGE) ×3 IMPLANT
TOWEL OR 17X26 10 PK STRL BLUE (TOWEL DISPOSABLE) ×3 IMPLANT
TOWEL OR NON WOVEN STRL DISP B (DISPOSABLE) ×3 IMPLANT
TROCAR BLADELESS OPT 5 100 (ENDOMECHANICALS) ×3 IMPLANT
TROCAR XCEL 12X100 BLDLESS (ENDOMECHANICALS) ×3 IMPLANT
TUBING CONNECTING 10 (TUBING) ×3 IMPLANT

## 2019-12-04 NOTE — Op Note (Signed)
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 Hassell Done  M.D., FACS   Anesthesia: Gen.   Indications for procedure: This patient was undergoing a gastric bypass by Dr. Kieth Brightly.    Description of procedure: The endoscopy was placed in the mouth and into the oropharynx and under endoscopic vision it was advanced to the esophagogastric junction.  The pouch was insufflated and was ~5 cm long.  A nice staple line was noted. .   No bleeding or leaks were detected.  The scope was withdrawn without difficulty.     Matt B. Hassell Done, MD, FACS General, Bariatric, & Minimally Invasive Surgery [facility] Surgery, [place]

## 2019-12-04 NOTE — Anesthesia Preprocedure Evaluation (Signed)
Anesthesia Evaluation  Patient identified by MRN, date of birth, ID band Patient awake    Reviewed: Allergy & Precautions, NPO status , Patient's Chart, lab work & pertinent test results  Airway Mallampati: II  TM Distance: >3 FB Neck ROM: Full    Dental no notable dental hx.    Pulmonary neg pulmonary ROS, Patient abstained from smoking., former smoker,    Pulmonary exam normal breath sounds clear to auscultation       Cardiovascular hypertension, Normal cardiovascular exam Rhythm:Regular Rate:Normal     Neuro/Psych negative neurological ROS  negative psych ROS   GI/Hepatic Neg liver ROS, GERD  ,  Endo/Other  Morbid obesity  Renal/GU negative Renal ROS  negative genitourinary   Musculoskeletal negative musculoskeletal ROS (+)   Abdominal   Peds negative pediatric ROS (+)  Hematology negative hematology ROS (+)   Anesthesia Other Findings   Reproductive/Obstetrics negative OB ROS                             Anesthesia Physical Anesthesia Plan  ASA: III  Anesthesia Plan: General   Post-op Pain Management:    Induction: Intravenous  PONV Risk Score and Plan: 4 or greater and Ondansetron, Dexamethasone, Scopolamine patch - Pre-op, Midazolam and Treatment may vary due to age or medical condition  Airway Management Planned: Oral ETT  Additional Equipment:   Intra-op Plan:   Post-operative Plan: Extubation in OR  Informed Consent: I have reviewed the patients History and Physical, chart, labs and discussed the procedure including the risks, benefits and alternatives for the proposed anesthesia with the patient or authorized representative who has indicated his/her understanding and acceptance.     Dental advisory given  Plan Discussed with: CRNA and Surgeon  Anesthesia Plan Comments:         Anesthesia Quick Evaluation

## 2019-12-04 NOTE — Progress Notes (Signed)
Discussed post op day goals with patient including ambulation, IS, diet progression, pain, and nausea control.  BSTOP education provided including BSTOP information guide, "Guide for Pain Management after your Bariatric Procedure".  Questions answered. 

## 2019-12-04 NOTE — Anesthesia Procedure Notes (Signed)
Procedure Name: Intubation Date/Time: 12/04/2019 8:52 AM Performed by: Eben Burow, CRNA Pre-anesthesia Checklist: Patient identified, Emergency Drugs available, Suction available, Patient being monitored and Timeout performed Patient Re-evaluated:Patient Re-evaluated prior to induction Oxygen Delivery Method: Circle system utilized Preoxygenation: Pre-oxygenation with 100% oxygen Induction Type: IV induction Ventilation: Mask ventilation without difficulty Laryngoscope Size: Mac and 4 Grade View: Grade I Tube type: Oral Tube size: 7.5 mm Number of attempts: 1 Airway Equipment and Method: Stylet Placement Confirmation: ETT inserted through vocal cords under direct vision,  positive ETCO2 and breath sounds checked- equal and bilateral Secured at: 22 cm Tube secured with: Tape Dental Injury: Teeth and Oropharynx as per pre-operative assessment

## 2019-12-04 NOTE — Progress Notes (Signed)
Patient has started water.

## 2019-12-04 NOTE — H&P (Signed)
Madison [name] is an 44 y.o. female.   Chief Complaint: obesity HPI: 44 yo female with long history of obesity she also has significant gerd and borderline hypertension. She presents for bariatric surgery.  Past Medical History:  Diagnosis Date  . Colitis 07/2018  . GERD (gastroesophageal reflux disease)   . Uterine fibroid     Past Surgical History:  Procedure Laterality Date  . ABDOMINAL HYSTERECTOMY  2013    Family History  Problem Relation Age of Onset  . Hypertension Mother   . Hypertension Sister    Social History:  reports that she quit smoking about 3 months ago. Her smoking use included cigarettes. She has a 22.00 pack-year smoking history. She has never used smokeless tobacco. She reports current alcohol use. She reports that she does not use drugs.  Allergies:  Allergies  Allergen Reactions  . Oxycodone Nausea Only  . Adhesive [Tape] Rash    Medications Prior to Admission  Medication Sig Dispense Refill  . amLODipine (NORVASC) 10 MG tablet Take 10 mg by mouth daily.    . Multiple Vitamin (MULTIVITAMIN WITH MINERALS) TABS tablet Take 1 tablet by mouth daily. Multivitamin for Women    . betamethasone dipropionate 0.05 % lotion Apply 1 application topically 2 (two) times daily as needed (irritation.).       No results found for this or any previous visit (from the past 48 hour(s)). No results found.  Review of Systems  Constitutional: Negative for chills and fever.  HENT: Negative for hearing loss.   Respiratory: Negative for cough.   Cardiovascular: Negative for chest pain and palpitations.  Gastrointestinal: Negative for abdominal pain, nausea and vomiting.  Genitourinary: Negative for dysuria and urgency.  Musculoskeletal: Negative for myalgias and neck pain.  Skin: Negative for rash.  Neurological: Negative for dizziness and headaches.  Hematological: Does not bruise/bleed easily.  Psychiatric/Behavioral: Negative for suicidal ideas.    Blood  pressure (!) 156/108, pulse 84, temperature 98.3 F (36.8 C), temperature source Oral, resp. rate 16, height 5\' 1"  (1.549 m), weight 112.2 kg, last menstrual period 01/17/2012, SpO2 100 %. Physical Exam Vitals reviewed.  Constitutional:      Appearance: She is well-developed.  HENT:     Head: Normocephalic and atraumatic.  Eyes:     Conjunctiva/sclera: Conjunctivae normal.     Pupils: Pupils are equal, round, and reactive to light.  Cardiovascular:     Rate and Rhythm: Normal rate and regular rhythm.  Pulmonary:     Effort: Pulmonary effort is normal.     Breath sounds: Normal breath sounds.  Abdominal:     General: Bowel sounds are normal. There is no distension.     Palpations: Abdomen is soft.     Tenderness: There is no abdominal tenderness.  Musculoskeletal:        General: Normal range of motion.     Cervical back: Normal range of motion and neck supple.  Skin:    General: Skin is warm and dry.  Neurological:     Mental Status: She is alert and oriented to person, place, and time.  Psychiatric:        Behavior: Behavior normal.      Assessment/Plan 44 yo female with class III obesity -lap RNY gastric bypass -inpatient admission -[facility]AS and bariatric protocols  Mickeal Skinner, MD 12/04/2019, 7:07 AM

## 2019-12-04 NOTE — Progress Notes (Signed)
Patient has completed 2.5 oz of tea/water. Patient has ambulated in the unit and demonstrated IS use. Patient has c/o pressure in chest and shoulders. PRN given.

## 2019-12-04 NOTE — Progress Notes (Signed)
PHARMACY CONSULT FOR:  Risk Assessment for Post-Discharge VTE Following Bariatric Surgery  Post-Discharge VTE Risk Assessment: This patient's probability of 30-day post-discharge VTE is increased due to the factors marked:   Female    Age >/=60 years    BMI >/=50 kg/m2    CHF    Dyspnea at Rest    Paraplegia   x Non-gastric-band surgery    Operation Time >/=3 hr    Return to OR     Length of Stay >/= 3 d   Hx of VTE   Hypercoagulable condition   Significant venous stasis    Predicted probability of 30-day post-discharge VTE: 0.16%  Recommendation for Discharge: No pharmacologic prophylaxis post-discharge  Madison [name] is a 44 y.o. female who underwent  Roux-en-Y gastric bypass on 8/3   Case start: 0909 Case end: 1048   Allergies  Allergen Reactions  . Oxycodone Nausea Only  . Adhesive [Tape] Rash    Patient Measurements: Height: 5\' 1"  (154.9 cm) Weight: 112.2 kg (247 lb 6.4 oz) IBW/kg (Calculated) : 47.8 Body mass index is 46.75 kg/m.  Recent Labs    12/04/19 1132  HGB 12.2  HCT 39.8   Estimated Creatinine Clearance: 104.3 mL/min (by C-G formula based on SCr of 0.71 mg/dL).    Past Medical History:  Diagnosis Date  . Colitis 07/2018  . GERD (gastroesophageal reflux disease)   . Uterine fibroid      Medications Prior to Admission  Medication Sig Dispense Refill Last Dose  . amLODipine (NORVASC) 10 MG tablet Take 10 mg by mouth daily.   12/04/2019 at 0230  . Multiple Vitamin (MULTIVITAMIN WITH MINERALS) TABS tablet Take 1 tablet by mouth daily. Multivitamin for Women   Past Week at Unknown time  . betamethasone dipropionate 0.05 % lotion Apply 1 application topically 2 (two) times daily as needed (irritation.).    More than a month at Unknown time     Lenis Noon, PharmD 12/04/2019,12:42 PM

## 2019-12-04 NOTE — Anesthesia Postprocedure Evaluation (Signed)
Anesthesia Post Note  Patient: Madison [name]  Procedure(s) Performed: LAPAROSCOPIC ROUX-EN-Y GASTRIC BYPASS (N/A Abdomen) UPPER GASTROINTESTINAL ENDOSCOPY (N/A Esophagus)     Patient location during evaluation: PACU Anesthesia Type: General Level of consciousness: awake and alert Pain management: pain level controlled Vital Signs Assessment: post-procedure vital signs reviewed and stable Respiratory status: spontaneous breathing, nonlabored ventilation, respiratory function stable and patient connected to nasal cannula oxygen Cardiovascular status: blood pressure returned to baseline and stable Postop Assessment: no apparent nausea or vomiting Anesthetic complications: no   No complications documented.  Last Vitals:  Vitals:   12/04/19 1514 12/04/19 1752  BP: (!) 145/95 (!) 144/104  Pulse: (!) 101 (!) 104  Resp: 16 15  Temp: 36.5 C 36.8 C  SpO2:  100%    Last Pain:  Vitals:   12/04/19 1514  TempSrc: Oral  PainSc:                  Nakiea Metzner S

## 2019-12-04 NOTE — Op Note (Signed)
Preop Diagnosis: Obesity Class III  Postop Diagnosis: same  Procedure performed: laparoscopic Roux en Y gastric bypass  Assitant: Kaylyn Lim  Indications:  The patient is a 44 y.o. year-old morbidly obese female who has been followed in the Bariatric Clinic as an outpatient. This patient was diagnosed with morbid obesity with a BMI of Body mass index is 46.75 kg/m. and significant co-morbidities including hypertension and GERD.  The patient was counseled extensively in the Bariatric Outpatient Clinic and after a thorough explanation of the risks and benefits of surgery (including death from complications, bowel leak, infection such as peritonitis and/or sepsis, internal hernia, bleeding, need for blood transfusion, bowel obstruction, organ failure, pulmonary embolus, deep venous thrombosis, wound infection, incisional hernia, skin breakdown, and others entailed on the consent form) and after a compliant diet and exercise program, the patient was scheduled for an elective laparoscopic gastric bypass.  Description of Operation:  Following informed consent, the patient was taken to the operating room and placed on the operating table in the supine position.  She had previously received prophylactic antibiotics and subcutaneous heparin for DVT prophylaxis in the pre-op holding area.  After induction of general endotracheal anesthesia by the anesthesiologist, the patient underwent placement of sequential compression devices, Foley catheter and an oro-gastric tube.  A timeout was confirmed by the surgery and anesthesia teams.  The patient was adequately padded at all pressure points and placed on a footboard to prevent slippage from the OR table during extremes of position during surgery.  She underwent a routine sterile prep and drape of her entire abdomen.    Next, A transverse incision was made under the left subcostal area and a 70mm optical viewing trocar was introduced into the peritoneal cavity.  Pneumoperitoneum was applied with a high flow and low pressure. A laparoscope was inserted to confirm placement. A extraperitoneal block was then placed at the lateral abdominal wall using exparel diluted with marcaine . 5 additional trocars were placed: 1 81mm trocar to the left of the midline. 1 additional 80mm trocar in the left lateral area, 1 72mm trocar in the right mid abdomen, and 1 35mm trocar in the right subcostal area.  The greater omentum was flipped over the transverse colon and under the left lobe of the liver. The ligament of trietz was identified. 40cm of jejunum was measured starting from the ligament of Trietz. The mesentery was checked to ensure mobility. Next, a 15mm 2-55mm tristapler was used to divide the jejunum at this location. The harmonic scalpel was used to divide the mesentery down to the origin. A 1/2" penrose was sutured to the distal side. 100cm of jejunum was measured starting at the division. 2-0 silk was used to appose the biliary limb to the 100cm mark of jejunum in 2 places. Enterotomies were made in the biliary and common channels and a 40mm 2-3 tristapler was used to create the J-J anastomosis. A 2-0 silk was used to appose the enterotomy edges and a 47mm 2-3 tristapler was used to close the enterotomy. An anti-obstruction 2-0 silk suture was placed. Next, the mesenteric defect was closed with a 2-0 silk in running fashion.The J-J appeared patent and in neutral position.  Next, the omentum was divided using the Harmonic scalpel. The patient was placed in steep Reverse Trendelenberg position. A Nathanson retracted was placed through a subxiphoid incision and used to retract the liver. The fat pad over the fundus was incised to free the fundus. Next, a position along the lesser curve  6cm from GE junction was identified. The pars flaccida was entered and the fat over the lesser curve divided to enter the lesser sac. Multiple 33mm 3-63mm tristaple firings were peformed to create a  6cm pouch. There was one area of bleeding on the remnant staple line that a clip was applied to for hemostasis. The Roux limb was identified using the placed penrose and brought up to the stomach in antecolic fashion. The limb was inspected to ensure a neutral position. A 2-0 vicryl suture was then used to create a posterior layer connecting the stomach to the Roux limb jejunum in running fashion. Next cautery was used to create an enterotomy along the medial aspect of this suture line and Harmonic scalpel used to create gastotomy. A 69mm 3-40mm tristapler was then used to create a 25-18mm anastomosis. 2 2-0 vicryl sutures were used in running fashion to close the gastrotomy. Finally, a 2-0 vicryl suture was used to close an anterior layer of stomach and jejunum over the anastomosis in running fashion. The penrose was removed from the Roux limb. A 2-0 silk was used to appose the transverse mesocolon to the mesentery of the Roux limb.  The assistant then went and performed an upper endoscopy and leak test. No bubbles were seen and the pouch and limb distended appropriately. The limb and pouch were deflated, the endoscope was removed. Hemostasis was ensured. Pneumoperitoneum was evacuated, all ports were removed and all incisions closed with 4-0 monocryl suture in subcuticular fashion. Steristrips and bandaids were put in place for dressing. The patient awoke from anesthesia and was brought to pacu in stable condition. All counts were correct.  Specimens:  None  Estimated Blood Loss: 20 ml  Local Anesthesia: 50 ml Exparel: 0.5% Marcaine Mix  Post-Op Plan:       Pain Management: PO, prn      Antibiotics: Prophylactic      Anticoagulation: Prophylactic, Starting now      Post Op Studies/Consults: Not applicable      Intended Discharge: within 48h      Intended Outpatient Follow-Up: Two Week      Intended Outpatient Studies: Not Applicable      Other: Not Applicable   Arta Bruce Akil Hoos

## 2019-12-04 NOTE — Discharge Instructions (Signed)
   GASTRIC BYPASS/SLEEVE  Home Care Instructions   These instructions are to help you care for yourself when you go home.  Call: If you have any problems. . Call [phone] and ask for the surgeon on call . If you need immediate help, come to the ER at [facility].  . Tell the ER staff that you are a new post-op gastric bypass or gastric sleeve patient   Signs and symptoms to report: . Severe vomiting or nausea o If you cannot keep down clear liquids for longer than 1 day, call your surgeon  . Abdominal pain that does not get better after taking your pain medication . Fever over 100.4 F with chills . Heart beating over 100 beats a minute . Shortness of breath at rest . Chest pain .  Redness, swelling, drainage, or foul odor at incision (surgical) sites .  If your incisions open or pull apart . Swelling or pain in calf (lower leg) . Diarrhea (Loose bowel movements that happen often), frequent watery, uncontrolled bowel movements . Constipation, (no bowel movements for 3 days) if this happens: Pick one o Milk of Magnesia, 2 tablespoons by mouth, 3 times a day for 2 days if needed o Stop taking Milk of Magnesia once you have a bowel movement o Call your doctor if constipation continues Or o Miralax  (instead of Milk of Magnesia) following the label instructions o Stop taking Miralax once you have a bowel movement o Call your doctor if constipation continues . Anything you think is not normal   Normal side effects after surgery: . Unable to sleep at night or unable to focus . Irritability or moody . Being tearful (crying) or depressed These are common complaints, possibly related to your anesthesia medications that put you to sleep, stress of surgery, and change in lifestyle.  This usually goes away a few weeks after surgery.  If these feelings continue, call your primary care doctor.   Wound Care: You may have surgical glue, steri-strips, or staples over your incisions after  surgery . Surgical glue:  Looks like a clear film over your incisions and will wear off a little at a time . Steri-strips: Strips of tape over your incisions. You may notice a yellowish color on the skin under the steri-strips. This is used to make the   steri-strips stick better. Do not pull the steri-strips off - let them fall off . Staples: Staples may be removed before you leave the hospital o If you go home with staples, call [facility], [phone] at for an appointment with your surgeon's nurse to have staples removed 10 days after surgery. . Showering: You may shower two (2) days after your surgery unless your surgeon tells you differently o Wash gently around incisions with warm soapy water, rinse well, and gently pat dry  o No tub baths until staples are removed, steri-strips fall off or glue is gone.    Medications: . Medications should be liquid or crushed if larger than the size of a dime . Extended release pills (medication that release a little bit at a time through the day) should NOT be crushed or cut. (examples include XL, ER, DR, SR) . Depending on the size and number of medications you take, you may need to space (take a few throughout the day)/change the time you take your medications so that you do not over-fill your pouch (smaller stomach) . Make sure you follow-up with your primary care doctor to   make medication changes needed during rapid weight loss and life-style changes . If you have diabetes, follow up with the doctor that orders your diabetes medication(s) within one week after surgery and check your blood sugar regularly. . Do not drive while taking prescription pain medication  . It is ok to take Tylenol by the bottle instructions with your pain medicine or instead of your pain medicine as needed.  DO NOT TAKE NSAIDS (EXAMPLES OF NSAIDS:  IBUPROFREN/ NAPROXEN)  Diet:                    First 2 Weeks  You will see the dietician t about two (2) weeks  after your surgery. The dietician will increase the types of foods you can eat if you are handling liquids well: . If you have severe vomiting or nausea and cannot keep down clear liquids lasting longer than 1 day, call your surgeon @ ([phone]) Protein Shake . Drink at least 2 ounces of shake 5-6 times per day . Each serving of protein shakes (usually 8 - 12 ounces) should have: o 15 grams of protein  o And no more than 5 grams of carbohydrate  . Goal for protein each day: o Men = 80 grams per day o Women = 60 grams per day . Protein powder may be added to fluids such as non-fat milk or Lactaid milk or unsweetened Soy/Almond milk (limit to 35 grams added protein powder per serving)  Hydration . Slowly increase the amount of water and other clear liquids as tolerated (See Acceptable Fluids) . Slowly increase the amount of protein shake as tolerated  .  Sip fluids slowly and throughout the day.  Do not use straws. . May use sugar substitutes in small amounts (no more than 6 - 8 packets per day; i.e. Splenda)  Fluid Goal . The first goal is to drink at least 8 ounces of protein shake/drink per day (or as directed by the nutritionist); some examples of protein shakes are Syntrax Nectar, Adkins Advantage, EAS Edge HP, and Unjury. See handout from pre-op Bariatric Education Class: o Slowly increase the amount of protein shake you drink as tolerated o You may find it easier to slowly sip shakes throughout the day o It is important to get your proteins in first . Your fluid goal is to drink 64 - 100 ounces of fluid daily o It may take a few weeks to build up to this . 32 oz (or more) should be clear liquids  And  . 32 oz (or more) should be full liquids (see below for examples) . Liquids should not contain sugar, caffeine, or carbonation  Clear Liquids: . Water or Sugar-free flavored water (i.e. Fruit H2O, Propel) . Decaffeinated coffee or tea (sugar-free) . Crystal Lite, Wyler's Lite,  Minute Maid Lite . Sugar-free Jell-O . Bouillon or broth . Sugar-free Popsicle:   *Less than 20 calories each; Limit 1 per day  Full Liquids: Protein Shakes/Drinks + 2 choices per day of other full liquids . Full liquids must be: o No More Than 15 grams of Carbs per serving  o No More Than 3 grams of Fat per serving . Strained low-fat cream soup (except Cream of Potato or Tomato) . Non-Fat milk . Fat-free Lactaid Milk . Unsweetened Soy Or Unsweetened Almond Milk . Low Sugar yogurt (Dannon Lite & Fit, Greek yogurt; Oikos Triple Zero; Chobani Simply 100; Yoplait 100 calorie Greek - No Fruit on the Bottom)    Vitamins   and Minerals . Start 1 day after surgery unless otherwise directed by your surgeon . Chewable Bariatric Specific Multivitamin / Multimineral Supplement with iron (Example: Bariatric Advantage Multi EA) . Chewable Calcium with Vitamin D-3 (Example: 3 Chewable Calcium Plus 600 with Vitamin D-3) o Take 500 mg three (3) times a day for a total of 1500 mg each day o Do not take all 3 doses of calcium at one time as it may cause constipation, and you can only absorb 500 mg  at a time  o Do not mix multivitamins containing iron with calcium supplements; take 2 hours apart . Menstruating women and those with a history of anemia (a blood disease that causes weakness) may need extra iron o Talk with your doctor to see if you need more iron . Do not stop taking or change any vitamins or minerals until you talk to your dietitian or surgeon . Your Dietitian and/or surgeon must approve all vitamin and mineral supplements   Activity and Exercise: Limit your physical activity as instructed by your doctor.  It is important to continue walking at home.  During this time, use these guidelines: . Do not lift anything greater than ten (10) pounds for at least two (2) weeks . Do not go back to work or drive until your surgeon says you can . You may have sex when you feel comfortable  o It is  VERY important for female patients to use a reliable birth control method; fertility often increases after surgery  o All hormonal birth control will be ineffective for 30 days after surgery due to medications given during surgery a barrier method must be used. o Do not get pregnant for at least 18 months . Start exercising as soon as your doctor tells you that you can o Make sure your doctor approves any physical activity . Start with a simple walking program . Walk 5-15 minutes each day, 7 days per week.  . Slowly increase until you are walking 30-45 minutes per day Consider joining our BELT program. [phone] or email [email or web address]   Special Instructions Things to remember: . Use your CPAP when sleeping if this applies to you  . [facility] has two free Bariatric Surgery Support Groups that meet monthly o The 3rd Thursday of each month, 6 pm o The 2nd Friday of each month, 11:30 . It is very important to keep all follow up appointments with your surgeon, dietitian, primary care physician, and behavioral health practitioner . Routine follow up schedule with your surgeon include appointments at 2-3 weeks, 6-8 weeks, 6 months, and 1 year at a minimum.  Your surgeon may request to see you more often. . After the first year, please follow up with your bariatric surgeon and dietitian at least once a year in order to maintain best weight loss results   [facility]: [phone] [facility]: [phone] Bariatric Nurse Coordinator: [phone]      Reviewed and Endorsed  by [facility] Patient Education Committee, June, 2016 Edits Approved: Aug, 2018    

## 2019-12-04 NOTE — Transfer of Care (Signed)
Immediate Anesthesia Transfer of Care Note  Patient: Madison [name]  Procedure(s) Performed: LAPAROSCOPIC ROUX-EN-Y GASTRIC BYPASS (N/A Abdomen) UPPER GASTROINTESTINAL ENDOSCOPY (N/A Esophagus)  Patient Location: PACU  Anesthesia Type:General  Level of Consciousness: awake, alert  and patient cooperative  Airway & Oxygen Therapy: Patient Spontanous Breathing and Patient connected to face mask oxygen  Post-op Assessment: Report given to RN and Post -op Vital signs reviewed and stable  Post vital signs: Reviewed and stable  Last Vitals:  Vitals Value Taken Time  BP 161/113 12/04/19 1104  Temp    Pulse 100 12/04/19 1106  Resp 15 12/04/19 1106  SpO2 100 % 12/04/19 1106  Vitals shown include unvalidated device data.  Last Pain:  Vitals:   12/04/19 0558  TempSrc: Oral  PainSc:       Patients Stated Pain Goal: 4 (61/60/73 7106)  Complications: No complications documented.

## 2019-12-05 ENCOUNTER — Encounter (HOSPITAL_COMMUNITY): Payer: Self-pay | Admitting: General Surgery

## 2019-12-05 LAB — COMPREHENSIVE METABOLIC PANEL
ALT: 48 U/L — ABNORMAL HIGH (ref 0–44)
AST: 45 U/L — ABNORMAL HIGH (ref 15–41)
Albumin: 2.9 g/dL — ABNORMAL LOW (ref 3.5–5.0)
Alkaline Phosphatase: 64 U/L (ref 38–126)
Anion gap: 11 (ref 5–15)
BUN: 8 mg/dL (ref 6–20)
CO2: 21 mmol/L — ABNORMAL LOW (ref 22–32)
Calcium: 8.4 mg/dL — ABNORMAL LOW (ref 8.9–10.3)
Chloride: 102 mmol/L (ref 98–111)
Creatinine, Ser: 0.78 mg/dL (ref 0.44–1.00)
GFR calc Af Amer: >60 mL/min (ref >60)
GFR calc non Af Amer: >60 mL/min (ref >60)
Glucose, Bld: 104 mg/dL — ABNORMAL HIGH (ref 70–99)
Potassium: 4.7 mmol/L (ref 3.5–5.1)
Sodium: 134 mmol/L — ABNORMAL LOW (ref 135–145)
Total Bilirubin: 0.6 mg/dL (ref 0.3–1.2)
Total Protein: 7.3 g/dL (ref 6.5–8.1)

## 2019-12-05 LAB — CBC WITH DIFFERENTIAL/PLATELET
Abs Immature Granulocytes: 0.07 10*3/uL (ref 0.00–0.07)
Basophils Absolute: 0 10*3/uL (ref 0.0–0.1)
Basophils Relative: 0 %
Eosinophils Absolute: 0 10*3/uL (ref 0.0–0.5)
Eosinophils Relative: 0 %
HCT: 37.6 % (ref 36.0–46.0)
Hemoglobin: 11.9 g/dL — ABNORMAL LOW (ref 12.0–15.0)
Immature Granulocytes: 0 %
Lymphocytes Relative: 11 %
Lymphs Abs: 2.2 10*3/uL (ref 0.7–4.0)
MCH: 26.1 pg (ref 26.0–34.0)
MCHC: 31.6 g/dL (ref 30.0–36.0)
MCV: 82.5 fL (ref 80.0–100.0)
Monocytes Absolute: 1.1 10*3/uL — ABNORMAL HIGH (ref 0.1–1.0)
Monocytes Relative: 5 %
Neutro Abs: 17.5 10*3/uL — ABNORMAL HIGH (ref 1.7–7.7)
Neutrophils Relative %: 84 %
Platelets: 387 10*3/uL (ref 150–400)
RBC: 4.56 MIL/uL (ref 3.87–5.11)
RDW: 14.6 % (ref 11.5–15.5)
WBC: 20.9 10*3/uL — ABNORMAL HIGH (ref 4.0–10.5)
nRBC: 0 % (ref 0.0–0.2)

## 2019-12-05 LAB — CBC
HCT: 38.7 % (ref 36.0–46.0)
Hemoglobin: 12.2 g/dL (ref 12.0–15.0)
MCH: 25.8 pg — ABNORMAL LOW (ref 26.0–34.0)
MCHC: 31.5 g/dL (ref 30.0–36.0)
MCV: 81.8 fL (ref 80.0–100.0)
Platelets: 427 10*3/uL — ABNORMAL HIGH (ref 150–400)
RBC: 4.73 MIL/uL (ref 3.87–5.11)
RDW: 14.6 % (ref 11.5–15.5)
WBC: 19.7 10*3/uL — ABNORMAL HIGH (ref 4.0–10.5)
nRBC: 0 % (ref 0.0–0.2)

## 2019-12-05 MED ORDER — METOPROLOL TARTRATE 5 MG/5ML IV SOLN
5.0000 mg | Freq: Four times a day (QID) | INTRAVENOUS | Status: DC | PRN
  Administered 2019-12-05: 18:00:00 5 mg via INTRAVENOUS
  Filled 2019-12-05: qty 5

## 2019-12-05 MED ORDER — AMLODIPINE BESYLATE 10 MG PO TABS
10.0000 mg | ORAL_TABLET | Freq: Every day | ORAL | Status: DC
  Administered 2019-12-05: 10 mg via ORAL
  Filled 2019-12-05: qty 1

## 2019-12-05 MED ORDER — HYDROCODONE-ACETAMINOPHEN 5-325 MG PO TABS: 1.0000 | ORAL_TABLET | ORAL | Status: DC | PRN

## 2019-12-05 MED ORDER — HYDROCODONE-ACETAMINOPHEN 7.5-325 MG/15ML PO SOLN
10.0000 mL | ORAL | Status: DC | PRN
  Administered 2019-12-05 – 2019-12-06 (×4): 10 mL via ORAL
  Filled 2019-12-05 (×4): qty 15

## 2019-12-05 NOTE — Progress Notes (Signed)
Patient alert and oriented, Post op day 1.  Provided support and encouragement.  Encouraged pulmonary toilet, ambulation and small sips of liquids.  Completed 12 ounces bari clear fluids starting protein this morning. All questions answered.  Will continue to monitor.

## 2019-12-05 NOTE — Progress Notes (Signed)
Patient has oxycodone allergy and would not take oral oxycodone due to the c/o of them making her nauseous and causes vomiting. Patient was given IV meds instead of oral meds. Madison [name]

## 2019-12-05 NOTE — Progress Notes (Signed)
   Progress Note: Metabolic and Bariatric Surgery Service   Chief Complaint/Subjective: Pain issues in upper abdomen, no nausea, jittering this morning after walk  Objective: Vital signs in last 24 hours: Temp:  [97.5 F (36.4 C)-98.4 F (36.9 C)] 98.1 F (36.7 C) (08/04 0622) Pulse Rate:  [88-105] 88 (08/04 0622) Resp:  [10-19] 19 (08/04 0622) BP: (129-161)/(82-114) 144/89 (08/04 0625) SpO2:  [94 %-100 %] 98 % (08/04 0622) Last BM Date: 12/04/19  Intake/Output from previous day: 08/03 0701 - 08/04 0700 In: 3871.1 [P.O.:270; I.V.:3401; IV Piggyback:200] Out: 320 [Urine:300; Blood:20] Intake/Output this shift: No intake/output data recorded.  Lungs: nonlabored  Cardiovascular: RRR  Abd: soft, epigastric pain, no erythema, incisions c/d/i, no periincisional pain  Extremities: no edema  Neuro: AOx4  Lab Results: CBC  Recent Labs    12/04/19 1132 12/05/19 0533  WBC  --  20.9*  HGB 12.2 11.9*  HCT 39.8 37.6  PLT  --  387   BMET Recent Labs    12/05/19 0533  NA 134*  K 4.7  CL 102  CO2 21*  GLUCOSE 104*  BUN 8  CREATININE 0.78  CALCIUM 8.4*   PT/INR No results for input(s): LABPROT, INR in the last 72 hours. ABG No results for input(s): PHART, HCO3 in the last 72 hours.  Invalid input(s): PCO2, PO2  Studies/Results:  Anti-infectives: Anti-infectives (From admission, onward)   Start     Dose/Rate Route Frequency Ordered Stop   12/04/19 0600  cefoTEtan (CEFOTAN) 2 g in sodium chloride 0.9 % 100 mL IVPB        2 g 200 mL/hr over 30 Minutes Intravenous On call to O.R. 12/04/19 0532 12/04/19 0853      Medications: Scheduled Meds: . acetaminophen  1,000 mg Oral Q8H   Or  . acetaminophen (TYLENOL) oral liquid 160 mg/5 mL  1,000 mg Oral Q8H  . amLODipine  10 mg Oral Daily  . enoxaparin (LOVENOX) injection  30 mg Subcutaneous Q12H  . Ensure Max Protein  2 oz Oral Q2H   Continuous Infusions: . dextrose 5 % and 0.45% NaCl 125 mL/hr at 12/04/19  1330  . famotidine (PEPCID) IV 20 mg (12/04/19 2239)   PRN Meds:.HYDROcodone-acetaminophen, morphine injection, ondansetron (ZOFRAN) IV, simethicone  Assessment/Plan: Patient Active Problem List   Diagnosis Date Noted  . Morbid obesity ([place]) 12/04/2019  . Obesity 07/13/2019  . Essential hypertension 11/09/2018  . Dysuria 11/09/2018  . Colitis 11/08/2018  . Morbid obesity with BMI of 40.0-44.9, adult ([place]) 09/12/2014  . Current smoker 09/12/2014  . Bilateral low back pain with left-sided sciatica 08/13/2014   s/p Procedure(s): LAPAROSCOPIC ROUX-EN-Y GASTRIC BYPASS UPPER GASTROINTESTINAL ENDOSCOPY 12/04/2019 -pain issues, changed oral narcotic to hydrocodone liquid, likely gas pain -leukocytosis to 20 from 14 preop, recheck midday -continue to watch for another day due to pain -restart amlodipine home medication  Disposition:  LOS: 1 day  The patient will be in the hospital for normal postop protocol  Mickeal Skinner, MD [phone] [facility]

## 2019-12-05 NOTE — Progress Notes (Signed)
Patient has demonstrated IS use and has walked around the unit twice. She is on her 3rd cup of protein. No complaints of nausea. Complaints of pain 6/10 after pain med given. Jittery on assessment only in her hands.

## 2019-12-05 NOTE — Progress Notes (Signed)
Elevated BP (171/113) at 2 pm check.  MD aware, orders received.  Bedside RN aware of prn order.

## 2019-12-06 LAB — CBC WITH DIFFERENTIAL/PLATELET
Abs Immature Granulocytes: 0.03 10*3/uL (ref 0.00–0.07)
Basophils Absolute: 0 10*3/uL (ref 0.0–0.1)
Basophils Relative: 0 %
Eosinophils Absolute: 0 10*3/uL (ref 0.0–0.5)
Eosinophils Relative: 0 %
HCT: 35.1 % — ABNORMAL LOW (ref 36.0–46.0)
Hemoglobin: 11.2 g/dL — ABNORMAL LOW (ref 12.0–15.0)
Immature Granulocytes: 0 %
Lymphocytes Relative: 32 %
Lymphs Abs: 3.7 10*3/uL (ref 0.7–4.0)
MCH: 26.2 pg (ref 26.0–34.0)
MCHC: 31.9 g/dL (ref 30.0–36.0)
MCV: 82.2 fL (ref 80.0–100.0)
Monocytes Absolute: 1.1 10*3/uL — ABNORMAL HIGH (ref 0.1–1.0)
Monocytes Relative: 10 %
Neutro Abs: 6.5 10*3/uL (ref 1.7–7.7)
Neutrophils Relative %: 58 %
Platelets: 360 10*3/uL (ref 150–400)
RBC: 4.27 MIL/uL (ref 3.87–5.11)
RDW: 15 % (ref 11.5–15.5)
WBC: 11.4 10*3/uL — ABNORMAL HIGH (ref 4.0–10.5)
nRBC: 0 % (ref 0.0–0.2)

## 2019-12-06 MED ORDER — ONDANSETRON 4 MG PO TBDP
4.0000 mg | ORAL_TABLET | Freq: Four times a day (QID) | ORAL | 0 refills | Status: DC | PRN
Start: 2019-12-06 — End: 2020-01-16

## 2019-12-06 MED ORDER — ACETAMINOPHEN 500 MG PO TABS: 1000.0000 mg | ORAL_TABLET | Freq: Three times a day (TID) | ORAL | 0 refills | Status: AC

## 2019-12-06 MED ORDER — PANTOPRAZOLE SODIUM 40 MG PO TBEC
40.0000 mg | DELAYED_RELEASE_TABLET | Freq: Every day | ORAL | 0 refills | Status: DC
Start: 2019-12-06 — End: 2020-01-16

## 2019-12-06 MED ORDER — GABAPENTIN 100 MG PO CAPS: 200.0000 mg | ORAL_CAPSULE | Freq: Two times a day (BID) | ORAL | 0 refills | Status: DC

## 2019-12-06 MED ORDER — HYDROCODONE-ACETAMINOPHEN 7.5-325 MG/15ML PO SOLN: 10.0000 mL | ORAL | 0 refills | Status: DC | PRN

## 2019-12-06 NOTE — Progress Notes (Signed)
Madison [name], completed bariatric teaching as we as reviewed AVS. Pt verbalized understanding of all dc instructions including medications, follow up care and when to call the doctor. Assessment unchanged and ready to leave. Discharged via wc to front entrance to meet awaiting vehicle to carry home. Accompanied by NT.

## 2019-12-06 NOTE — Progress Notes (Signed)
Patient alert and oriented, pain is controlled. Patient is tolerating fluids, advanced to protein shake today, patient is tolerating well. Reviewed Gastric Bypass discharge instructions with patient and patient is able to articulate understanding. Provided information on BELT program, Support Group and [facility] outpatient pharmacy. All questions answered, will continue to monitor.   Total fluid intake 690 Per dehydration protocol call back one week postop  

## 2019-12-06 NOTE — Progress Notes (Signed)
Patient alert and oriented, Post op day 2.  Provided support and encouragement.  Encouraged pulmonary toilet, ambulation and small sips of liquids.  Pain controlled with Hycet, kpad added for comfort overnight.   All questions answered.  Will continue to monitor.

## 2019-12-06 NOTE — Discharge Summary (Signed)
Physician Discharge Summary  Madison [name] GUR:[id number] DOB: 1975-06-21 DOA: 12/04/2019  PCP: London Pepper, MD  Admit date: 12/04/2019 Discharge date: 12/06/2019  Recommendations for Outpatient Follow-up:  1.  (include homehealth, outpatient follow-up instructions, specific recommendations for PCP to follow-up on, etc.)   Follow-up Information    Yovanna Cogan, Arta Bruce, MD. Go on 12/26/2019.   Specialty: General Surgery Why: at 320 pm Contact information: [place] [address] [phone]        Surgery, [place]. Go on 01/30/2020.   Specialty: General Surgery Why: at 9 am Contact information: [place] [address] [phone]              Discharge Diagnoses:  Active Problems:   Morbid obesity ([place])   Surgical Procedure: Roux-en-Y gastric bypass, upper endoscopy  Discharge Condition: Good Disposition: Home  Diet recommendation: Postoperative sleeve gastrectomy diet (liquids only)  Filed Weights   12/04/19 0558  Weight: 112.2 kg     Hospital Course:  The patient was admitted after undergoing Roux-en-Y gastric bypass. POD 0 she ambulated well. POD 1 she was started on the water diet protocol and tolerated 300 ml in the first shift. She had hypertension issues and also significant epigastric pain. Once meeting the water amount she was advanced to bariatric protein shakes which they tolerated and were discharged home POD 2.  Treatments: surgery: Roux-en-Y gastric bypass  Discharge Instructions  Discharge Instructions    Ambulate hourly while awake   Complete by: As directed    Call MD for:  difficulty breathing, headache or visual disturbances   Complete by: As directed    Call MD for:  persistant dizziness or light-headedness   Complete by: As directed    Call MD for:  persistant nausea and vomiting   Complete by: As directed    Call MD for:  redness, tenderness, or signs of infection  (pain, swelling, redness, odor or green/yellow discharge around incision site)   Complete by: As directed    Call MD for:  severe uncontrolled pain   Complete by: As directed    Call MD for:  temperature >101 F   Complete by: As directed    Diet bariatric full liquid   Complete by: As directed    Discharge wound care:   Complete by: As directed    Remove Bandaids tomorrow, ok to shower tomorrow. Steristrips may fall off in 1-3 weeks.   Incentive spirometry   Complete by: As directed    Perform hourly while awake     Allergies as of 12/06/2019      Reactions   Oxycodone Nausea Only   Adhesive [tape] Rash      Medication List    TAKE these medications   acetaminophen 500 MG tablet Commonly known as: TYLENOL Take 2 tablets (1,000 mg total) by mouth every 8 (eight) hours for 5 days.   amLODipine 10 MG tablet Commonly known as: NORVASC Take 10 mg by mouth daily. Notes to patient: Monitor Blood Pressure Daily and keep a log for primary care physician.  You may need to make changes to your medications with rapid weight loss.     betamethasone dipropionate 0.05 % lotion Apply 1 application topically 2 (two) times daily as needed (irritation.).   gabapentin 100 MG capsule Commonly known as: NEURONTIN Take 2 capsules (200 mg total) by mouth every 12 (twelve) hours.   HYDROcodone-acetaminophen 7.5-325 mg/15 ml solution Commonly known as: HYCET  Take 10 mLs by mouth every 4 (four) hours as needed for moderate pain.   multivitamin with minerals Tabs tablet Take 1 tablet by mouth daily. Multivitamin for Women   ondansetron 4 MG disintegrating tablet Commonly known as: ZOFRAN-ODT Take 1 tablet (4 mg total) by mouth every 6 (six) hours as needed for nausea or vomiting.   pantoprazole 40 MG tablet Commonly known as: PROTONIX Take 1 tablet (40 mg total) by mouth daily.            Discharge Care Instructions  (From admission, onward)         Start     Ordered   12/06/19  0000  Discharge wound care:       Comments: Remove Bandaids tomorrow, ok to shower tomorrow. Steristrips may fall off in 1-3 weeks.   12/06/19 0751          Follow-up Information    Terrel Manalo, Arta Bruce, MD. Go on 12/26/2019.   Specialty: General Surgery Why: at 320 pm Contact information: [place] [address] [phone]        Surgery, [place]. Go on 01/30/2020.   Specialty: General Surgery Why: at 9 am Contact information: [place] [address] [phone]                The results of significant diagnostics from this hospitalization (including imaging, microbiology, ancillary and laboratory) are listed below for reference.    Significant Diagnostic Studies: No results found.  Labs: Basic Metabolic Panel: Recent Labs  Lab 11/29/19 0844 12/05/19 0533  NA 135 134*  K 4.1 4.7  CL 101 102  CO2 27 21*  GLUCOSE 89 104*  BUN 13 8  CREATININE 0.71 0.78  CALCIUM 8.7* 8.4*   Liver Function Tests: Recent Labs  Lab 11/29/19 0844 12/05/19 0533  AST 16 45*  ALT 21 48*  ALKPHOS 79 64  BILITOT 0.5 0.6  PROT 7.9 7.3  ALBUMIN 3.2* 2.9*    CBC: Recent Labs  Lab 11/29/19 0844 12/04/19 1132 12/05/19 0533 12/05/19 1309 12/06/19 0501  WBC 14.9*  --  20.9* 19.7* 11.4*  NEUTROABS 9.1*  --  17.5*  --  6.5  HGB 11.8* 12.2 11.9* 12.2 11.2*  HCT 37.0 39.8 37.6 38.7 35.1*  MCV 81.7  --  82.5 81.8 82.2  PLT 398  --  387 427* 360    CBG: No results for input(s): GLUCAP in the last 168 hours.  Active Problems:   Morbid obesity ([place])   VTE plan: no chemical prophylaxis recommended ([email or web address])  Time coordinating discharge: 15 min

## 2019-12-10 ENCOUNTER — Non-Acute Institutional Stay
Admission: RE | Admit: 2019-12-10 | Discharge: 2019-12-10 | Disposition: A | Discharge status: Home / Self Care | Payer: 59 | Source: Ambulatory Visit | Attending: General Surgery | Admitting: General Surgery

## 2019-12-10 ENCOUNTER — Other Ambulatory Visit: Payer: Self-pay | Admitting: General Surgery

## 2019-12-10 ENCOUNTER — Telehealth (HOSPITAL_COMMUNITY): Payer: Self-pay

## 2019-12-10 ENCOUNTER — Other Ambulatory Visit: Payer: Self-pay

## 2019-12-10 DIAGNOSIS — E860 Dehydration: Secondary | ICD-10-CM

## 2019-12-10 LAB — CBC WITH DIFFERENTIAL/PLATELET
Abs Immature Granulocytes: 0.03 10*3/uL (ref 0.00–0.07)
Basophils Absolute: 0 10*3/uL (ref 0.0–0.1)
Basophils Relative: 0 %
Eosinophils Absolute: 0.1 10*3/uL (ref 0.0–0.5)
Eosinophils Relative: 1 %
HCT: 38.4 % (ref 36.0–46.0)
Hemoglobin: 13.3 g/dL (ref 12.0–15.0)
Immature Granulocytes: 0 %
Lymphocytes Relative: 20 %
Lymphs Abs: 2.6 10*3/uL (ref 0.7–4.0)
MCH: 26.8 pg (ref 26.0–34.0)
MCHC: 34.6 g/dL (ref 30.0–36.0)
MCV: 77.3 fL — ABNORMAL LOW (ref 80.0–100.0)
Monocytes Absolute: 1 10*3/uL (ref 0.1–1.0)
Monocytes Relative: 8 %
Neutro Abs: 9.4 10*3/uL — ABNORMAL HIGH (ref 1.7–7.7)
Neutrophils Relative %: 71 %
Platelets: 452 10*3/uL — ABNORMAL HIGH (ref 150–400)
RBC: 4.97 MIL/uL (ref 3.87–5.11)
RDW: 14 % (ref 11.5–15.5)
WBC: 13.2 10*3/uL — ABNORMAL HIGH (ref 4.0–10.5)
nRBC: 0 % (ref 0.0–0.2)

## 2019-12-10 LAB — COMPREHENSIVE METABOLIC PANEL
ALT: 33 U/L (ref 0–44)
AST: 20 U/L (ref 15–41)
Albumin: 3.4 g/dL — ABNORMAL LOW (ref 3.5–5.0)
Alkaline Phosphatase: 72 U/L (ref 38–126)
Anion gap: 13 (ref 5–15)
BUN: 17 mg/dL (ref 6–20)
CO2: 23 mmol/L (ref 22–32)
Calcium: 8.7 mg/dL — ABNORMAL LOW (ref 8.9–10.3)
Chloride: 98 mmol/L (ref 98–111)
Creatinine, Ser: 0.84 mg/dL (ref 0.44–1.00)
GFR calc Af Amer: >60 mL/min (ref >60)
GFR calc non Af Amer: >60 mL/min (ref >60)
Glucose, Bld: 89 mg/dL (ref 70–99)
Potassium: 3.9 mmol/L (ref 3.5–5.1)
Sodium: 134 mmol/L — ABNORMAL LOW (ref 135–145)
Total Bilirubin: 1 mg/dL (ref 0.3–1.2)
Total Protein: 8.5 g/dL — ABNORMAL HIGH (ref 6.5–8.1)

## 2019-12-10 LAB — MAGNESIUM: Magnesium: 2.1 mg/dL (ref 1.7–2.4)

## 2019-12-10 LAB — PHOSPHORUS: Phosphorus: 3 mg/dL (ref 2.5–4.6)

## 2019-12-10 MED ORDER — ONDANSETRON 4 MG PO TBDP: 4.0000 mg | ORAL_TABLET | ORAL | Status: DC | PRN

## 2019-12-10 MED ORDER — ACETAMINOPHEN 160 MG/5ML PO SOLN
325.0000 mg | ORAL | Status: DC | PRN
  Filled 2019-12-10: qty 20.3

## 2019-12-10 MED ORDER — THIAMINE HCL 100 MG/ML IJ SOLN
INTRAVENOUS | Status: DC
  Filled 2019-12-10: qty 1000

## 2019-12-10 MED ORDER — ACETAMINOPHEN 325 MG RE SUPP
325.0000 mg | RECTAL | Status: DC | PRN
  Filled 2019-12-10: qty 2

## 2019-12-10 MED ORDER — SODIUM CHLORIDE 0.9 % IV BOLUS
1000.0000 mL | Freq: Once | INTRAVENOUS | Status: AC
  Administered 2019-12-10: 1000 mL via INTRAVENOUS

## 2019-12-10 MED ORDER — ACETAMINOPHEN 325 MG PO TABS: 325.0000 mg | ORAL_TABLET | ORAL | Status: DC | PRN

## 2019-12-10 MED ORDER — ONDANSETRON HCL 4 MG/2ML IJ SOLN: 4.0000 mg | INTRAMUSCULAR | Status: DC | PRN

## 2019-12-10 NOTE — Telephone Encounter (Addendum)
Patient called to discuss post bariatric surgery follow up questions.  See below:   1.  Tell me about your pain and pain management? Patient rates pain at 7/8 completed medication provided at discharge for pain.  Describes pain as sharp on right side not associated with any incision, takes breath away.  Using heating pad and tylenol.  Position change does not effect pain.  2.  Let's talk about fluid intake.  How much total fluid are you taking in? Reports concerns about fluid intake per patient recall the most she has taken in is 27.5 ozs.  States feels full all the time and can not get additional fluid or protein in.  She does state she is weak, but is not dizzy or lightheaded.  11.5 ozs protein shake, 12.0 ozs Gatorade zero, 2.0ozs  SF popsicle, 2.0 oz  water  3.  How much protein have you taken in the last 2 days?  30 grams of protein.  Discussed utilizing unflavored protein in fluid  4.  Have you had nausea?  Tell me about when have experienced nausea and what you did to help?  Has utilized medication which helps one episode of emesis since discharge  5.  Has the frequency or color changed with your urine?urine is concentrated per patient (description a little darker than apple juice)  6.  Tell me what your incisions look like?incisions intact  7.  Have you been passing gas? BM?no BM since discharge, encouraged patient to start either Miralax or MOM. Stated she had both, plans on MOM this morning.  8.  If a problem or question were to arise who would you call?  Do you know contact numbers for [place], [facility], and [facility]? Aware of how to contact all services  9.  How has the walking going?walking to mailbox, walking around home every hour  10.  How are your vitamins and calcium going?  How are you taking them?started mvi and calcium and taking appropriately.  Patient will need IV hydration, will discuss pain management with physician.  Reached out to Dr Greer Pickerel for management of patient,  Dr Kieth Brightly unavailable.  IV hydration ordered and scheduled at [facility]. Patient aware of appointment.  [facility] triage to call in pain medication per Dr Redmond Pulling instruction.

## 2019-12-10 NOTE — Discharge Instructions (Signed)

## 2019-12-10 NOTE — Progress Notes (Signed)
Pt tolerated IV infusions well. VSS D/C instructions given, verbalized understanding,  D/C'd home via wheelchair accompanied by mother. Pt stated that she continues to have abdominal discomfort. Instructed to take MOM at home.

## 2019-12-12 ENCOUNTER — Telehealth (HOSPITAL_COMMUNITY): Payer: Self-pay

## 2019-12-12 NOTE — Telephone Encounter (Signed)
Follow up with patient post IV hydration.  States she feels better, urine lighter in color and more frequent.  Oral fluid volume recall 42 ounces/30 grams of protein.  Discussed setting timers to help with fluid intake.   Denies pain at this time, had BM yesterday.  Instructed to call [facility] should she not continue to maintain/increase fluids or become symptomatic of dehydration.  Verified patient had contact information.  Reminded to follow up with dietitian as scheduled at discharge.

## 2019-12-17 ENCOUNTER — Other Ambulatory Visit: Payer: Self-pay | Admitting: General Surgery

## 2019-12-17 ENCOUNTER — Telehealth (HOSPITAL_COMMUNITY): Payer: Self-pay

## 2019-12-17 NOTE — Telephone Encounter (Addendum)
Patient called [place] with concerns related to hydration.  Patient recalled fluid intake for last 48 hrs.  First day (Saturday 12/15/2019) she was able to get in 54 ounces including protein 22 ozs, 16 ozs water, 16 ozs broth.  She pureed cauliflower to thin consistency which made her sick since then she says she has difficulty with thin liquids, but thicker fluids like protein work well.  Yesterday (Sunday 12/16/2019) she was only able to tolerate 29 ozs of fluid (11 oz protein, 8 oz broth, 10 oz of water).  She stated she had periods of dizziness with standing x 2 today but those symptoms did not persist and her urine is light in color but not as frequent.  Coached patient on drinking fluids, not to increase diet to pureed. Discussed with  Dr Kieth Brightly.  IV hydration at [facility] scheduled Wednesday 12/19/2019, orders placed, patient made aware of appointment time, date, and place.

## 2019-12-18 ENCOUNTER — Other Ambulatory Visit: Payer: Self-pay

## 2019-12-18 ENCOUNTER — Encounter: Payer: 59 | Attending: General Surgery | Admitting: Skilled Nursing Facility1

## 2019-12-18 DIAGNOSIS — E669 Obesity, unspecified: Secondary | ICD-10-CM | POA: Diagnosis present

## 2019-12-19 ENCOUNTER — Ambulatory Visit
Admission: RE | Admit: 2019-12-19 | Discharge: 2019-12-19 | Disposition: A | Discharge status: Home / Self Care | Payer: 59 | Source: Ambulatory Visit | Attending: General Surgery | Admitting: General Surgery

## 2019-12-19 ENCOUNTER — Other Ambulatory Visit: Payer: Self-pay

## 2019-12-19 DIAGNOSIS — E860 Dehydration: Secondary | ICD-10-CM | POA: Diagnosis present

## 2019-12-19 MED ORDER — ONDANSETRON 4 MG PO TBDP: 4.0000 mg | ORAL_TABLET | ORAL | Status: DC | PRN

## 2019-12-19 MED ORDER — SODIUM CHLORIDE 0.9 % IV BOLUS
1000.0000 mL | Freq: Once | INTRAVENOUS | Status: AC
  Administered 2019-12-19: 1000 mL via INTRAVENOUS

## 2019-12-19 MED ORDER — ONDANSETRON HCL 4 MG/2ML IJ SOLN: 4.0000 mg | INTRAMUSCULAR | Status: DC | PRN

## 2019-12-19 MED ORDER — SODIUM CHLORIDE 0.9 % IV SOLN: INTRAVENOUS | Status: AC

## 2019-12-19 NOTE — OR Nursing (Signed)
Patient discharged at this time with no complaints.  Patient has scheduled f/u on 12/26/2019 with MD.

## 2019-12-19 NOTE — Progress Notes (Signed)
2 Week Post-Operative Nutrition Class   Patient was seen on 06/27/18 for Post-Operative Nutrition education at the [facility].    Pt reports upper abdominal pain when drinking plain water. Pt states protein shakes and Gatorade zero are well tolerated and able to meet her fluid needs (totla body water 42%). Pt states when she sits up straight with her shoulders back she no longer feels that pain: dietitian advised that to be an indication to listen to her body if it feels better to do so. Pt states she ate pureed cauliflower stating without issue: Dietitian advised pt consistancy of food is not the issue, nutrient content is the issue. Dietitian gave explicit instructions to follow the current phase to mitigate risk of GI disturbances.  Pts resources have been tiktok which she is realizing is not what she needs to be following.   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 Start weight at [facility]: 245.5 Weight today: 227.8   Body Composition Scale 12/19/2019  Total Body Fat % 44.9  Visceral Fat 15  Fat-Free Mass % 55   Total Body Water % 42   Muscle-Mass lbs 29.3  Body Fat Displacement          Torso  lbs 63.4         Left Leg  lbs 12.6         Right Leg  lbs 12.6         Left Arm  lbs 6.3         Right Arm   lbs 6.3     The following the learning objectives were met by the patient during this course:  Identifies Phase 3 (Soft, High Proteins) Dietary Goals and will begin from 2 weeks post-operatively to 2 months post-operatively  Identifies appropriate sources of fluids and proteins   States protein recommendations and appropriate sources post-operatively  Identifies the need for appropriate texture modifications, mastication, and bite sizes when consuming solids  Identifies appropriate multivitamin and calcium sources post-operatively  Describes the need for physical activity post-operatively and will follow MD recommendations  States when to call  healthcare provider regarding medication questions or post-operative complications   Handouts given during class include:  Phase 3A: Soft, High Protein Diet Handout   Follow-Up Plan: Patient will follow-up at [facility] in 6 weeks for 2 month post-op nutrition visit for diet advancement per MD.

## 2019-12-24 ENCOUNTER — Telehealth: Payer: Self-pay | Admitting: Skilled Nursing Facility1

## 2019-12-24 NOTE — Telephone Encounter (Signed)
RD called pt to verify fluid intake once starting soft, solid proteins 2 week post-bariatric surgery.   Daily Fluid intake: 50+ Daily Protein intake: 60+  Concerns/issues:   Pt states it went well transitioning to solid foods. Pt states she drinks Gatorade zero. Pt states the alkaline water went well and continues to drink that.

## 2019-12-27 ENCOUNTER — Other Ambulatory Visit: Payer: Self-pay

## 2019-12-27 ENCOUNTER — Ambulatory Visit (HOSPITAL_COMMUNITY)
Admission: RE | Admit: 2019-12-27 | Discharge: 2019-12-27 | Disposition: A | Discharge status: Home / Self Care | Payer: 59 | Source: Ambulatory Visit | Attending: Internal Medicine | Admitting: Internal Medicine

## 2019-12-27 ENCOUNTER — Other Ambulatory Visit: Payer: Self-pay | Admitting: General Surgery

## 2019-12-27 DIAGNOSIS — E860 Dehydration: Secondary | ICD-10-CM | POA: Insufficient documentation

## 2019-12-27 MED ORDER — SODIUM CHLORIDE 0.9 % IV BOLUS
3000.0000 mL | Freq: Once | INTRAVENOUS | Status: AC
  Administered 2019-12-27: 3000 mL via INTRAVENOUS

## 2019-12-27 MED ORDER — ONDANSETRON HCL 4 MG/2ML IJ SOLN
4.0000 mg | INTRAMUSCULAR | Status: DC | PRN
  Administered 2019-12-27: 4 mg via INTRAVENOUS
  Filled 2019-12-27: qty 2

## 2019-12-27 MED ORDER — ONDANSETRON 4 MG PO TBDP: 4.0000 mg | ORAL_TABLET | ORAL | Status: DC | PRN

## 2019-12-27 NOTE — Discharge Instructions (Signed)

## 2019-12-27 NOTE — Progress Notes (Signed)
PATIENT CARE CENTER NOTE    Provider: [name], MD   Procedure: 3 L Fluid Bolus   Note: Patient received 3 Liter bolus of 0.9% Sodium Chloride via PIV. Patient tolerated bolus well with no adverse reaction. IV Zofran given for nausea. Vital signs remained stable. Discharge instructions given. Patient alert, oriented and ambulatory at discharge.

## 2020-01-02 ENCOUNTER — Telehealth (HOSPITAL_COMMUNITY): Payer: Self-pay

## 2020-01-02 NOTE — Telephone Encounter (Signed)
Follow up on fluid intake.  Discussed trying different options for fluid, current intake 60+ ounces to avoid juices.  Tolerating fish and shrimp, does not tolerate chicken.  Reports nausea with Carafate instructed to call [facility] if antiemetic needed not to stop Carafate. Taking Nexium as prescribed.  Questions answered.

## 2020-01-16 ENCOUNTER — Inpatient Hospital Stay (HOSPITAL_COMMUNITY)
Admission: AD | Admit: 2020-01-16 | Discharge: 2020-01-23 | DRG: 641 | Disposition: A | Discharge status: Home / Self Care | Payer: 59 | Source: Ambulatory Visit | Attending: General Surgery | Admitting: General Surgery

## 2020-01-16 ENCOUNTER — Other Ambulatory Visit: Payer: Self-pay | Admitting: General Surgery

## 2020-01-16 ENCOUNTER — Encounter (HOSPITAL_COMMUNITY): Payer: Self-pay

## 2020-01-16 ENCOUNTER — Other Ambulatory Visit: Payer: Self-pay

## 2020-01-16 DIAGNOSIS — K289 Gastrojejunal ulcer, unspecified as acute or chronic, without hemorrhage or perforation: Secondary | ICD-10-CM | POA: Diagnosis present

## 2020-01-16 DIAGNOSIS — Z9884 Bariatric surgery status: Secondary | ICD-10-CM | POA: Diagnosis not present

## 2020-01-16 DIAGNOSIS — Z20822 Contact with and (suspected) exposure to covid-19: Secondary | ICD-10-CM | POA: Diagnosis present

## 2020-01-16 DIAGNOSIS — R1310 Dysphagia, unspecified: Secondary | ICD-10-CM | POA: Diagnosis present

## 2020-01-16 DIAGNOSIS — K30 Functional dyspepsia: Secondary | ICD-10-CM | POA: Diagnosis present

## 2020-01-16 DIAGNOSIS — R197 Diarrhea, unspecified: Secondary | ICD-10-CM | POA: Diagnosis not present

## 2020-01-16 DIAGNOSIS — D72829 Elevated white blood cell count, unspecified: Secondary | ICD-10-CM | POA: Diagnosis present

## 2020-01-16 DIAGNOSIS — E860 Dehydration: Principal | ICD-10-CM | POA: Diagnosis present

## 2020-01-16 DIAGNOSIS — Z79899 Other long term (current) drug therapy: Secondary | ICD-10-CM | POA: Diagnosis not present

## 2020-01-16 DIAGNOSIS — K910 Vomiting following gastrointestinal surgery: Secondary | ICD-10-CM | POA: Diagnosis present

## 2020-01-16 HISTORY — DX: Vomiting following gastrointestinal surgery: K91.0

## 2020-01-16 LAB — COMPREHENSIVE METABOLIC PANEL
ALT: 45 U/L — ABNORMAL HIGH (ref 0–44)
AST: 28 U/L (ref 15–41)
Albumin: 3.3 g/dL — ABNORMAL LOW (ref 3.5–5.0)
Alkaline Phosphatase: 92 U/L (ref 38–126)
Anion gap: 13 (ref 5–15)
BUN: 12 mg/dL (ref 6–20)
CO2: 27 mmol/L (ref 22–32)
Calcium: 9.1 mg/dL (ref 8.9–10.3)
Chloride: 100 mmol/L (ref 98–111)
Creatinine, Ser: 0.79 mg/dL (ref 0.44–1.00)
GFR calc Af Amer: >60 mL/min (ref >60)
GFR calc non Af Amer: >60 mL/min (ref >60)
Glucose, Bld: 100 mg/dL — ABNORMAL HIGH (ref 70–99)
Potassium: 3.6 mmol/L (ref 3.5–5.1)
Sodium: 140 mmol/L (ref 135–145)
Total Bilirubin: 0.6 mg/dL (ref 0.3–1.2)
Total Protein: 8.4 g/dL — ABNORMAL HIGH (ref 6.5–8.1)

## 2020-01-16 LAB — CBC WITH DIFFERENTIAL/PLATELET
Abs Immature Granulocytes: 0.06 10*3/uL (ref 0.00–0.07)
Basophils Absolute: 0.1 10*3/uL (ref 0.0–0.1)
Basophils Relative: 0 %
Eosinophils Absolute: 0.2 10*3/uL (ref 0.0–0.5)
Eosinophils Relative: 1 %
HCT: 41.9 % (ref 36.0–46.0)
Hemoglobin: 13.4 g/dL (ref 12.0–15.0)
Immature Granulocytes: 1 %
Lymphocytes Relative: 26 %
Lymphs Abs: 3.3 10*3/uL (ref 0.7–4.0)
MCH: 25.9 pg — ABNORMAL LOW (ref 26.0–34.0)
MCHC: 32 g/dL (ref 30.0–36.0)
MCV: 81 fL (ref 80.0–100.0)
Monocytes Absolute: 0.9 10*3/uL (ref 0.1–1.0)
Monocytes Relative: 7 %
Neutro Abs: 8.2 10*3/uL — ABNORMAL HIGH (ref 1.7–7.7)
Neutrophils Relative %: 65 %
Platelets: 364 10*3/uL (ref 150–400)
RBC: 5.17 MIL/uL — ABNORMAL HIGH (ref 3.87–5.11)
RDW: 15.4 % (ref 11.5–15.5)
WBC: 12.6 10*3/uL — ABNORMAL HIGH (ref 4.0–10.5)
nRBC: 0 % (ref 0.0–0.2)

## 2020-01-16 LAB — MAGNESIUM: Magnesium: 2.1 mg/dL (ref 1.7–2.4)

## 2020-01-16 LAB — PHOSPHORUS: Phosphorus: 3.4 mg/dL (ref 2.5–4.6)

## 2020-01-16 MED ORDER — SUCRALFATE 1 GM/10ML PO SUSP
1.0000 g | Freq: Three times a day (TID) | ORAL | Status: DC
  Administered 2020-01-17 – 2020-01-23 (×21): 1 g via ORAL
  Filled 2020-01-16 (×23): qty 10

## 2020-01-16 MED ORDER — ENOXAPARIN SODIUM 40 MG/0.4ML ~~LOC~~ SOLN
40.0000 mg | SUBCUTANEOUS | Status: DC
  Administered 2020-01-16 – 2020-01-22 (×7): 40 mg via SUBCUTANEOUS
  Filled 2020-01-16 (×7): qty 0.4

## 2020-01-16 MED ORDER — ONDANSETRON HCL 4 MG/2ML IJ SOLN
4.0000 mg | Freq: Four times a day (QID) | INTRAMUSCULAR | Status: DC | PRN
  Administered 2020-01-16 – 2020-01-23 (×12): 4 mg via INTRAVENOUS
  Filled 2020-01-16 (×14): qty 2

## 2020-01-16 MED ORDER — SODIUM CHLORIDE 0.9 % IV BOLUS: 1000.0000 mL | Freq: Once | INTRAVENOUS | Status: AC

## 2020-01-16 MED ORDER — ACETAMINOPHEN 325 MG PO TABS
650.0000 mg | ORAL_TABLET | Freq: Four times a day (QID) | ORAL | Status: DC | PRN
  Administered 2020-01-20 – 2020-01-22 (×2): 650 mg via ORAL
  Filled 2020-01-16 (×2): qty 2

## 2020-01-16 MED ORDER — ACETAMINOPHEN 650 MG RE SUPP: 650.0000 mg | Freq: Four times a day (QID) | RECTAL | Status: DC | PRN

## 2020-01-16 MED ORDER — DEXTROSE-NACL 5-0.45 % IV SOLN: INTRAVENOUS | Status: DC

## 2020-01-16 MED ORDER — ENSURE MAX PROTEIN PO LIQD
2.0000 [oz_av] | ORAL | Status: DC
  Administered 2020-01-20: 2 [oz_av] via ORAL
  Filled 2020-01-16 (×31): qty 330

## 2020-01-16 MED ORDER — OXYCODONE HCL 5 MG PO TABS: 5.0000 mg | ORAL_TABLET | ORAL | Status: DC | PRN

## 2020-01-16 MED ORDER — MORPHINE SULFATE (PF) 2 MG/ML IV SOLN
2.0000 mg | INTRAVENOUS | Status: DC | PRN
  Administered 2020-01-19 – 2020-01-23 (×10): 2 mg via INTRAVENOUS
  Filled 2020-01-16 (×13): qty 1

## 2020-01-16 MED ORDER — METOPROLOL TARTRATE 5 MG/5ML IV SOLN
5.0000 mg | Freq: Four times a day (QID) | INTRAVENOUS | Status: DC | PRN
  Administered 2020-01-19 (×2): 5 mg via INTRAVENOUS
  Filled 2020-01-16 (×2): qty 5

## 2020-01-16 MED ORDER — ONDANSETRON 4 MG PO TBDP: 4.0000 mg | ORAL_TABLET | Freq: Four times a day (QID) | ORAL | Status: DC | PRN

## 2020-01-16 MED ORDER — PANTOPRAZOLE SODIUM 40 MG IV SOLR
40.0000 mg | Freq: Every day | INTRAVENOUS | Status: DC
  Administered 2020-01-16 – 2020-01-17 (×2): 40 mg via INTRAVENOUS
  Filled 2020-01-16 (×2): qty 40

## 2020-01-17 ENCOUNTER — Inpatient Hospital Stay (HOSPITAL_COMMUNITY): Payer: 59

## 2020-01-17 LAB — CBC
HCT: 36.1 % (ref 36.0–46.0)
Hemoglobin: 11.3 g/dL — ABNORMAL LOW (ref 12.0–15.0)
MCH: 26.1 pg (ref 26.0–34.0)
MCHC: 31.3 g/dL (ref 30.0–36.0)
MCV: 83.4 fL (ref 80.0–100.0)
Platelets: 306 10*3/uL (ref 150–400)
RBC: 4.33 MIL/uL (ref 3.87–5.11)
RDW: 15.4 % (ref 11.5–15.5)
WBC: 10.4 10*3/uL (ref 4.0–10.5)
nRBC: 0 % (ref 0.0–0.2)

## 2020-01-17 LAB — GLUCOSE, CAPILLARY
Glucose-Capillary: 106 mg/dL — ABNORMAL HIGH (ref 70–99)
Glucose-Capillary: 110 mg/dL — ABNORMAL HIGH (ref 70–99)
Glucose-Capillary: 117 mg/dL — ABNORMAL HIGH (ref 70–99)

## 2020-01-17 LAB — BASIC METABOLIC PANEL
Anion gap: 9 (ref 5–15)
BUN: 10 mg/dL (ref 6–20)
CO2: 25 mmol/L (ref 22–32)
Calcium: 7.8 mg/dL — ABNORMAL LOW (ref 8.9–10.3)
Chloride: 100 mmol/L (ref 98–111)
Creatinine, Ser: 0.77 mg/dL (ref 0.44–1.00)
GFR calc Af Amer: >60 mL/min (ref >60)
GFR calc non Af Amer: >60 mL/min (ref >60)
Glucose, Bld: 269 mg/dL — ABNORMAL HIGH (ref 70–99)
Potassium: 2.9 mmol/L — ABNORMAL LOW (ref 3.5–5.1)
Sodium: 134 mmol/L — ABNORMAL LOW (ref 135–145)

## 2020-01-17 MED ORDER — ERYTHROMYCIN BASE 250 MG PO TABS
250.0000 mg | ORAL_TABLET | Freq: Four times a day (QID) | ORAL | Status: DC
  Administered 2020-01-17 – 2020-01-23 (×24): 250 mg via ORAL
  Filled 2020-01-17 (×27): qty 1

## 2020-01-17 MED ORDER — IOHEXOL 300 MG/ML  SOLN: 150.0000 mL | Freq: Once | INTRAMUSCULAR | Status: DC | PRN

## 2020-01-17 MED ORDER — SODIUM CHLORIDE 0.9 % IV SOLN
INTRAVENOUS | Status: DC | PRN
  Administered 2020-01-17: 250 mL via INTRAVENOUS

## 2020-01-17 MED ORDER — POTASSIUM CHLORIDE 10 MEQ/100ML IV SOLN
10.0000 meq | INTRAVENOUS | Status: AC
  Administered 2020-01-17 (×3): 10 meq via INTRAVENOUS
  Filled 2020-01-17 (×3): qty 100

## 2020-01-17 MED ORDER — METOCLOPRAMIDE HCL 5 MG/ML IJ SOLN
10.0000 mg | Freq: Three times a day (TID) | INTRAMUSCULAR | Status: DC
  Administered 2020-01-17 – 2020-01-18 (×3): 10 mg via INTRAVENOUS
  Filled 2020-01-17 (×3): qty 2

## 2020-01-17 NOTE — Progress Notes (Signed)
Patient leaving for radiology study, denies nausea and pain with current medications.  Will continue to follow.

## 2020-01-17 NOTE — Progress Notes (Signed)
Progress Note: General Surgery Service   Chief Complaint/Subjective: Nausea, no pain, tolerated some ice  Objective: Vital signs in last 24 hours: Temp:  [97.7 F (36.5 C)-98.2 F (36.8 C)] 97.7 F (36.5 C) (09/16 0529) Pulse Rate:  [74-84] 74 (09/16 0920) Resp:  [14-18] 18 (09/16 0920) BP: (132-151)/(91-110) 132/101 (09/16 0920) SpO2:  [97 %-100 %] 98 % (09/16 0920) Weight:  [112.2 kg] 112.2 kg (09/15 1755) Last BM Date: 01/13/20  Intake/Output from previous day: 09/15 0701 - 09/16 0700 In: 524.7 [I.V.:524.7] Out: -  Intake/Output this shift: No intake/output data recorded.  Gen: NAD  Resp: nonlabored  Card: RRR  Abd: soft, NT< ND  Lab Results: CBC  Recent Labs    01/16/20 1756 01/17/20 0323  WBC 12.6* 10.4  HGB 13.4 11.3*  HCT 41.9 36.1  PLT 364 306   BMET Recent Labs    01/16/20 1756 01/17/20 0323  NA 140 134*  K 3.6 2.9*  CL 100 100  CO2 27 25  GLUCOSE 100* 269*  BUN 12 10  CREATININE 0.79 0.77  CALCIUM 9.1 7.8*   PT/INR No results for input(s): LABPROT, INR in the last 72 hours. ABG No results for input(s): PHART, HCO3 in the last 72 hours.  Invalid input(s): PCO2, PO2  Anti-infectives: Anti-infectives (From admission, onward)   Start     Dose/Rate Route Frequency Ordered Stop   01/17/20 1345  erythromycin (E-MYCIN) tablet 250 mg        250 mg Oral Every 6 hours 01/17/20 1343        Medications: Scheduled Meds: . enoxaparin (LOVENOX) injection  40 mg Subcutaneous Q24H  . erythromycin  250 mg Oral Q6H  . metoCLOPramide (REGLAN) injection  10 mg Intravenous Q8H  . pantoprazole (PROTONIX) IV  40 mg Intravenous QHS  . Ensure Max Protein  2 oz Oral Q4H  . sucralfate  1 g Oral TID WC & HS   Continuous Infusions: . dextrose 5 % and 0.45% NaCl 100 mL/hr at 01/17/20 1625   PRN Meds:.acetaminophen **OR** acetaminophen, iohexol, metoprolol tartrate, morphine injection, ondansetron **OR** ondansetron (ZOFRAN) IV,  oxyCODONE  Assessment/Plan:  44 yo female s/p gastric bypass with persistent nausea, vomiting, inability to tolerate foods. She will go for UGI this morning. Continue liquids afterwards. Ambulate   LOS: 1 day   Mickeal Skinner, MD [facility]

## 2020-01-17 NOTE — H&P (Addendum)
History of Present Illness Geoffery Spruce MD; 01/16/2020 12:43 PM) The patient is a 44 year old female presenting status-post bariatric surgery.She was doing better but in the last 5 days has had more retching and inability to tolerate water. 3 days ago she became very dizzy while walking. She has difficulty knowing when she needs to urinate. She is drinking 10 oz of water a day.   Allergies Darden Palmer, [place]; 01/16/2020 8:49 AM) No Known Drug Allergies  [01/19/2019]: Allergies Reconciled   Medication History Darden Palmer, [place]; 01/16/2020 8:50 AM) Esomeprazole Magnesium (20MG  Capsule DR, Oral) Active. Sucralfate (1GM Tablet, Oral) Active. Multivitamin Adult (Oral) Active. [facility] Esomeprazole Magnesium (20MG  Capsule DR, 1 (one) Oral two times daily, Taken starting 12/26/2019) Active. Sucralfate (1GM/10ML Suspension, 10 Oral four times daily, Taken starting 12/26/2019) Active. Medications Reconciled    Review of Systems Geoffery Spruce MD; 01/16/2020 12:44 PM) General Not Present- Appetite Loss, Chills, Fatigue, Fever, Night Sweats, Weight Gain and Weight Loss. Skin Not Present- Change in Wart/Mole, Dryness, Hives, Jaundice, New Lesions, Non-Healing Wounds, Rash and Ulcer. HEENT Not Present- Earache, Hearing Loss, Hoarseness, Nose Bleed, Oral Ulcers, Ringing in the Ears, Seasonal Allergies, Sinus Pain, Sore Throat, Visual Disturbances, Wears glasses/contact lenses and Yellow Eyes. Respiratory Not Present- Bloody sputum, Chronic Cough, Difficulty Breathing, Snoring and Wheezing. Breast Not Present- Breast Mass, Breast Pain, Nipple Discharge and Skin Changes. Cardiovascular Not Present- Chest Pain, Difficulty Breathing Lying Down, Leg Cramps, Palpitations, Rapid Heart Rate, Shortness of Breath and Swelling of Extremities. Gastrointestinal Not Present- Abdominal Pain, Bloating, Bloody Stool, Change in Bowel Habits, Chronic diarrhea, Constipation, Difficulty Swallowing, Excessive gas,  Gets full quickly at meals, Hemorrhoids, Indigestion, Nausea, Rectal Pain and Vomiting. Female Genitourinary Not Present- Frequency, Nocturia, Painful Urination, Pelvic Pain and Urgency. Musculoskeletal Not Present- Back Pain, Joint Pain, Joint Stiffness, Muscle Pain, Muscle Weakness and Swelling of Extremities. Neurological Not Present- Decreased Memory, Fainting, Headaches, Numbness, Seizures, Tingling, Tremor, Trouble walking and Weakness. Psychiatric Not Present- Anxiety, Bipolar, Change in Sleep Pattern, Depression, Fearful and Frequent crying. Endocrine Not Present- Cold Intolerance, Excessive Hunger, Hair Changes, Heat Intolerance, Hot flashes and New Diabetes. Hematology Not Present- Blood Thinners, Easy Bruising, Excessive bleeding, Gland problems, HIV and Persistent Infections.  Vitals Lattie Haw [place] RMA; 01/16/2020 8:50 AM) 01/16/2020 8:50 AM Weight: 213.5 lb Height: 61in Body Surface Area: 1.94 m Body Mass Index: 40.34 kg/m  Temp.: 97.50F  Pulse: 97 (Regular)  BP: 142/86(Sitting, Left Arm, Standard)       Physical Exam Geoffery Spruce, MD; 01/16/2020 12:45 PM) General Mental Status-Alert. General Appearance-Consistent with stated age. Hydration-Well hydrated. Voice-Normal.  Chest and Lung Exam Chest and lung exam reveals -quiet, even and easy respiratory effort with no use of accessory muscles. Inspection Chest Wall - Normal. Back - normal.  Abdomen Inspection Incisional scars - Laparoscopy scars. Palpation/Percussion Palpation and Percussion of the abdomen reveal - Soft, Non Tender, No Rebound tenderness and No Rigidity (guarding).    Assessment & Plan Geoffery Spruce MD; 01/16/2020 12:44 PM) OBESITY (E66.9) Impression: 44 yo female with continued difficulty with tolerating liquids. She had improved some with PPI and carafate but now it has worsened again. Admit to [facility] surgical floor for rehydration. UGI today to evaluate. May require  endoscopy that this week. 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

## 2020-01-18 ENCOUNTER — Inpatient Hospital Stay (HOSPITAL_COMMUNITY): Payer: 59 | Admitting: Certified Registered"

## 2020-01-18 ENCOUNTER — Encounter (HOSPITAL_COMMUNITY)
Admission: AD | Disposition: A | Discharge status: Home / Self Care | Payer: Self-pay | Source: Ambulatory Visit | Attending: General Surgery

## 2020-01-18 HISTORY — PX: ESOPHAGOGASTRODUODENOSCOPY (EGD) WITH PROPOFOL: SHX5813

## 2020-01-18 HISTORY — PX: BIOPSY: SHX5522

## 2020-01-18 LAB — BASIC METABOLIC PANEL
Anion gap: 7 (ref 5–15)
BUN: 5 mg/dL — ABNORMAL LOW (ref 6–20)
CO2: 26 mmol/L (ref 22–32)
Calcium: 8 mg/dL — ABNORMAL LOW (ref 8.9–10.3)
Chloride: 103 mmol/L (ref 98–111)
Creatinine, Ser: 0.67 mg/dL (ref 0.44–1.00)
GFR calc Af Amer: >60 mL/min (ref >60)
GFR calc non Af Amer: >60 mL/min (ref >60)
Glucose, Bld: 117 mg/dL — ABNORMAL HIGH (ref 70–99)
Potassium: 3.3 mmol/L — ABNORMAL LOW (ref 3.5–5.1)
Sodium: 136 mmol/L (ref 135–145)

## 2020-01-18 LAB — CBC
HCT: 36.8 % (ref 36.0–46.0)
Hemoglobin: 11.7 g/dL — ABNORMAL LOW (ref 12.0–15.0)
MCH: 26.1 pg (ref 26.0–34.0)
MCHC: 31.8 g/dL (ref 30.0–36.0)
MCV: 82.1 fL (ref 80.0–100.0)
Platelets: 309 10*3/uL (ref 150–400)
RBC: 4.48 MIL/uL (ref 3.87–5.11)
RDW: 15.2 % (ref 11.5–15.5)
WBC: 8.9 10*3/uL (ref 4.0–10.5)
nRBC: 0 % (ref 0.0–0.2)

## 2020-01-18 LAB — GLUCOSE, CAPILLARY
Glucose-Capillary: 144 mg/dL — ABNORMAL HIGH (ref 70–99)
Glucose-Capillary: 135 mg/dL — ABNORMAL HIGH (ref 70–99)
Glucose-Capillary: 114 mg/dL — ABNORMAL HIGH (ref 70–99)
Glucose-Capillary: 163 mg/dL — ABNORMAL HIGH (ref 70–99)

## 2020-01-18 LAB — SARS CORONAVIRUS 2 BY RT PCR (HOSPITAL ORDER, PERFORMED IN ~~LOC~~ HOSPITAL LAB): SARS Coronavirus 2: NEGATIVE

## 2020-01-18 SURGERY — ESOPHAGOGASTRODUODENOSCOPY (EGD) WITH PROPOFOL
Anesthesia: General

## 2020-01-18 MED ORDER — MIDAZOLAM HCL 2 MG/2ML IJ SOLN
INTRAMUSCULAR | Status: AC
  Filled 2020-01-18: qty 2

## 2020-01-18 MED ORDER — POTASSIUM CHLORIDE 10 MEQ/100ML IV SOLN
10.0000 meq | INTRAVENOUS | Status: AC
  Administered 2020-01-18 (×2): 10 meq via INTRAVENOUS
  Filled 2020-01-18: qty 100

## 2020-01-18 MED ORDER — SUCRALFATE 1 GM/10ML PO SUSP: 1.0000 g | Freq: Three times a day (TID) | ORAL | 0 refills | Status: DC

## 2020-01-18 MED ORDER — METOPROLOL TARTRATE 5 MG/5ML IV SOLN
INTRAVENOUS | Status: AC
  Administered 2020-01-18: 5 mg via INTRAVENOUS
  Filled 2020-01-18: qty 5

## 2020-01-18 MED ORDER — LACTATED RINGERS IV SOLN: INTRAVENOUS | Status: DC | PRN

## 2020-01-18 MED ORDER — LIDOCAINE 2% (20 MG/ML) 5 ML SYRINGE
INTRAMUSCULAR | Status: DC | PRN
  Administered 2020-01-18: 20 mg via INTRAVENOUS

## 2020-01-18 MED ORDER — MIDAZOLAM HCL 2 MG/2ML IJ SOLN
INTRAMUSCULAR | Status: DC | PRN
  Administered 2020-01-18: 2 mg via INTRAVENOUS

## 2020-01-18 MED ORDER — PROPOFOL 10 MG/ML IV BOLUS
INTRAVENOUS | Status: DC | PRN
  Administered 2020-01-18: 200 mg via INTRAVENOUS

## 2020-01-18 MED ORDER — DEXAMETHASONE SODIUM PHOSPHATE 10 MG/ML IJ SOLN
INTRAMUSCULAR | Status: DC | PRN
  Administered 2020-01-18: 10 mg via INTRAVENOUS

## 2020-01-18 MED ORDER — FENTANYL CITRATE (PF) 100 MCG/2ML IJ SOLN
INTRAMUSCULAR | Status: AC
  Filled 2020-01-18: qty 2

## 2020-01-18 MED ORDER — LABETALOL HCL 5 MG/ML IV SOLN
INTRAVENOUS | Status: AC
  Filled 2020-01-18: qty 4

## 2020-01-18 MED ORDER — MISOPROSTOL 200 MCG PO TABS
200.0000 ug | ORAL_TABLET | Freq: Four times a day (QID) | ORAL | Status: DC
  Administered 2020-01-18 – 2020-01-23 (×19): 200 ug via ORAL
  Filled 2020-01-18 (×22): qty 1

## 2020-01-18 MED ORDER — FENTANYL CITRATE (PF) 100 MCG/2ML IJ SOLN
INTRAMUSCULAR | Status: DC | PRN
Start: 2020-01-18 — End: 2020-01-18
  Administered 2020-01-18: 50 ug via INTRAVENOUS

## 2020-01-18 MED ORDER — LABETALOL HCL 5 MG/ML IV SOLN
5.0000 mg | INTRAVENOUS | Status: AC | PRN
  Administered 2020-01-18 – 2020-01-20 (×4): 5 mg via INTRAVENOUS
  Filled 2020-01-18: qty 4

## 2020-01-18 MED ORDER — ONDANSETRON HCL 4 MG/2ML IJ SOLN
INTRAMUSCULAR | Status: DC | PRN
  Administered 2020-01-18: 4 mg via INTRAVENOUS

## 2020-01-18 MED ORDER — PANTOPRAZOLE SODIUM 40 MG PO TBEC: 40.0000 mg | DELAYED_RELEASE_TABLET | Freq: Two times a day (BID) | ORAL | 0 refills | Status: DC

## 2020-01-18 MED ORDER — PANTOPRAZOLE SODIUM 40 MG IV SOLR
40.0000 mg | Freq: Two times a day (BID) | INTRAVENOUS | Status: DC
  Administered 2020-01-18 – 2020-01-23 (×10): 40 mg via INTRAVENOUS
  Filled 2020-01-18 (×10): qty 40

## 2020-01-18 MED ORDER — LACTATED RINGERS IV SOLN
INTRAVENOUS | Status: AC | PRN
  Administered 2020-01-18: 1000 mL via INTRAVENOUS

## 2020-01-18 MED ORDER — MISOPROSTOL 200 MCG PO TABS: 200.0000 ug | ORAL_TABLET | Freq: Four times a day (QID) | ORAL | 0 refills | Status: DC

## 2020-01-18 MED ORDER — SUCCINYLCHOLINE CHLORIDE 20 MG/ML IJ SOLN
INTRAMUSCULAR | Status: DC | PRN
  Administered 2020-01-18: 140 mg via INTRAVENOUS

## 2020-01-18 MED ORDER — ONDANSETRON 4 MG PO TBDP: 4.0000 mg | ORAL_TABLET | Freq: Four times a day (QID) | ORAL | 0 refills | Status: DC | PRN

## 2020-01-18 NOTE — Progress Notes (Signed)
Endoscopy performed confirming marginal ulcer. Increase PPI to BID. Screen h. Pylori. Bariatric full liquids

## 2020-01-18 NOTE — Progress Notes (Signed)
In to see patient, questions answered regarding procedure today and exam yesterday.  Discussed current medications.  Had loose BM's and emesis this morning.  This has dissipated by this  afternoon.  Will continue to follow remotely this weekend.   Kelby Fam, Bariatric Nurse Coordinator [phone]

## 2020-01-18 NOTE — Anesthesia Procedure Notes (Signed)
Procedure Name: Intubation Date/Time: 01/18/2020 3:11 PM Performed by: Cynda Familia, CRNA Pre-anesthesia Checklist: Patient identified, Emergency Drugs available, Suction available and Patient being monitored Patient Re-evaluated:Patient Re-evaluated prior to induction Oxygen Delivery Method: Circle System Utilized Preoxygenation: Pre-oxygenation with 100% oxygen Induction Type: IV induction Ventilation: Mask ventilation without difficulty Laryngoscope Size: 2 and Miller Grade View: Grade I Tube type: Oral Tube size: 7.0 mm Number of attempts: 1 Airway Equipment and Method: Stylet Placement Confirmation: ETT inserted through vocal cords under direct vision,  positive ETCO2 and breath sounds checked- equal and bilateral Secured at: 21 cm Tube secured with: Tape Dental Injury: Teeth and Oropharynx as per pre-operative assessment  Comments: Smooth IV induction Glennon Mac-- intubation AM CRNA atraumatic-- teeth and mouth as preop-- bilat BS Lippe- pt reports loose middle lower teeth and upper left back -unchanged with laryngoscopy

## 2020-01-18 NOTE — Anesthesia Postprocedure Evaluation (Signed)
Anesthesia Post Note  Patient: Madison [name]  Procedure(s) Performed: ESOPHAGOGASTRODUODENOSCOPY (EGD) WITH PROPOFOL (N/A ) BIOPSY     Patient location during evaluation: Endoscopy Anesthesia Type: General Level of consciousness: awake and alert, patient cooperative and oriented Pain management: pain level controlled Vital Signs Assessment: post-procedure vital signs reviewed and stable Respiratory status: spontaneous breathing, nonlabored ventilation and respiratory function stable Cardiovascular status: blood pressure returned to baseline and stable Postop Assessment: no apparent nausea or vomiting Anesthetic complications: no   No complications documented.  Last Vitals:  Vitals:   01/18/20 1620 01/18/20 1624  BP: (!) 157/100 (!) 147/97  Pulse: 75 74  Resp: 18 13  Temp:    SpO2: 96% 96%    Last Pain:  Vitals:   01/18/20 1600  TempSrc:   PainSc: 0-No pain                 Corson,E. [place]ara Acklin

## 2020-01-18 NOTE — Progress Notes (Signed)
Progress Note: General Surgery Service   Chief Complaint/Subjective: Tolerated some water overnight, diarrhea this morning with vomiting at the same time. Ambulating  Objective: Vital signs in last 24 hours: Temp:  [98.1 F (36.7 C)] 98.1 F (36.7 C) (09/17 0655) Pulse Rate:  [67-81] 81 (09/17 0655) Resp:  [18] 18 (09/17 0655) BP: (132-151)/(91-107) 141/107 (09/17 0655) SpO2:  [94 %-98 %] 94 % (09/17 0655) Last BM Date: 01/13/20  Intake/Output from previous day: 09/16 0701 - 09/17 0700 In: 3282.9 [P.O.:240; I.V.:2755.1; IV Piggyback:287.8] Out: 500 [Urine:500] Intake/Output this shift: No intake/output data recorded.  Gen: NAD  Resp: nonlabored  Card: RRR  Abd: soft, NT, ND  Lab Results: CBC  Recent Labs    01/17/20 0323 01/18/20 0310  WBC 10.4 8.9  HGB 11.3* 11.7*  HCT 36.1 36.8  PLT 306 309   BMET Recent Labs    01/17/20 0323 01/18/20 0310  NA 134* 136  K 2.9* 3.3*  CL 100 103  CO2 25 26  GLUCOSE 269* 117*  BUN 10 5*  CREATININE 0.77 0.67  CALCIUM 7.8* 8.0*   PT/INR No results for input(s): LABPROT, INR in the last 72 hours. ABG No results for input(s): PHART, HCO3 in the last 72 hours.  Invalid input(s): PCO2, PO2  Anti-infectives: Anti-infectives (From admission, onward)   Start     Dose/Rate Route Frequency Ordered Stop   01/17/20 1345  erythromycin (E-MYCIN) tablet 250 mg        250 mg Oral Every 6 hours 01/17/20 1343        Medications: Scheduled Meds: . enoxaparin (LOVENOX) injection  40 mg Subcutaneous Q24H  . erythromycin  250 mg Oral Q6H  . metoCLOPramide (REGLAN) injection  10 mg Intravenous Q8H  . pantoprazole (PROTONIX) IV  40 mg Intravenous QHS  . Ensure Max Protein  2 oz Oral Q4H  . sucralfate  1 g Oral TID WC & HS   Continuous Infusions: . sodium chloride 250 mL (01/17/20 1718)  . 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

## 2020-01-18 NOTE — Transfer of Care (Signed)
Immediate Anesthesia Transfer of Care Note  Patient: Madison [name]  Procedure(s) Performed: ESOPHAGOGASTRODUODENOSCOPY (EGD) WITH PROPOFOL (N/A ) BIOPSY  Patient Location: PACU  Anesthesia Type:General  Level of Consciousness: awake and alert   Airway & Oxygen Therapy: Patient Spontanous Breathing and Patient connected to face mask oxygen  Post-op Assessment: Report given to RN and Post -op Vital signs reviewed and stable  Post vital signs: Reviewed and stable  Last Vitals:  Vitals Value Taken Time  BP    Temp    Pulse    Resp    SpO2      Last Pain:  Vitals:   01/18/20 1434  TempSrc: Oral  PainSc: 0-No pain         Complications: No complications documented.

## 2020-01-18 NOTE — Op Note (Signed)
[facility] Patient Name: Madison [name] Procedure Date: 01/18/2020 MRN: [id number] Attending MD: Mickeal Skinner MD, MD Date of Birth: October 14, 1975 CSN: [id number] Age: 44 Admit Type: Outpatient Procedure:                Upper GI endoscopy Indications:              Indigestion, Dysphagia Providers:                Arta Bruce Honour Schwieger MD, MD, Cleda Daub, RN,                            Ladona Ridgel, Technician, Glenis Smoker, CRNA Referring MD:              Medicines:                Monitored Anesthesia Care Complications:            No immediate complications. Estimated Blood Loss:     Estimated blood loss was minimal. Procedure:                Pre-Anesthesia Assessment:                           - Prior to the procedure, a History and Physical                            was performed, and patient medications and                            allergies were reviewed. The patient is competent.                            The risks and benefits of the procedure and the                            sedation options and risks were discussed with the                            patient. All questions were answered and informed                            consent was obtained. Patient identification and                            proposed procedure were verified by the physician,                            the nurse, the anesthesiologist and the anesthetist                            in the endoscopy suite. Mental Status Examination:                            normal. Airway Examination: normal oropharyngeal  airway and neck mobility. Respiratory Examination:                            clear to auscultation. CV Examination: normal.                            Prophylactic Antibiotics: The patient does not                            require prophylactic antibiotics. Prior                            Anticoagulants: The patient has taken no previous                             anticoagulant or antiplatelet agents. ASA Grade                            Assessment: III - A patient with severe systemic                            disease. After reviewing the risks and benefits,                            the patient was deemed in satisfactory condition to                            undergo the procedure. The anesthesia plan was to                            use general anesthesia. Immediately prior to                            administration of medications, the patient was                            re-assessed for adequacy to receive sedatives. The                            heart rate, respiratory rate, oxygen saturations,                            blood pressure, adequacy of pulmonary ventilation,                            and response to care were monitored throughout the                            procedure. The physical status of the patient was                            re-assessed after the procedure.  After obtaining informed consent, the endoscope was                            passed under direct vision. Throughout the                            procedure, the patient's blood pressure, pulse, and                            oxygen saturations were monitored continuously. The                            GIF-H190 ([id number]) Olympus gastroscope was                            introduced through the mouth, and advanced to the                            afferent and efferent jejunal loops. The upper GI                            endoscopy was accomplished without difficulty. The                            patient tolerated the procedure well. Scope In: Scope Out: Findings:      The examined esophagus was normal.      The gastroesophageal junction was normal.      Two non-bleeding flat patchy ulcers with no stigmata of bleeding were       found distal to the gastrojejunal anastomosis. The largest lesion was  15       mm in largest dimension. This was biopsied with a cold forceps for       histology. removal of 2 staples in location of smaller ulcer Impression:               - Normal esophagus.                           - Normal gastroesophageal junction.                           - Non-bleeding jejunal ulcers with no stigmata of                            bleeding Biopsied. Moderate Sedation:      Moderate (conscious) sedation was administered by the endoscopy nurse       and supervised by the endoscopist. The patient's oxygen saturation,       heart rate, blood pressure and response to care were monitored. Total       physician intraservice time was 12 minutes. Recommendation:           - Return patient to hospital ward for ongoing care.                           - Full liquid diet today.                           -  Use Protonix (pantoprazole) 40 mg PO BID. 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 All rights reserved. The codes documented in this report are preliminary and upon coder review may  be revised to meet current compliance requirements.  [name], MD Arta Bruce Shabrea Weldin MD, MD 01/18/2020 3:46:46 PM This report has been signed electronically. Number of Addenda: 0

## 2020-01-18 NOTE — Anesthesia Preprocedure Evaluation (Addendum)
Anesthesia Evaluation  Patient identified by MRN, date of birth, ID band Patient awake    Reviewed: Allergy & Precautions, NPO status , Patient's Chart, lab work & pertinent test results  History of Anesthesia Complications (+) PONV  Airway Mallampati: I  TM Distance: >3 FB Neck ROM: Full    Dental  (+) Dental Advisory Given, Loose, Poor Dentition, Missing   Pulmonary former smoker,  01/18/2020 SARS coronavirus NEG   breath sounds clear to auscultation       Cardiovascular hypertension, Pt. on medications (-) angina Rhythm:Regular Rate:Normal     Neuro/Psych Back pain    GI/Hepatic Neg liver ROS, GERD  Poorly Controlled,N/V   Endo/Other  Morbid obesity  Renal/GU negative Renal ROS     Musculoskeletal   Abdominal (+) + obese,   Peds  Hematology negative hematology ROS (+)   Anesthesia Other Findings   Reproductive/Obstetrics                            Anesthesia Physical Anesthesia Plan  ASA: III  Anesthesia Plan: General   Post-op Pain Management:    Induction: Intravenous  PONV Risk Score and Plan: 4 or greater and Ondansetron, Dexamethasone and Midazolam  Airway Management Planned: Oral ETT  Additional Equipment: None  Intra-op Plan:   Post-operative Plan: Extubation in OR  Informed Consent: I have reviewed the patients History and Physical, chart, labs and discussed the procedure including the risks, benefits and alternatives for the proposed anesthesia with the patient or authorized representative who has indicated his/her understanding and acceptance.     Dental advisory given  Plan Discussed with: CRNA and Surgeon  Anesthesia Plan Comments:        Anesthesia Quick Evaluation

## 2020-01-19 LAB — GLUCOSE, CAPILLARY
Glucose-Capillary: 143 mg/dL — ABNORMAL HIGH (ref 70–99)
Glucose-Capillary: 140 mg/dL — ABNORMAL HIGH (ref 70–99)
Glucose-Capillary: 117 mg/dL — ABNORMAL HIGH (ref 70–99)
Glucose-Capillary: 128 mg/dL — ABNORMAL HIGH (ref 70–99)

## 2020-01-19 LAB — CBC
HCT: 36.9 % (ref 36.0–46.0)
Hemoglobin: 11.7 g/dL — ABNORMAL LOW (ref 12.0–15.0)
MCH: 25.9 pg — ABNORMAL LOW (ref 26.0–34.0)
MCHC: 31.7 g/dL (ref 30.0–36.0)
MCV: 81.6 fL (ref 80.0–100.0)
Platelets: 323 10*3/uL (ref 150–400)
RBC: 4.52 MIL/uL (ref 3.87–5.11)
RDW: 15.1 % (ref 11.5–15.5)
WBC: 7.9 10*3/uL (ref 4.0–10.5)
nRBC: 0 % (ref 0.0–0.2)

## 2020-01-19 MED ORDER — BOOST / RESOURCE BREEZE PO LIQD CUSTOM
1.0000 | Freq: Three times a day (TID) | ORAL | Status: DC
  Administered 2020-01-19 – 2020-01-20 (×3): 1 via ORAL

## 2020-01-19 NOTE — Progress Notes (Signed)
1 Day Post-Op   Subjective/Chief Complaint: Still with epigastric abdominal pain    Objective: Vital signs in last 24 hours: Temp:  [97.7 F (36.5 C)-99.1 F (37.3 C)] 98 F (36.7 C) (09/18 0446) Pulse Rate:  [64-80] 80 (09/18 0446) Resp:  [13-18] 18 (09/18 0446) BP: (141-173)/(97-117) 149/100 (09/18 0446) SpO2:  [94 %-100 %] 94 % (09/18 0446) Weight:  [112.2 kg] 112.2 kg (09/17 1434) Last BM Date: 01/19/20  Intake/Output from previous day: 09/17 0701 - 09/18 0700 In: 2737.2 [P.O.:180; I.V.:2557.2] Out: 0  Intake/Output this shift: No intake/output data recorded.   Gen: NAD  Resp: nonlabored  Card: RRR  Abd: soft, NT, ND Lab Results:  Recent Labs    01/18/20 0310 01/19/20 0345  WBC 8.9 7.9  HGB 11.7* 11.7*  HCT 36.8 36.9  PLT 309 323   BMET Recent Labs    01/17/20 0323 01/18/20 0310  NA 134* 136  K 2.9* 3.3*  CL 100 103  CO2 25 26  GLUCOSE 269* 117*  BUN 10 5*  CREATININE 0.77 0.67  CALCIUM 7.8* 8.0*   PT/INR No results for input(s): LABPROT, INR in the last 72 hours. ABG No results for input(s): PHART, HCO3 in the last 72 hours.  Invalid input(s): PCO2, PO2  Studies/Results: No results found.  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

## 2020-01-20 LAB — CBC
HCT: 34.9 % — ABNORMAL LOW (ref 36.0–46.0)
Hemoglobin: 11.3 g/dL — ABNORMAL LOW (ref 12.0–15.0)
MCH: 26.3 pg (ref 26.0–34.0)
MCHC: 32.4 g/dL (ref 30.0–36.0)
MCV: 81.4 fL (ref 80.0–100.0)
Platelets: 335 10*3/uL (ref 150–400)
RBC: 4.29 MIL/uL (ref 3.87–5.11)
RDW: 15.3 % (ref 11.5–15.5)
WBC: 17.6 10*3/uL — ABNORMAL HIGH (ref 4.0–10.5)
nRBC: 0 % (ref 0.0–0.2)

## 2020-01-20 LAB — GLUCOSE, CAPILLARY
Glucose-Capillary: 112 mg/dL — ABNORMAL HIGH (ref 70–99)
Glucose-Capillary: 104 mg/dL — ABNORMAL HIGH (ref 70–99)
Glucose-Capillary: 94 mg/dL (ref 70–99)
Glucose-Capillary: 108 mg/dL — ABNORMAL HIGH (ref 70–99)

## 2020-01-20 NOTE — Progress Notes (Signed)
2 Days Post-Op   Subjective/Chief Complaint: About the same  Trying to increase po intake  Some epigastric pain    Objective: Vital signs in last 24 hours: Temp:  [98.1 F (36.7 C)-98.3 F (36.8 C)] 98.1 F (36.7 C) (09/19 0532) Pulse Rate:  [67-75] 67 (09/19 0532) Resp:  [14-16] 14 (09/19 0532) BP: (144-155)/(94-102) 144/101 (09/19 0532) SpO2:  [96 %-100 %] 100 % (09/19 0532) Last BM Date: 01/19/20  Intake/Output from previous day: 09/18 0701 - 09/19 0700 In: 2457.1 [P.O.:180; I.V.:2277.1] Out: 0  Intake/Output this shift: No intake/output data recorded.  General appearance: alert and cooperative Resp: clear to auscultation bilaterally Cardio: regular rate and rhythm, S1, S2 normal, no murmur, click, rub or gallop GI: mild epigastric tenderness no rebound or guarding   Lab Results:  Recent Labs    01/19/20 0345 01/20/20 0408  WBC 7.9 17.6*  HGB 11.7* 11.3*  HCT 36.9 34.9*  PLT 323 335   BMET Recent Labs    01/18/20 0310  NA 136  K 3.3*  CL 103  CO2 26  GLUCOSE 117*  BUN 5*  CREATININE 0.67  CALCIUM 8.0*   PT/INR No results for input(s): LABPROT, INR in the last 72 hours. ABG No results for input(s): PHART, HCO3 in the last 72 hours.  Invalid input(s): PCO2, PO2  Studies/Results: No results found.  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

## 2020-01-20 NOTE — Plan of Care (Signed)

## 2020-01-21 LAB — CBC
HCT: 35.3 % — ABNORMAL LOW (ref 36.0–46.0)
Hemoglobin: 11.3 g/dL — ABNORMAL LOW (ref 12.0–15.0)
MCH: 25.9 pg — ABNORMAL LOW (ref 26.0–34.0)
MCHC: 32 g/dL (ref 30.0–36.0)
MCV: 81 fL (ref 80.0–100.0)
Platelets: 332 10*3/uL (ref 150–400)
RBC: 4.36 MIL/uL (ref 3.87–5.11)
RDW: 15.4 % (ref 11.5–15.5)
WBC: 12.7 10*3/uL — ABNORMAL HIGH (ref 4.0–10.5)
nRBC: 0 % (ref 0.0–0.2)

## 2020-01-21 LAB — GLUCOSE, CAPILLARY
Glucose-Capillary: 113 mg/dL — ABNORMAL HIGH (ref 70–99)
Glucose-Capillary: 107 mg/dL — ABNORMAL HIGH (ref 70–99)
Glucose-Capillary: 109 mg/dL — ABNORMAL HIGH (ref 70–99)
Glucose-Capillary: 109 mg/dL — ABNORMAL HIGH (ref 70–99)

## 2020-01-21 MED ORDER — PROMETHAZINE HCL 25 MG/ML IJ SOLN
12.5000 mg | Freq: Four times a day (QID) | INTRAMUSCULAR | Status: DC | PRN
  Administered 2020-01-22 – 2020-01-23 (×2): 12.5 mg via INTRAVENOUS
  Filled 2020-01-21 (×2): qty 1

## 2020-01-21 NOTE — Plan of Care (Signed)

## 2020-01-21 NOTE — Progress Notes (Signed)
Spent some time with patient discussing treatment plan.  She had questions about earlier discussion with physician. Those questions were answered.  Completed 2 tablespoons of tuna, 1 pack club cracker, and 1/4 cup water for lunch. Was doing well then stated she took some medication (carafate, Cytotec) and started feeling unwell.  No emesis, some pain described as crampy.  Expressed feeling of anxiety related to lack of support from family.  Only a select few family members aware of patient surgery. We discussed strategies for support including joining support group. Will continue to monitor, encouraged oral intake.

## 2020-01-21 NOTE — Plan of Care (Signed)
  Problem: Education: Goal: Knowledge of General Education information will improve Description Including pain rating scale, medication(s)/side effects and non-pharmacologic comfort measures Outcome: Progressing   

## 2020-01-22 ENCOUNTER — Inpatient Hospital Stay: Payer: Self-pay

## 2020-01-22 ENCOUNTER — Encounter (HOSPITAL_COMMUNITY): Payer: Self-pay | Admitting: General Surgery

## 2020-01-22 LAB — GLUCOSE, CAPILLARY
Glucose-Capillary: 105 mg/dL — ABNORMAL HIGH (ref 70–99)
Glucose-Capillary: 106 mg/dL — ABNORMAL HIGH (ref 70–99)
Glucose-Capillary: 97 mg/dL (ref 70–99)
Glucose-Capillary: 103 mg/dL — ABNORMAL HIGH (ref 70–99)

## 2020-01-22 LAB — SURGICAL PATHOLOGY

## 2020-01-22 NOTE — Progress Notes (Signed)
Pt unable to tolerate dinner, had few bites of [place] and cheese. Pt couldn't, keep it down, she vomited with dry heaves. Administered IV Phenergan.

## 2020-01-22 NOTE — TOC Transition Note (Addendum)
Transition of Care (TOC) - CM/SW Discharge Note   Patient Details  Name: Madison [name] MRN: [id number] Date of Birth: 01/14/1976  Transition of Care (TOC) CM/SW Contact:  HOYLE, LUCY, LCSW Phone Number: 01/22/2020, 3:53 PM   Clinical Narrative:    Alerted by RN of orders for HHRN for home IVF.  Met with pt and introduced myself and discussed HH follow up.  Pt is agreeable to plan and has no agency preference.   Contacted Pam Chandler with [facility] who will provide the infusion supplies and plan to pair with [facility] for HHRN coverage.  TOC will continue to monitor if any further needs.     Final next level of care: Home w Home Health Services Barriers to Discharge: Continued Medical Work up   Patient Goals and CMS Choice        Discharge Placement                       Discharge Plan and Services                DME Arranged: N/A DME Agency: NA       HH Arranged: RN HH Agency: [facility] Date HH Agency Contacted: 01/22/20 Time HH Agency Contacted: 1549 Representative spoke with at HH Agency: Pam Chandler  Social Determinants of Health (SDOH) Interventions     Readmission Risk Interventions No flowsheet data found.     

## 2020-01-22 NOTE — Progress Notes (Signed)
Ordered lunch, has difficulty with fluid intake. Fluids changed for preference. Reports cramping discomfort at this time. Will check back in with patient after lunch.  Carafate to be given before tray by bedside RN.

## 2020-01-22 NOTE — Progress Notes (Signed)
Progress Note: General Surgery Service   Chief Complaint/Subjective: Tolerated tuna yesterday, had pork but had vomiting afterward, anxious about getting better  Objective: Vital signs in last 24 hours: Temp:  [98.2 F (36.8 C)-98.9 F (37.2 C)] 98.4 F (36.9 C) (09/21 7858) Pulse Rate:  [50-66] 50 (09/21 0638) Resp:  [18] 18 (09/21 8502) BP: (134-170)/(96-105) 170/99 (09/21 0638) SpO2:  [97 %-100 %] 97 % (09/21 0638) Last BM Date: 01/21/20  Intake/Output from previous day: 09/20 0701 - 09/21 0700 In: 863.1 [P.O.:240; I.V.:623.1] Out: -  Intake/Output this shift: No intake/output data recorded.  Gen: NAD  Resp: nonlabored  Card: RRR  Abd: soft, NT, ND  Lab Results: CBC  Recent Labs    01/20/20 0408 01/21/20 0323  WBC 17.6* 12.7*  HGB 11.3* 11.3*  HCT 34.9* 35.3*  PLT 335 332   BMET No results for input(s): NA, K, CL, CO2, GLUCOSE, BUN, CREATININE, CALCIUM in the last 72 hours. PT/INR No results for input(s): LABPROT, INR in the last 72 hours. ABG No results for input(s): PHART, HCO3 in the last 72 hours.  Invalid input(s): PCO2, PO2  Anti-infectives: Anti-infectives (From admission, onward)   Start     Dose/Rate Route Frequency Ordered Stop   01/17/20 1345  erythromycin (E-MYCIN) tablet 250 mg        250 mg Oral Every 6 hours 01/17/20 1343        Medications: Scheduled Meds: . enoxaparin (LOVENOX) injection  40 mg Subcutaneous Q24H  . erythromycin  250 mg Oral Q6H  . feeding supplement  1 Container Oral TID BM  . misoprostol  200 mcg Oral Q6H  . pantoprazole (PROTONIX) IV  40 mg Intravenous Q12H  . sucralfate  1 g Oral TID WC & HS   Continuous Infusions: . sodium chloride 250 mL (01/17/20 1718)  . dextrose 5 % and 0.45% NaCl 100 mL/hr at 01/22/20 0528   PRN Meds:.sodium chloride, acetaminophen **OR** acetaminophen, iohexol, metoprolol tartrate, morphine injection, ondansetron **OR** ondansetron (ZOFRAN) IV, promethazine  Assessment/Plan: s/p  Procedure(s): ESOPHAGOGASTRODUODENOSCOPY (EGD) WITH PROPOFOL BIOPSY 01/18/2020  44 yo female s/p RNY gastric bypass with marginal ulcer -continue PPI, carafate, cytotec -continue zofran, phenergan -home today if tolerates well this morning   LOS: 6 days   Mickeal Skinner, MD [facility]

## 2020-01-22 NOTE — Progress Notes (Signed)
Received order for PICC  

## 2020-01-22 NOTE — Progress Notes (Signed)
Unable to tolerate lunch, had 2 bites of chicken salad then vomited with continued dry heaves.  Later able to tolerate 2 ounces of hot tea.  Explained to patient about PICC line placement and home hydration.  Questions answered.

## 2020-01-22 NOTE — Progress Notes (Signed)
S: tolerating some liquids yesterday, nauseated this morning O: BP (!) 170/99   Pulse (!) 50   Temp 98.4 F (36.9 C) (Oral)   Resp 18   Ht 5\' 1"  (1.549 m)   Wt 112.2 kg   LMP 01/17/2012   SpO2 97%   BMI 46.74 kg/m  Gen: NAd Neuro: AOX4 Abd: soft, NT, ND  A/P 44 yo female s/p RNY gastric bypass with marginal ulcer -continue PPI, carafate, cytotec -zofran, phenergan -[facility] to carb modified diet for variety

## 2020-01-23 LAB — CREATININE, SERUM
Creatinine, Ser: 0.87 mg/dL (ref 0.44–1.00)
GFR calc Af Amer: >60 mL/min (ref >60)
GFR calc non Af Amer: >60 mL/min (ref >60)

## 2020-01-23 LAB — GLUCOSE, CAPILLARY
Glucose-Capillary: 102 mg/dL — ABNORMAL HIGH (ref 70–99)
Glucose-Capillary: 97 mg/dL (ref 70–99)

## 2020-01-23 MED ORDER — CHLORHEXIDINE GLUCONATE CLOTH 2 % EX PADS
6.0000 | MEDICATED_PAD | Freq: Every day | CUTANEOUS | Status: DC
  Administered 2020-01-23: 6 via TOPICAL

## 2020-01-23 MED ORDER — HEPARIN SOD (PORK) LOCK FLUSH 100 UNIT/ML IV SOLN
250.0000 [IU] | INTRAVENOUS | Status: AC | PRN
  Administered 2020-01-23 (×2): 250 [IU]
  Filled 2020-01-23: qty 2.5

## 2020-01-23 MED ORDER — PROMETHAZINE HCL 12.5 MG PO TABS: 12.5000 mg | ORAL_TABLET | Freq: Four times a day (QID) | ORAL | 0 refills | Status: AC | PRN

## 2020-01-23 MED ORDER — SODIUM CHLORIDE 0.9% FLUSH: 10.0000 mL | Freq: Two times a day (BID) | INTRAVENOUS | Status: DC

## 2020-01-23 MED ORDER — SODIUM CHLORIDE 0.9% FLUSH
10.0000 mL | INTRAVENOUS | Status: DC | PRN
  Administered 2020-01-23 (×2): 10 mL

## 2020-01-23 NOTE — Progress Notes (Signed)
Progress Note: General Surgery Service   Chief Complaint/Subjective: Nausea yesterday, vomited after dinner, ambulating well  Objective: Vital signs in last 24 hours: Temp:  [98.9 F (37.2 C)] 98.9 F (37.2 C) (09/22 0610) Pulse Rate:  [52-59] 52 (09/22 0610) Resp:  [18] 18 (09/22 0610) BP: (129-143)/(97-101) 143/97 (09/22 0610) SpO2:  [98 %-100 %] 98 % (09/22 0610) Last BM Date: 01/21/20  Intake/Output from previous day: 09/21 0701 - 09/22 0700 In: 2537.4 [P.O.:260; I.V.:2277.4] Out: 3 [Urine:2; Emesis/NG output:1] Intake/Output this shift: No intake/output data recorded.  Gen: NAD  Resp: nonlabored  Card: bradycardic  Abd: soft, NT, ND  Lab Results: CBC  Recent Labs    01/21/20 0323  WBC 12.7*  HGB 11.3*  HCT 35.3*  PLT 332   BMET Recent Labs    01/23/20 0326  CREATININE 0.87   PT/INR No results for input(s): LABPROT, INR in the last 72 hours. ABG No results for input(s): PHART, HCO3 in the last 72 hours.  Invalid input(s): PCO2, PO2  Anti-infectives: Anti-infectives (From admission, onward)   Start     Dose/Rate Route Frequency Ordered Stop   01/17/20 1345  erythromycin (E-MYCIN) tablet 250 mg        250 mg Oral Every 6 hours 01/17/20 1343        Medications: Scheduled Meds: . Chlorhexidine Gluconate Cloth  6 each Topical Daily  . enoxaparin (LOVENOX) injection  40 mg Subcutaneous Q24H  . erythromycin  250 mg Oral Q6H  . feeding supplement  1 Container Oral TID BM  . misoprostol  200 mcg Oral Q6H  . pantoprazole (PROTONIX) IV  40 mg Intravenous Q12H  . sodium chloride flush  10-40 mL Intracatheter Q12H  . sucralfate  1 g Oral TID WC & HS   Continuous Infusions: . sodium chloride 250 mL (01/17/20 1718)  . dextrose 5 % and 0.45% NaCl 100 mL/hr at 01/23/20 0149   PRN Meds:.sodium chloride, acetaminophen **OR** acetaminophen, iohexol, metoprolol tartrate, morphine injection, ondansetron **OR** ondansetron (ZOFRAN) IV, promethazine, sodium  chloride flush  Assessment/Plan: s/p Procedure(s): ESOPHAGOGASTRODUODENOSCOPY (EGD) WITH PROPOFOL BIOPSY 01/18/2020  PICC line inserted today Arranging home health Continue to try different foods Continue maximal medical therapy Home today if home health arranged   LOS: 7 days   Mickeal Skinner, MD [facility]

## 2020-01-23 NOTE — Progress Notes (Signed)
Peripherally Inserted Central Catheter Placement  The IV Nurse has discussed with the patient and/or persons authorized to consent for the patient, the purpose of this procedure and the potential benefits and risks involved with this procedure.  The benefits include less needle sticks, lab draws from the catheter, and the patient may be discharged home with the catheter. Risks include, but not limited to, infection, bleeding, blood clot (thrombus formation), and puncture of an artery; nerve damage and irregular heartbeat and possibility to perform a PICC exchange if needed/ordered by physician.  Alternatives to this procedure were also discussed.  Bard Power PICC patient education guide, fact sheet on infection prevention and patient information card has been provided to patient /or left at bedside.    PICC Placement Documentation  PICC Single Lumen 61/84/85 PICC Right Basilic 37 cm 0 cm (Active)  Indication for Insertion or Continuance of Line Home intravenous therapies (PICC only) 01/23/20 1000  Exposed Catheter (cm) 0 cm 01/23/20 1000  Site Assessment Clean;Dry;Intact 01/23/20 1000  Line Status Flushed;Saline locked;Blood return noted 01/23/20 1000  Dressing Type Transparent;Securing device 01/23/20 1000  Dressing Status Clean;Dry;Intact 01/23/20 1000  Antimicrobial disc in place? Yes 01/23/20 1000  Line Care Connections checked and tightened 01/23/20 1000  Dressing Change Due 01/30/20 01/23/20 1000       Holley Bouche Renee 01/23/2020, 10:10 AM

## 2020-01-23 NOTE — Progress Notes (Signed)
PICC line placed this morning,  LCSW Lucy Hoyle contacting [employer] ([facility]) to coordinate meeting  time to discuss IV hydration at home with patient.  [place] currently  working on [employer], no breakfast.  Vague nausea after medication this morning and hesitant to have solid protein.

## 2020-01-23 NOTE — Progress Notes (Signed)
Discussed contents of AVS including medications and follow up with Dr Feliciana Rossetti at [facility].  Patient instructed by [facility], Jeri Modena on home instructions and supplies for IV hydration.  IV team flushed PICC prior to discharge.  Patient ready to go home.  Ate lunch and states she was able to keep food down. AVS printed and provided to patient by bedside RN.

## 2020-01-23 NOTE — Progress Notes (Signed)
Patient was given discharge instructions, and all questions were answered.  Patient was taken to main exit by wheelchair. 

## 2020-01-24 NOTE — Discharge Summary (Signed)
Physician Discharge Summary  Madison [name] BDZ:[id number] DOB: Apr 17, 1976 DOA: 01/16/2020  PCP: Madison Pepper, MD  Admit date: 01/16/2020 Discharge date: 01/24/2020  Recommendations for Outpatient Follow-up:  1. Home health IV fluid administration (include homehealth, outpatient follow-up instructions, specific recommendations for PCP to follow-up on, etc.)   Follow-up Information    [facility] Follow up.   Why: to provide home health nursing management of IV fluids Contact information: ph:  [phone]       Kenya Kook, Arta Bruce, MD. Schedule an appointment as soon as possible for a visit in 2 week(s).   Specialty: General Surgery Why: Call [facility] to arrange appointment with Dr [name] in 2-3 weeks.   Contact information: [place] [address] [phone]              Discharge Diagnoses:  Active Problems:   Vomiting (bilious) following gastrointestinal surgery   Surgical Procedure: endoscopy  Discharge Condition: Good Disposition: Home  Diet recommendation: carb modified diet   Hospital Course:  44 yo female was admitted due to severe dehydration. She underwetn UGi that was normal. She underwent EGD showing a moderate sized ulcer. She was continued on PPI and carafate and misoprostol. She continued to have difficulty tolerating certain foods. Decision was made to place PICC and transition to home since diagnosis is made. Once this was complete she was discharged home.  Discharge Instructions  Discharge Instructions    Ambulate hourly while awake   Complete by: As directed    Call MD for:  difficulty breathing, headache or visual disturbances   Complete by: As directed    Call MD for:  persistant dizziness or light-headedness   Complete by: As directed    Call MD for:  persistant nausea and vomiting   Complete by: As directed    Call MD for:  redness, tenderness, or signs of infection (pain, swelling, redness,  odor or green/yellow discharge around incision site)   Complete by: As directed    Call MD for:  severe uncontrolled pain   Complete by: As directed    Call MD for:  temperature >101 F   Complete by: As directed    Incentive spirometry   Complete by: As directed    Perform hourly while awake     Allergies as of 01/23/2020      Reactions   Oxycodone Nausea Only   Adhesive [tape] Rash      Medication List    STOP taking these medications   gabapentin 100 MG capsule Commonly known as: NEURONTIN     TAKE these medications   misoprostol 200 MCG tablet Commonly known as: Cytotec Take 1 tablet (200 mcg total) by mouth 4 (four) times daily.   ondansetron 4 MG disintegrating tablet Commonly known as: ZOFRAN-ODT Take 1 tablet (4 mg total) by mouth every 6 (six) hours as needed for nausea.   pantoprazole 40 MG tablet Commonly known as: PROTONIX Take 1 tablet (40 mg total) by mouth 2 (two) times daily for 60 doses.   promethazine 12.5 MG tablet Commonly known as: PHENERGAN Take 1 tablet (12.5 mg total) by mouth every 6 (six) hours as needed for nausea or vomiting.   sucralfate 1 GM/10ML suspension Commonly known as: CARAFATE Take 10 mLs (1 g total) by mouth 4 (four) times daily -  with meals and at bedtime.       Follow-up [facility] Follow up.   Why: to  provide home health nursing management of IV fluids Contact information: ph:  [phone]       [name], Arta Bruce, MD. Schedule an appointment as soon as possible for a visit in 2 week(s).   Specialty: General Surgery Why: Call [facility] to arrange appointment with Dr [name] in 2-3 weeks.   Contact information: [address] [place] [address] [phone]                The results of significant diagnostics from this hospitalization (including imaging, microbiology, ancillary and laboratory) are listed below for reference.    Significant Diagnostic Studies: DG  UGI W SINGLE CM (SOL OR THIN BA)  Result Date: 01/17/2020 CLINICAL DATA:  Gastric bypass 12/04/2019.  Bilious vomiting. EXAM: UPPER GI SERIES WITHOUT KUB TECHNIQUE: Routine upper GI series was performed with thin barium barium. FLUOROSCOPY TIME:  Fluoroscopy Time:  1.6 minutes Radiation Exposure Index (if provided by the fluoroscopic device): 30.4 mGy Number of Acquired Spot Images: 0 COMPARISON:  None. FINDINGS: Fluoroscopic evaluation of swallowing demonstrates poor esophageal peristalsis. No fixed stricture or fold thickening. There is a small gastric pouch which immediately empties into the small bowel through the gastrojejunostomy. Visualized small bowel normal caliber. Poor peristalsis noted, but no evidence of obstruction or leak. After several swallows of barium, the patient began vomiting and the study was terminated. IMPRESSION: Poor peristalsis noted within the esophagus and proximal bowel. No evidence of bowel obstruction or anastomotic leak. Electronically Signed   By: Rolm Baptise M.D.   On: 01/17/2020 09:16   [place] EKG SITE RITE  Result Date: 01/22/2020 If Site Rite image not attached, placement could not be confirmed due to current cardiac rhythm.   Labs: Basic Metabolic Panel: Recent Labs  Lab 01/18/20 0310 01/23/20 0326  NA 136  --   K 3.3*  --   CL 103  --   CO2 26  --   GLUCOSE 117*  --   BUN 5*  --   CREATININE 0.67 0.87  CALCIUM 8.0*  --    Liver Function Tests: No results for input(s): AST, ALT, ALKPHOS, BILITOT, PROT, ALBUMIN in the last 168 hours.  CBC: Recent Labs  Lab 01/18/20 0310 01/19/20 0345 01/20/20 0408 01/21/20 0323  WBC 8.9 7.9 17.6* 12.7*  HGB 11.7* 11.7* 11.3* 11.3*  HCT 36.8 36.9 34.9* 35.3*  MCV 82.1 81.6 81.4 81.0  PLT 309 323 335 332    CBG: Recent Labs  Lab 01/22/20 1149 01/22/20 1634 01/22/20 1949 01/23/20 0730 01/23/20 1124  GLUCAP 106* 97 103* 102* 97    Active Problems:   Vomiting (bilious) following gastrointestinal  surgery   Time coordinating discharge: 15 min

## 2020-02-01 ENCOUNTER — Encounter: Payer: Self-pay | Admitting: Dietician

## 2020-02-01 ENCOUNTER — Encounter: Payer: 59 | Attending: General Surgery | Admitting: Dietician

## 2020-02-01 ENCOUNTER — Other Ambulatory Visit: Payer: Self-pay

## 2020-02-01 DIAGNOSIS — E669 Obesity, unspecified: Secondary | ICD-10-CM | POA: Diagnosis not present

## 2020-02-01 NOTE — Progress Notes (Signed)
Bariatric Nutrition Follow-Up Visit Medical Nutrition Therapy  Appt Start Time: 8:05am    End Time: 8:50am  2 Months Post-Operative Sleeve Gastrectomy Surgery Surgery Date: 12/04/2019  Pt's Expectations of Surgery/ Goals: to lose weight in order to prevent future health problems, be more active/ have more energy, and live longer to be around with her grandkids Pt Reported Successes: none    NUTRITION ASSESSMENT  Anthropometrics  Start weight at [facility]: 245.5 lbs (date: 02/16/2019) Today's weight: 209.1 lbs  Body Composition Scale 12/19/19 02/01/20  Weight  lbs 227.8 209.1  BMI 43 39.5  Total Body Fat  % 44.9 42.8     Visceral Fat 15 14  Fat-Free Mass  % 55 57.1     Total Body Water  % 42 43     Muscle-Mass  lbs 29.3 29  Body Fat Displacement --- ---         Torso  lbs 63.4 55.4         Left Leg  lbs 12.6 11         Right Leg  lbs 12.6 11         Left Arm  lbs 6.3 5.5         Right Arm  lbs 6.3 5.5    Lifestyle & Dietary Hx Patient states she is unable to get enough fluids in. States she has been hospitalized twice since surgery due to this and issues with ulcers. Typical meal pattern is 2-3 snacks per day. Able to drink apple juice, V8 Splash (1g sugar) and Ocean Spray unsweetened grape juice, states water is painful to drink. No issues with eating certain foods, such as bacon, shrimp, fish, [place], cheese, and hamburger. We discussed the importance of prioritizing protein and fluid intake, and discussed expanding diet to include vegetables (non-starchy and starchy) if it helps patient to increase overall intake. Patient was provided an unsweetened protein powder sample and was encouraged to try it, as well as to be open to try sipping other fluids.   24-Hr Dietary Recall First Meal: V8 Splash (1g sugar) Snack: -  Second Meal: 2 bacon Snack: -  Third Meal: 2 shrimp Snack: - Beverages: juice, V8 Splash, Ocean Spray unsweetened juice  Estimated daily fluid intake: 32 oz Estimated  daily protein intake: ~40 g Supplements: bariatric MVI, calcium  Current average weekly physical activity: none   Post-Op Goals/ Signs/ Symptoms Using straws: no Drinking while eating: no Chewing/swallowing difficulties: no Changes in vision: no Changes to mood/headaches: no Hair loss/changes to skin/nails: no Difficulty focusing/concentrating: no Sweating: no Dizziness/lightheadedness: no Palpitations: no  Carbonated/caffeinated beverages: no N/V/D/C/Gas: constipation, diarrhea Abdominal pain: yes Dumping syndrome: yes   NUTRITION DIAGNOSIS  Overweight/obesity ([place]-3.3) related to past poor dietary habits and physical inactivity as evidenced by completed bariatric surgery and following dietary guidelines for continued weight loss and healthy nutrition status.   NUTRITION INTERVENTION Nutrition counseling (C-1) and education (E-2) to facilitate bariatric surgery goals, including: . Diet advancement to the next phase (phase 4) now including non-starchy vegetables . The importance of consuming adequate calories as well as certain nutrients daily due to the body's need for essential vitamins, minerals, and fats . The importance of daily physical activity and to reach a goal of at least 150 minutes of moderate to vigorous physical activity weekly (or as directed by their physician) due to benefits such as increased musculature and improved lab values  Handouts Provided Include   Phase 4: Protein + Non-Starchy Vegetables   Learning  Style & Readiness for Change Teaching method utilized: Visual & Auditory  Demonstrated degree of understanding via: Teach Back  Barriers to learning/adherence to lifestyle change: contemplative stage of change, difficulty drinking adequate fluids, obstinate to trying variety of fluids/foods to assess tolerance    MONITORING & EVALUATION Dietary intake, weekly physical activity, body weight, and fluid intake.  Next Steps Patient is to follow-up in 4  months for 6 month post-op class. Patient was encouraged to follow up sooner if needed to ensure proper hydration and nutrition but did not want to do so at this time.

## 2020-02-01 NOTE — Patient Instructions (Signed)

## 2020-03-19 NOTE — Progress Notes (Signed)
Pt. Needs orders for upcomming procedure.PAT and labs. appointment on 03/20/20.Thanks.

## 2020-03-19 NOTE — Patient Instructions (Addendum)
DUE TO COVID-19 ONLY ONE VISITOR IS ALLOWED TO COME WITH YOU AND STAY IN THE WAITING ROOM ONLY DURING PRE OP AND PROCEDURE DAY OF SURGERY. THE 1 VISITOR  MAY VISIT WITH YOU AFTER SURGERY IN YOUR PRIVATE ROOM DURING VISITING HOURS ONLY!  YOU NEED TO HAVE A COVID 19 TEST ON: 03/20/20 , THIS TEST MUST BE DONE BEFORE SURGERY,  COVID TESTING SITE [address] [place] [address], IT IS ON THE RIGHT GOING OUT [address] APPROXIMATELY  2 MINUTES PAST [employer] ON THE RIGHT. ONCE YOUR COVID TEST IS COMPLETED,  PLEASE BEGIN THE QUARANTINE INSTRUCTIONS AS OUTLINED IN YOUR HANDOUT.                [employer]    Your procedure is scheduled on: 03/24/20   Report to [facility] Main  Entrance   Report to admitting at: 11:00 AM     Call this number if you have problems the morning of surgery [phone]    Remember: Do not eat food or drink liquids :After Midnight. Clear liquids until: 10:00 am.   BRUSH YOUR TEETH MORNING OF SURGERY AND RINSE YOUR MOUTH OUT, NO CHEWING GUM CANDY OR MINTS.     Take these medicines the morning of surgery with A SIP OF WATER: pantoprazole.                                You may not have any metal on your body including hair pins and              piercings  Do not wear jewelry, make-up, lotions, powders or perfumes, deodorant             Do not wear nail polish on your fingernails.  Do not shave  48 hours prior to surgery.            Do not bring valuables to the hospital. Ernest.  Contacts, dentures or bridgework may not be worn into surgery.  Leave suitcase in the car. After surgery it may be brought to your room.     Patients discharged the day of surgery will not be allowed to drive home. IF YOU ARE HAVING SURGERY AND GOING HOME THE SAME DAY, YOU MUST HAVE AN ADULT TO DRIVE YOU HOME AND BE WITH YOU FOR 24 HOURS. YOU MAY GO HOME BY TAXI OR UBER OR ORTHERWISE, BUT AN  ADULT MUST ACCOMPANY YOU HOME AND STAY WITH YOU FOR 24 HOURS.  Name and phone number of your driver:  Special Instructions: N/A              Please read over the following fact sheets you were given: _____________________________________________________________________        [facility] - Preparing for Surgery Before surgery, you can play an important role.  Because skin is not sterile, your skin needs to be as free of germs as possible.  You can reduce the number of germs on your skin by washing with CHG (chlorahexidine gluconate) soap before surgery.  CHG is an antiseptic cleaner which kills germs and bonds with the skin to continue killing germs even after washing. Please DO NOT use if you have an allergy to CHG or antibacterial soaps.  If your skin becomes reddened/irritated stop using the CHG and  inform your nurse when you arrive at Short Stay. Do not shave (including legs and underarms) for at least 48 hours prior to the first CHG shower.  You may shave your face/neck. Please follow these instructions carefully:  1.  Shower with CHG Soap the night before surgery and the  morning of Surgery.  2.  If you choose to wash your hair, wash your hair first as usual with your  normal  shampoo.  3.  After you shampoo, rinse your hair and body thoroughly to remove the  shampoo.                           4.  Use CHG as you would any other liquid soap.  You can apply chg directly  to the skin and wash                       Gently with a scrungie or clean washcloth.  5.  Apply the CHG Soap to your body ONLY FROM THE NECK DOWN.   Do not use on face/ open                           Wound or open sores. Avoid contact with eyes, ears mouth and genitals (private parts).                       Wash face,  Genitals (private parts) with your normal soap.             6.  Wash thoroughly, paying special attention to the area where your surgery  will be performed.  7.  Thoroughly rinse your body with warm water from  the neck down.  8.  DO NOT shower/wash with your normal soap after using and rinsing off  the CHG Soap.                9.  Pat yourself dry with a clean towel.            10.  Wear clean pajamas.            11.  Place clean sheets on your bed the night of your first shower and do not  sleep with pets. Day of Surgery : Do not apply any lotions/deodorants the morning of surgery.  Please wear clean clothes to the hospital/surgery center.  FAILURE TO FOLLOW THESE INSTRUCTIONS MAY RESULT IN THE CANCELLATION OF YOUR SURGERY PATIENT SIGNATURE_________________________________  NURSE SIGNATURE__________________________________  ________________________________________________________________________

## 2020-03-20 ENCOUNTER — Encounter (HOSPITAL_COMMUNITY)
Admission: RE | Admit: 2020-03-20 | Discharge: 2020-03-20 | Disposition: A | Discharge status: Home / Self Care | Payer: 59 | Source: Ambulatory Visit | Attending: General Surgery | Admitting: General Surgery

## 2020-03-20 ENCOUNTER — Other Ambulatory Visit (HOSPITAL_COMMUNITY)
Admission: RE | Admit: 2020-03-20 | Discharge: 2020-03-20 | Disposition: A | Discharge status: Home / Self Care | Payer: 59 | Source: Ambulatory Visit | Attending: General Surgery | Admitting: General Surgery

## 2020-03-20 ENCOUNTER — Other Ambulatory Visit: Payer: Self-pay

## 2020-03-20 ENCOUNTER — Ambulatory Visit: Payer: Self-pay | Admitting: General Surgery

## 2020-03-20 ENCOUNTER — Encounter (HOSPITAL_COMMUNITY): Payer: Self-pay

## 2020-03-20 DIAGNOSIS — Z20822 Contact with and (suspected) exposure to covid-19: Secondary | ICD-10-CM | POA: Diagnosis not present

## 2020-03-20 DIAGNOSIS — Z01812 Encounter for preprocedural laboratory examination: Secondary | ICD-10-CM | POA: Diagnosis present

## 2020-03-20 LAB — CBC
HCT: 35 % — ABNORMAL LOW (ref 36.0–46.0)
Hemoglobin: 11.1 g/dL — ABNORMAL LOW (ref 12.0–15.0)
MCH: 26.2 pg (ref 26.0–34.0)
MCHC: 31.7 g/dL (ref 30.0–36.0)
MCV: 82.7 fL (ref 80.0–100.0)
Platelets: 360 K/uL (ref 150–400)
RBC: 4.23 MIL/uL (ref 3.87–5.11)
RDW: 17.1 % — ABNORMAL HIGH (ref 11.5–15.5)
WBC: 12.1 K/uL — ABNORMAL HIGH (ref 4.0–10.5)
nRBC: 0 % (ref 0.0–0.2)

## 2020-03-20 LAB — SARS CORONAVIRUS 2 (TAT 6-24 HRS): SARS Coronavirus 2: NEGATIVE

## 2020-03-20 NOTE — Progress Notes (Addendum)
COVID Vaccine Completed: Date COVID Vaccine completed: 02/25/20 COVID vaccine manufacturer: Pfizer     PCP - Dr. London Pepper. LOV: 03/10/20 Cardiologist -   Chest x-ray - 07/25/19 EKG - 07/25/19 Stress Test -  ECHO -  Cardiac Cath -  Pacemaker/ICD device last checked:  Sleep Study -  CPAP -   Fasting Blood Sugar -  Checks Blood Sugar _____ times a day  Blood Thinner Instructions: Aspirin Instructions: Last Dose:  Anesthesia review:   Patient denies shortness of breath, fever, cough and chest pain at PAT appointment   Patient verbalized understanding of instructions that were given to them at the PAT appointment. Patient was also instructed that they will need to review over the PAT instructions again at home before surgery.

## 2020-03-24 ENCOUNTER — Ambulatory Visit (HOSPITAL_COMMUNITY)
Admission: RE | Admit: 2020-03-24 | Discharge: 2020-03-24 | Disposition: A | Discharge status: Home / Self Care | Payer: 59 | Attending: General Surgery | Admitting: General Surgery

## 2020-03-24 ENCOUNTER — Ambulatory Visit (HOSPITAL_COMMUNITY): Payer: 59 | Admitting: Physician Assistant

## 2020-03-24 ENCOUNTER — Other Ambulatory Visit: Payer: Self-pay

## 2020-03-24 ENCOUNTER — Telehealth (HOSPITAL_COMMUNITY): Payer: Self-pay | Admitting: *Deleted

## 2020-03-24 ENCOUNTER — Encounter (HOSPITAL_COMMUNITY)
Admission: RE | Disposition: A | Discharge status: Home / Self Care | Payer: Self-pay | Source: Home / Self Care | Attending: General Surgery

## 2020-03-24 ENCOUNTER — Ambulatory Visit (HOSPITAL_COMMUNITY): Payer: 59 | Admitting: Certified Registered Nurse Anesthetist

## 2020-03-24 DIAGNOSIS — K289 Gastrojejunal ulcer, unspecified as acute or chronic, without hemorrhage or perforation: Secondary | ICD-10-CM | POA: Insufficient documentation

## 2020-03-24 DIAGNOSIS — Z885 Allergy status to narcotic agent status: Secondary | ICD-10-CM | POA: Diagnosis not present

## 2020-03-24 DIAGNOSIS — Z888 Allergy status to other drugs, medicaments and biological substances status: Secondary | ICD-10-CM | POA: Insufficient documentation

## 2020-03-24 DIAGNOSIS — Z9884 Bariatric surgery status: Secondary | ICD-10-CM | POA: Insufficient documentation

## 2020-03-24 DIAGNOSIS — Z87891 Personal history of nicotine dependence: Secondary | ICD-10-CM | POA: Diagnosis not present

## 2020-03-24 DIAGNOSIS — Z79899 Other long term (current) drug therapy: Secondary | ICD-10-CM | POA: Insufficient documentation

## 2020-03-24 HISTORY — PX: ESOPHAGOGASTRODUODENOSCOPY: SHX5428

## 2020-03-24 SURGERY — EGD (ESOPHAGOGASTRODUODENOSCOPY)
Anesthesia: Monitor Anesthesia Care

## 2020-03-24 MED ORDER — PROPOFOL 500 MG/50ML IV EMUL
INTRAVENOUS | Status: DC | PRN
  Administered 2020-03-24: 150 ug/kg/min via INTRAVENOUS

## 2020-03-24 MED ORDER — SODIUM CHLORIDE 0.9 % IV SOLN: INTRAVENOUS | Status: DC

## 2020-03-24 MED ORDER — ONDANSETRON HCL 4 MG/2ML IJ SOLN
INTRAMUSCULAR | Status: DC | PRN
  Administered 2020-03-24: 4 mg via INTRAVENOUS

## 2020-03-24 MED ORDER — PROPOFOL 500 MG/50ML IV EMUL
INTRAVENOUS | Status: AC
  Filled 2020-03-24: qty 100

## 2020-03-24 MED ORDER — LACTATED RINGERS IV SOLN: INTRAVENOUS | Status: DC | PRN

## 2020-03-24 MED ORDER — PROPOFOL 10 MG/ML IV BOLUS
INTRAVENOUS | Status: DC | PRN
  Administered 2020-03-24 (×3): 20 mg via INTRAVENOUS

## 2020-03-24 NOTE — Anesthesia Postprocedure Evaluation (Signed)
Anesthesia Post Note  Patient: Madison [name]  Procedure(s) Performed: ESOPHAGOGASTRODUODENOSCOPY (EGD) (N/A )     Patient location during evaluation: Endoscopy Anesthesia Type: MAC Level of consciousness: awake and alert Pain management: pain level controlled Vital Signs Assessment: post-procedure vital signs reviewed and stable Respiratory status: spontaneous breathing, nonlabored ventilation, respiratory function stable and patient connected to nasal cannula oxygen Cardiovascular status: stable and blood pressure returned to baseline Postop Assessment: no apparent nausea or vomiting Anesthetic complications: no   No complications documented.  Last Vitals:  Vitals:   03/24/20 1345 03/24/20 1355  BP: (!) 171/111 (!) 166/101  Pulse: 78 76  Resp: 16 13  Temp:    SpO2: 99% 100%    Last Pain:  Vitals:   03/24/20 1345  TempSrc:   PainSc: 0-No pain                 Philicia Heyne L Airica Schwartzkopf

## 2020-03-24 NOTE — Transfer of Care (Signed)
Immediate Anesthesia Transfer of Care Note  Patient: Madison [name]  Procedure(s) Performed: ESOPHAGOGASTRODUODENOSCOPY (EGD) (N/A )  Patient Location: PACU and Endoscopy Unit  Anesthesia Type:MAC  Level of Consciousness: awake, alert , oriented and patient cooperative  Airway & Oxygen Therapy: Patient Spontanous Breathing and Patient connected to face mask oxygen  Post-op Assessment: Report given to RN, Post -op Vital signs reviewed and stable and Patient moving all extremities  Post vital signs: Reviewed and stable  Last Vitals:  Vitals Value Taken Time  BP    Temp    Pulse 73 03/24/20 1329  Resp 15 03/24/20 1329  SpO2 100 % 03/24/20 1329  Vitals shown include unvalidated device data.  Last Pain:  Vitals:   03/24/20 1107  TempSrc: Oral  PainSc: 0-No pain         Complications: No complications documented.

## 2020-03-24 NOTE — Op Note (Signed)
[facility] Patient Name: Madison [name] Procedure Date: 03/24/2020 MRN: [id number] Attending MD: Mickeal Skinner MD, MD Date of Birth: 09/23/1975 CSN: [id number] Age: 44 Admit Type: Outpatient Procedure:                Upper GI endoscopy Indications:              Chronic gastrojejunal ulcer Providers:                Arta Bruce Neveen Daponte MD, MD, Cleda Daub, RN,                            Doristine Johns, RN, Tyrone Apple, Technician,                            Courtney Heys Armistead, CRNA Referring MD:              Medicines:                Propofol per Anesthesia Complications:            No immediate complications. Estimated Blood Loss:     Estimated blood loss: none. Procedure:                Pre-Anesthesia Assessment:                           - Prior to the procedure, a History and Physical                            was performed, and patient medications and                            allergies were reviewed. The patient is competent.                            The risks and benefits of the procedure and the                            sedation options and risks were discussed with the                            patient. All questions were answered and informed                            consent was obtained. Patient identification and                            proposed procedure were verified by the physician,                            the nurse and the anesthetist in the pre-procedure                            area. Mental Status Examination: alert and  oriented. Airway Examination: normal oropharyngeal                            airway and neck mobility. Respiratory Examination:                            clear to auscultation. CV Examination: normal.                            Prophylactic Antibiotics: The patient does not                            require prophylactic antibiotics. Prior                             Anticoagulants: The patient has taken no previous                            anticoagulant or antiplatelet agents. ASA Grade                            Assessment: II - A patient with mild systemic                            disease. After reviewing the risks and benefits,                            the patient was deemed in satisfactory condition to                            undergo the procedure. The anesthesia plan was to                            use minimal sedation / analgesia (anxiolysis).                            Immediately prior to administration of medications,                            the patient was re-assessed for adequacy to receive                            sedatives. The heart rate, respiratory rate, oxygen                            saturations, blood pressure, adequacy of pulmonary                            ventilation, and response to care were monitored                            throughout the procedure. The physical status of  the patient was re-assessed after the procedure.                           After obtaining informed consent, the endoscope was                            passed under direct vision. Throughout the                            procedure, the patient's blood pressure, pulse, and                            oxygen saturations were monitored continuously. The                            GIF-H190 ([id number]) Olympus gastroscope was                            introduced through the mouth, and advanced to the                            afferent and efferent jejunal loops. The upper GI                            endoscopy was accomplished without difficulty. The                            patient tolerated the procedure well. Scope In: Scope Out: Findings:      The examined esophagus was normal.      The cardia was normal.      One non-bleeding superficial ulcer with no stigmata of bleeding was       found distal to the  gastrojejunal anastomosis. The lesion was 8 mm in       largest dimension. Impression:               - Normal esophagus.                           - Normal cardia.                           - Non-bleeding jejunal ulcer with no stigmata of                            bleeding.                           - No specimens collected.                           - Jejunal ulcer. Moderate Sedation:      Moderate (conscious) sedation was personally administered by an       anesthesia professional. The following parameters were monitored: oxygen       saturation, heart rate, blood pressure, and response to care. Total       physician intraservice time was 5 minutes. Recommendation:           -  Discharge patient to home.                           - Use Prilosec (omeprazole) 20 mg PO BID. 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 All rights reserved. The codes documented in this report are preliminary and upon coder review may  be revised to meet current compliance requirements.  [name], MD Arta Bruce Maloree Uplinger MD, MD 03/24/2020 1:31:04 PM This report has been signed electronically. Number of Addenda: 0

## 2020-03-24 NOTE — Anesthesia Preprocedure Evaluation (Signed)
Anesthesia Evaluation  Patient identified by MRN, date of birth, ID band Patient awake    Reviewed: Allergy & Precautions, NPO status , Patient's Chart, lab work & pertinent test results  History of Anesthesia Complications (+) PONV  Airway Mallampati: II  TM Distance: >3 FB Neck ROM: Full    Dental   Pulmonary former smoker,    Pulmonary exam normal        Cardiovascular hypertension, Normal cardiovascular exam     Neuro/Psych    GI/Hepatic GERD  Medicated and Controlled,  Endo/Other    Renal/GU      Musculoskeletal   Abdominal   Peds  Hematology   Anesthesia Other Findings   Reproductive/Obstetrics                             Anesthesia Physical Anesthesia Plan  ASA: III  Anesthesia Plan: MAC   Post-op Pain Management:    Induction: Intravenous  PONV Risk Score and Plan: 3 and Treatment may vary due to age or medical condition and Ondansetron  Airway Management Planned: Nasal Cannula  Additional Equipment:   Intra-op Plan:   Post-operative Plan:   Informed Consent: I have reviewed the patients History and Physical, chart, labs and discussed the procedure including the risks, benefits and alternatives for the proposed anesthesia with the patient or authorized representative who has indicated his/her understanding and acceptance.       Plan Discussed with: CRNA and Surgeon  Anesthesia Plan Comments:         Anesthesia Quick Evaluation

## 2020-03-24 NOTE — H&P (Signed)
Madison [name] is an 44 y.o. female.   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  Past Medical History:  Diagnosis Date  . Colitis 07/2018  . GERD (gastroesophageal reflux disease)   . Uterine fibroid   . Vomiting (bilious) following gastrointestinal surgery 01/16/2020    Past Surgical History:  Procedure Laterality Date  . ABDOMINAL HYSTERECTOMY  2013  . BIOPSY  01/18/2020   Procedure: BIOPSY;  Surgeon: Kieth Brightly Arta Bruce, MD;  Location: [facility];  Service: General;;  . ESOPHAGOGASTRODUODENOSCOPY N/A 12/04/2019   Procedure: UPPER GASTROINTESTINAL ENDOSCOPY;  Surgeon: Kieth Brightly Arta Bruce, MD;  Location: [facility];  Service: General;  Laterality: N/A;  . ESOPHAGOGASTRODUODENOSCOPY (EGD) WITH PROPOFOL N/A 01/18/2020   Procedure: ESOPHAGOGASTRODUODENOSCOPY (EGD) WITH PROPOFOL;  Surgeon: Mickeal Skinner, MD;  Location: [facility];  Service: General;  Laterality: N/A;  . GASTRIC ROUX-EN-Y N/A 12/04/2019   Procedure: LAPAROSCOPIC ROUX-EN-Y GASTRIC BYPASS;  Surgeon: Kieth Brightly Arta Bruce, MD;  Location: [facility];  Service: General;  Laterality: N/A;    Family History  Problem Relation Age of Onset  . Hypertension Mother   . Hypertension Sister    Social History:  reports that she quit smoking about 6 months ago. Her smoking use included cigarettes. She has a 22.00 pack-year smoking history. She has never used smokeless tobacco. She reports previous alcohol use. She reports that she does not use drugs.  Allergies:  Allergies  Allergen Reactions  . Oxycodone Nausea Only  . Adhesive [Tape] Rash    Medications Prior to Admission  Medication Sig Dispense Refill  . AQUEOUS VITAMIN D 10 MCG/ML LIQD Take 15 mLs by mouth daily.    Marland Kitchen KLOR-CON M20 20 MEQ tablet Take 20 mEq by mouth daily.    . magnesium oxide (MAG-OX) 400 MG tablet Take 1 tablet by mouth daily.    .  pantoprazole (PROTONIX) 40 MG tablet Take 1 tablet (40 mg total) by mouth 2 (two) times daily for 60 doses. 60 tablet 0  . promethazine (PHENERGAN) 12.5 MG tablet Take 1 tablet (12.5 mg total) by mouth every 6 (six) hours as needed for nausea or vomiting. 30 tablet 0  . sucralfate (CARAFATE) 1 GM/10ML suspension Take 10 mLs (1 g total) by mouth 4 (four) times daily -  with meals and at bedtime. 420 mL 0  . misoprostol (CYTOTEC) 200 MCG tablet Take 1 tablet (200 mcg total) by mouth 4 (four) times daily. (Patient not taking: Reported on 03/19/2020) 120 tablet 0  . ondansetron (ZOFRAN-ODT) 4 MG disintegrating tablet Take 1 tablet (4 mg total) by mouth every 6 (six) hours as needed for nausea. (Patient not taking: Reported on 03/19/2020) 20 tablet 0    No results found for this or any previous visit (from the past 48 hour(s)). No results found.  Review of Systems  Constitutional: Negative for chills and fever.  HENT: Negative for hearing loss.   Respiratory: Negative for cough.   Cardiovascular: Negative for chest pain and palpitations.  Gastrointestinal: Negative for abdominal pain, nausea and vomiting.  Genitourinary: Negative for dysuria and urgency.  Musculoskeletal: Negative for myalgias and neck pain.  Skin: Negative for rash.  Neurological: Negative for dizziness and headaches.  Hematological: Does not bruise/bleed easily.  Psychiatric/Behavioral: Negative for suicidal ideas.    Blood pressure (!) 146/103, pulse 75, temperature 98.4 F (36.9 C), temperature source Oral, resp. rate 17, weight 90.3  kg, last menstrual period 01/17/2012, SpO2 100 %. Physical Exam Vitals reviewed.  Constitutional:      Appearance: She is well-developed.  HENT:     Head: Normocephalic and atraumatic.  Eyes:     Conjunctiva/sclera: Conjunctivae normal.     Pupils: Pupils are equal, round, and reactive to light.  Cardiovascular:     Rate and Rhythm: Normal rate and regular rhythm.  Pulmonary:      Effort: Pulmonary effort is normal.     Breath sounds: Normal breath sounds.  Abdominal:     General: Bowel sounds are normal. There is no distension.     Palpations: Abdomen is soft.     Tenderness: There is no abdominal tenderness.  Musculoskeletal:        General: Normal range of motion.     Cervical back: Normal range of motion and neck supple.  Skin:    General: Skin is warm and dry.  Neurological:     Mental Status: She is alert and oriented to person, place, and time.  Psychiatric:        Behavior: Behavior normal.      Assessment/Plan 44 yo female with marginal ulcer -upper endoscopy -outpatient procedure  Mickeal Skinner, MD 03/24/2020, 1:05 PM

## 2020-03-24 NOTE — Discharge Instructions (Signed)
Upper Endoscopy, Adult, Care After  This sheet gives you information about how to care for yourself after your procedure. Your health care provider may also give you more specific instructions. If you have problems or questions, contact your health care provider.  What can I expect after the procedure?  After the procedure, it is common to have:  · A sore throat.  · Mild stomach pain or discomfort.  · Bloating.  · Nausea.  Follow these instructions at home:    · Follow instructions from your health care provider about what to eat or drink after your procedure.  · Return to your normal activities as told by your health care provider. Ask your health care provider what activities are safe for you.  · Take over-the-counter and prescription medicines only as told by your health care provider.  · Do not drive for 24 hours if you were given a sedative during your procedure.  · Keep all follow-up visits as told by your health care provider. This is important.  Contact a health care provider if you have:  · A sore throat that lasts longer than one day.  · Trouble swallowing.  Get help right away if:  · You vomit blood or your vomit looks like coffee grounds.  · You have:  ? A fever.  ? Bloody, black, or tarry stools.  ? A severe sore throat or you cannot swallow.  ? Difficulty breathing.  ? Severe pain in your chest or abdomen.  Summary  · After the procedure, it is common to have a sore throat, mild stomach discomfort, bloating, and nausea.  · Do not drive for 24 hours if you were given a sedative during the procedure.  · Follow instructions from your health care provider about what to eat or drink after your procedure.  · Return to your normal activities as told by your health care provider.  This information is not intended to replace advice given to you by your health care provider. Make sure you discuss any questions you have with your health care provider.  Document Revised: 10/11/2017 Document Reviewed:  09/19/2017  Elsevier Patient Education © 2020 [employer]

## 2020-03-26 ENCOUNTER — Encounter (HOSPITAL_COMMUNITY): Payer: Self-pay | Admitting: General Surgery

## 2020-03-26 NOTE — Addendum Note (Signed)
Addendum  created 03/26/20 2233 by Lillia Abed, MD   Attestation recorded in [place], [place] filed

## 2020-06-10 ENCOUNTER — Ambulatory Visit: Payer: 59

## 2020-06-14 ENCOUNTER — Encounter (HOSPITAL_COMMUNITY)
Admission: EM | Disposition: A | Discharge status: Home / Self Care | Payer: Self-pay | Source: Home / Self Care | Attending: Emergency Medicine

## 2020-06-14 ENCOUNTER — Ambulatory Visit (HOSPITAL_COMMUNITY)
Admission: EM | Admit: 2020-06-14 | Discharge: 2020-06-14 | Disposition: A | Discharge status: Home / Self Care | Payer: Self-pay | Attending: Emergency Medicine | Admitting: Emergency Medicine

## 2020-06-14 ENCOUNTER — Encounter (HOSPITAL_COMMUNITY): Payer: Self-pay | Admitting: Emergency Medicine

## 2020-06-14 ENCOUNTER — Other Ambulatory Visit: Payer: Self-pay

## 2020-06-14 ENCOUNTER — Emergency Department (HOSPITAL_COMMUNITY): Payer: Self-pay | Admitting: Registered Nurse

## 2020-06-14 ENCOUNTER — Emergency Department (HOSPITAL_COMMUNITY): Payer: Self-pay

## 2020-06-14 DIAGNOSIS — Z91048 Other nonmedicinal substance allergy status: Secondary | ICD-10-CM | POA: Insufficient documentation

## 2020-06-14 DIAGNOSIS — Z20822 Contact with and (suspected) exposure to covid-19: Secondary | ICD-10-CM | POA: Insufficient documentation

## 2020-06-14 DIAGNOSIS — Z87891 Personal history of nicotine dependence: Secondary | ICD-10-CM | POA: Insufficient documentation

## 2020-06-14 DIAGNOSIS — Z9884 Bariatric surgery status: Secondary | ICD-10-CM | POA: Insufficient documentation

## 2020-06-14 DIAGNOSIS — K3580 Unspecified acute appendicitis: Secondary | ICD-10-CM | POA: Insufficient documentation

## 2020-06-14 HISTORY — PX: LAPAROSCOPIC APPENDECTOMY: SHX408

## 2020-06-14 LAB — COMPREHENSIVE METABOLIC PANEL
ALT: 17 U/L (ref 0–44)
AST: 16 U/L (ref 15–41)
Albumin: 3.4 g/dL — ABNORMAL LOW (ref 3.5–5.0)
Alkaline Phosphatase: 85 U/L (ref 38–126)
Anion gap: 11 (ref 5–15)
BUN: 9 mg/dL (ref 6–20)
CO2: 24 mmol/L (ref 22–32)
Calcium: 9 mg/dL (ref 8.9–10.3)
Chloride: 99 mmol/L (ref 98–111)
Creatinine, Ser: 0.63 mg/dL (ref 0.44–1.00)
GFR, Estimated: >60 mL/min (ref >60)
Glucose, Bld: 112 mg/dL — ABNORMAL HIGH (ref 70–99)
Potassium: 4 mmol/L (ref 3.5–5.1)
Sodium: 134 mmol/L — ABNORMAL LOW (ref 135–145)
Total Bilirubin: 0.8 mg/dL (ref 0.3–1.2)
Total Protein: 7.9 g/dL (ref 6.5–8.1)

## 2020-06-14 LAB — CBC
HCT: 38.4 % (ref 36.0–46.0)
Hemoglobin: 12.7 g/dL (ref 12.0–15.0)
MCH: 26.6 pg (ref 26.0–34.0)
MCHC: 33.1 g/dL (ref 30.0–36.0)
MCV: 80.5 fL (ref 80.0–100.0)
Platelets: 426 10*3/uL — ABNORMAL HIGH (ref 150–400)
RBC: 4.77 MIL/uL (ref 3.87–5.11)
RDW: 14 % (ref 11.5–15.5)
WBC: 20.7 10*3/uL — ABNORMAL HIGH (ref 4.0–10.5)
nRBC: 0 % (ref 0.0–0.2)

## 2020-06-14 LAB — RESP PANEL BY RT-PCR (FLU A&B, COVID) ARPGX2
Influenza A by PCR: NEGATIVE
Influenza B by PCR: NEGATIVE
SARS Coronavirus 2 by RT PCR: NEGATIVE

## 2020-06-14 LAB — I-STAT BETA HCG BLOOD, ED (MC, WL, AP ONLY): I-stat hCG, quantitative: <5 m[IU]/mL (ref <5)

## 2020-06-14 LAB — URINALYSIS, ROUTINE W REFLEX MICROSCOPIC
Bilirubin Urine: NEGATIVE
Glucose, UA: NEGATIVE mg/dL
Hgb urine dipstick: NEGATIVE
Ketones, ur: 20 mg/dL — AB
Leukocytes,Ua: NEGATIVE
Nitrite: NEGATIVE
Protein, ur: NEGATIVE mg/dL
Specific Gravity, Urine: 1.032 — ABNORMAL HIGH (ref 1.005–1.030)
pH: 5 (ref 5.0–8.0)

## 2020-06-14 LAB — LIPASE, BLOOD: Lipase: 24 U/L (ref 11–51)

## 2020-06-14 SURGERY — APPENDECTOMY, LAPAROSCOPIC
Anesthesia: General | Site: Abdomen

## 2020-06-14 MED ORDER — BUPIVACAINE-EPINEPHRINE (PF) 0.25% -1:200000 IJ SOLN
INTRAMUSCULAR | Status: AC
  Filled 2020-06-14: qty 30

## 2020-06-14 MED ORDER — PANTOPRAZOLE SODIUM 40 MG PO TBEC: 40.0000 mg | DELAYED_RELEASE_TABLET | Freq: Every day | ORAL | 5 refills | Status: DC

## 2020-06-14 MED ORDER — MIDAZOLAM HCL 5 MG/5ML IJ SOLN
INTRAMUSCULAR | Status: DC | PRN
  Administered 2020-06-14: 2 mg via INTRAVENOUS

## 2020-06-14 MED ORDER — FENTANYL CITRATE (PF) 100 MCG/2ML IJ SOLN
25.0000 ug | INTRAMUSCULAR | Status: DC | PRN
  Administered 2020-06-14 (×2): 50 ug via INTRAVENOUS

## 2020-06-14 MED ORDER — MIDAZOLAM HCL 2 MG/2ML IJ SOLN
INTRAMUSCULAR | Status: AC
  Filled 2020-06-14: qty 2

## 2020-06-14 MED ORDER — PHENYLEPHRINE HCL (PRESSORS) 10 MG/ML IV SOLN
INTRAVENOUS | Status: DC | PRN
  Administered 2020-06-14: 80 ug via INTRAVENOUS

## 2020-06-14 MED ORDER — 0.9 % SODIUM CHLORIDE (POUR BTL) OPTIME
TOPICAL | Status: DC | PRN
  Administered 2020-06-14: 1000 mL

## 2020-06-14 MED ORDER — HYDROCODONE-ACETAMINOPHEN 5-325 MG PO TABS
ORAL_TABLET | ORAL | Status: AC
  Filled 2020-06-14: qty 1

## 2020-06-14 MED ORDER — FENTANYL CITRATE (PF) 250 MCG/5ML IJ SOLN
INTRAMUSCULAR | Status: AC
  Filled 2020-06-14: qty 5

## 2020-06-14 MED ORDER — PROPOFOL 10 MG/ML IV BOLUS
INTRAVENOUS | Status: DC | PRN
  Administered 2020-06-14: 170 mg via INTRAVENOUS
  Administered 2020-06-14: 30 mg via INTRAVENOUS
  Administered 2020-06-14: 20 mg via INTRAVENOUS

## 2020-06-14 MED ORDER — LACTATED RINGERS IV SOLN: INTRAVENOUS | Status: DC | PRN

## 2020-06-14 MED ORDER — MORPHINE SULFATE (PF) 4 MG/ML IV SOLN
4.0000 mg | Freq: Once | INTRAVENOUS | Status: AC
  Administered 2020-06-14: 4 mg via INTRAVENOUS
  Filled 2020-06-14: qty 1

## 2020-06-14 MED ORDER — PROPOFOL 10 MG/ML IV BOLUS
INTRAVENOUS | Status: AC
  Filled 2020-06-14: qty 20

## 2020-06-14 MED ORDER — ONDANSETRON HCL 4 MG/2ML IJ SOLN
INTRAMUSCULAR | Status: DC | PRN
  Administered 2020-06-14: 4 mg via INTRAVENOUS

## 2020-06-14 MED ORDER — LACTATED RINGERS IV BOLUS
1000.0000 mL | Freq: Once | INTRAVENOUS | Status: AC
  Administered 2020-06-14: 1000 mL via INTRAVENOUS

## 2020-06-14 MED ORDER — LIDOCAINE 2% (20 MG/ML) 5 ML SYRINGE
INTRAMUSCULAR | Status: DC | PRN
  Administered 2020-06-14: 50 mg via INTRAVENOUS

## 2020-06-14 MED ORDER — HYDROMORPHONE HCL 1 MG/ML IJ SOLN
1.0000 mg | Freq: Once | INTRAMUSCULAR | Status: AC
  Administered 2020-06-14: 1 mg via INTRAVENOUS
  Filled 2020-06-14: qty 1

## 2020-06-14 MED ORDER — HYDROCODONE-ACETAMINOPHEN 5-325 MG PO TABS
1.0000 | ORAL_TABLET | Freq: Once | ORAL | Status: AC
  Administered 2020-06-14: 1 via ORAL

## 2020-06-14 MED ORDER — CHLORHEXIDINE GLUCONATE CLOTH 2 % EX PADS
6.0000 | MEDICATED_PAD | Freq: Once | CUTANEOUS | Status: AC
  Administered 2020-06-14: 6 via TOPICAL

## 2020-06-14 MED ORDER — ONDANSETRON HCL 4 MG/2ML IJ SOLN
4.0000 mg | Freq: Four times a day (QID) | INTRAMUSCULAR | Status: DC | PRN
Start: 2020-06-14 — End: 2020-06-14

## 2020-06-14 MED ORDER — ROCURONIUM BROMIDE 10 MG/ML (PF) SYRINGE
PREFILLED_SYRINGE | INTRAVENOUS | Status: DC | PRN
  Administered 2020-06-14: 50 mg via INTRAVENOUS
  Administered 2020-06-14: 10 mg via INTRAVENOUS

## 2020-06-14 MED ORDER — FENTANYL CITRATE (PF) 100 MCG/2ML IJ SOLN
INTRAMUSCULAR | Status: DC | PRN
  Administered 2020-06-14: 50 ug via INTRAVENOUS
  Administered 2020-06-14: 100 ug via INTRAVENOUS
  Administered 2020-06-14 (×2): 50 ug via INTRAVENOUS
  Administered 2020-06-14: 100 ug via INTRAVENOUS

## 2020-06-14 MED ORDER — PIPERACILLIN-TAZOBACTAM 3.375 G IVPB 30 MIN
3.3750 g | Freq: Once | INTRAVENOUS | Status: AC
  Administered 2020-06-14: 3.375 g via INTRAVENOUS
  Filled 2020-06-14: qty 50

## 2020-06-14 MED ORDER — SUCCINYLCHOLINE CHLORIDE 200 MG/10ML IV SOSY
PREFILLED_SYRINGE | INTRAVENOUS | Status: DC | PRN
  Administered 2020-06-14: 120 mg via INTRAVENOUS

## 2020-06-14 MED ORDER — BUPIVACAINE-EPINEPHRINE 0.25% -1:200000 IJ SOLN
INTRAMUSCULAR | Status: DC | PRN
  Administered 2020-06-14: 30 mL

## 2020-06-14 MED ORDER — FENTANYL CITRATE (PF) 100 MCG/2ML IJ SOLN
INTRAMUSCULAR | Status: AC
  Filled 2020-06-14: qty 2

## 2020-06-14 MED ORDER — DEXAMETHASONE SODIUM PHOSPHATE 10 MG/ML IJ SOLN
INTRAMUSCULAR | Status: DC | PRN
  Administered 2020-06-14: 8 mg via INTRAVENOUS

## 2020-06-14 MED ORDER — HYDROCODONE-ACETAMINOPHEN 5-325 MG PO TABS: 1.0000 | ORAL_TABLET | Freq: Four times a day (QID) | ORAL | 0 refills | Status: AC | PRN

## 2020-06-14 MED ORDER — LABETALOL HCL 5 MG/ML IV SOLN
INTRAVENOUS | Status: DC | PRN
  Administered 2020-06-14 (×2): 5 mg via INTRAVENOUS
  Administered 2020-06-14: 2.5 mg via INTRAVENOUS

## 2020-06-14 MED ORDER — LACTATED RINGERS IV SOLN
INTRAVENOUS | Status: DC | PRN
  Administered 2020-06-14: 1000 mL

## 2020-06-14 MED ORDER — SUGAMMADEX SODIUM 200 MG/2ML IV SOLN
INTRAVENOUS | Status: DC | PRN
  Administered 2020-06-14: 200 mg via INTRAVENOUS

## 2020-06-14 MED ORDER — ONDANSETRON HCL 4 MG/2ML IJ SOLN
4.0000 mg | Freq: Once | INTRAMUSCULAR | Status: AC
  Administered 2020-06-14: 4 mg via INTRAVENOUS
  Filled 2020-06-14: qty 2

## 2020-06-14 MED ORDER — IOHEXOL 300 MG/ML  SOLN
100.0000 mL | Freq: Once | INTRAMUSCULAR | Status: AC | PRN
  Administered 2020-06-14: 100 mL via INTRAVENOUS

## 2020-06-14 SURGICAL SUPPLY — 48 items
APPLIER CLIP 5 13 M/L LIGAMAX5 (MISCELLANEOUS)
APPLIER CLIP ROT 10 11.4 M/L (STAPLE)
CABLE HIGH FREQUENCY MONO STRZ (ELECTRODE) ×2 IMPLANT
CHLORAPREP W/TINT 26 (MISCELLANEOUS) ×2 IMPLANT
CLIP APPLIE 5 13 M/L LIGAMAX5 (MISCELLANEOUS) IMPLANT
CLIP APPLIE ROT 10 11.4 M/L (STAPLE) IMPLANT
COVER SURGICAL LIGHT HANDLE (MISCELLANEOUS) ×2 IMPLANT
COVER WAND RF STERILE (DRAPES) IMPLANT
CUTTER ECHEON FLEX ENDO 45 340 (ENDOMECHANICALS) ×2 IMPLANT
CUTTER FLEX LINEAR 45M (STAPLE) IMPLANT
DECANTER SPIKE VIAL GLASS SM (MISCELLANEOUS) ×2 IMPLANT
DERMABOND ADVANCED (GAUZE/BANDAGES/DRESSINGS) ×1
DERMABOND ADVANCED .7 DNX12 (GAUZE/BANDAGES/DRESSINGS) ×1 IMPLANT
DRAIN CHANNEL 19F RND (DRAIN) IMPLANT
ELECT REM PT RETURN 15FT ADLT (MISCELLANEOUS) ×2 IMPLANT
ENDOLOOP SUT PDS II  0 18 (SUTURE)
ENDOLOOP SUT PDS II 0 18 (SUTURE) IMPLANT
EVACUATOR SILICONE 100CC (DRAIN) IMPLANT
GLOVE SURG ENC MOIS LTX SZ6 (GLOVE) ×2 IMPLANT
GLOVE SURG UNDER LTX SZ6.5 (GLOVE) ×2 IMPLANT
GOWN STRL REUS W/ TWL LRG LVL3 (GOWN DISPOSABLE) ×1 IMPLANT
GOWN STRL REUS W/TWL LRG LVL3 (GOWN DISPOSABLE) ×2
GOWN STRL REUS W/TWL XL LVL3 (GOWN DISPOSABLE) ×2 IMPLANT
GRASPER SUT TROCAR 14GX15 (MISCELLANEOUS) ×2 IMPLANT
IRRIG SUCT STRYKERFLOW 2 WTIP (MISCELLANEOUS) ×2
IRRIGATION SUCT STRKRFLW 2 WTP (MISCELLANEOUS) ×1 IMPLANT
KIT BASIN OR (CUSTOM PROCEDURE TRAY) ×2 IMPLANT
KIT TURNOVER KIT A (KITS) ×2 IMPLANT
NEEDLE INSUFFLATION 14GA 120MM (NEEDLE) ×2 IMPLANT
PENCIL SMOKE EVACUATOR (MISCELLANEOUS) IMPLANT
POUCH SPECIMEN RETRIEVAL 10MM (ENDOMECHANICALS) ×2 IMPLANT
RELOAD 45 VASCULAR/THIN (ENDOMECHANICALS) IMPLANT
RELOAD STAPLE TA45 3.5 REG BLU (ENDOMECHANICALS) IMPLANT
SCISSORS LAP 5X35 DISP (ENDOMECHANICALS) ×2 IMPLANT
SET TUBE SMOKE EVAC HIGH FLOW (TUBING) ×2 IMPLANT
SHEARS HARMONIC ACE PLUS 36CM (ENDOMECHANICALS) ×2 IMPLANT
SLEEVE XCEL OPT CAN 5 100 (ENDOMECHANICALS) ×2 IMPLANT
STAPLE RELOAD 45 WHT (STAPLE) ×1 IMPLANT
STAPLE RELOAD 45MM WHITE (STAPLE) ×2
SUT ETHILON 2 0 PS N (SUTURE) IMPLANT
SUT MNCRL AB 4-0 PS2 18 (SUTURE) ×2 IMPLANT
TOWEL OR 17X26 10 PK STRL BLUE (TOWEL DISPOSABLE) ×2 IMPLANT
TOWEL OR NON WOVEN STRL DISP B (DISPOSABLE) ×2 IMPLANT
TRAY FOLEY MTR SLVR 14FR STAT (SET/KITS/TRAYS/PACK) IMPLANT
TRAY FOLEY MTR SLVR 16FR STAT (SET/KITS/TRAYS/PACK) IMPLANT
TRAY LAPAROSCOPIC (CUSTOM PROCEDURE TRAY) ×2 IMPLANT
TROCAR BLADELESS OPT 5 100 (ENDOMECHANICALS) ×2 IMPLANT
TROCAR XCEL 12X100 BLDLESS (ENDOMECHANICALS) ×2 IMPLANT

## 2020-06-14 NOTE — ED Provider Notes (Signed)
Emergency Department Provider Note   I have reviewed the triage vital signs and the nursing notes.   HISTORY  Chief Complaint Abdominal Pain, Nausea, and Emesis   HPI Madison [name] is a 45 y.o. female with past medical history of obesity s/p laparoscopic Roux-en-Y gastric bypass w/ Dr. Kieth Brightly presents emergency department with dry heaves and abdominal pain.  She has had some postoperative dehydration in the past requiring admit and gastric ulcer on EGD but no h/o internal hernia or SBO.  Patient is passing flatus and denies actual vomiting but is having significant dry heaving.  She states is limiting her PO intake over the last 24 hours.  She denies any fevers.  No chest pain.  Denies shortness of breath.  No blood in the emesis or stool. No nausea medication at home to try for symptoms.   Past Medical History:  Diagnosis Date  . Colitis 07/2018  . GERD (gastroesophageal reflux disease)   . Uterine fibroid   . Vomiting (bilious) following gastrointestinal surgery 01/16/2020    Patient Active Problem List   Diagnosis Date Noted  . Vomiting (bilious) following gastrointestinal surgery 01/16/2020  . Dehydration 12/10/2019  . Morbid obesity ([place]) 12/04/2019  . Obesity 07/13/2019  . Essential hypertension 11/09/2018  . Dysuria 11/09/2018  . Colitis 11/08/2018  . Morbid obesity with BMI of 40.0-44.9, adult ([place]) 09/12/2014  . Bilateral low back pain with left-sided sciatica 08/13/2014    Past Surgical History:  Procedure Laterality Date  . ABDOMINAL HYSTERECTOMY  2013  . BIOPSY  01/18/2020   Procedure: BIOPSY;  Surgeon: Kieth Brightly Arta Bruce, MD;  Location: [facility];  Service: General;;  . ESOPHAGOGASTRODUODENOSCOPY N/A 12/04/2019   Procedure: UPPER GASTROINTESTINAL ENDOSCOPY;  Surgeon: Kieth Brightly Arta Bruce, MD;  Location: [facility];  Service: General;  Laterality: N/A;  . ESOPHAGOGASTRODUODENOSCOPY N/A 03/24/2020   Procedure: ESOPHAGOGASTRODUODENOSCOPY (EGD);   Surgeon: Kieth Brightly, Arta Bruce, MD;  Location: [facility];  Service: General;  Laterality: N/A;  . ESOPHAGOGASTRODUODENOSCOPY (EGD) WITH PROPOFOL N/A 01/18/2020   Procedure: ESOPHAGOGASTRODUODENOSCOPY (EGD) WITH PROPOFOL;  Surgeon: Kieth Brightly Arta Bruce, MD;  Location: [facility];  Service: General;  Laterality: N/A;  . GASTRIC ROUX-EN-Y N/A 12/04/2019   Procedure: LAPAROSCOPIC ROUX-EN-Y GASTRIC BYPASS;  Surgeon: Kieth Brightly Arta Bruce, MD;  Location: [facility];  Service: General;  Laterality: N/A;    Allergies Oxycodone and Adhesive [tape]  Family History  Problem Relation Age of Onset  . Hypertension Mother   . Hypertension Sister     Social History Social History   Tobacco Use  . Smoking status: Former Smoker    Packs/day: 1.00    Years: 22.00    Pack years: 22.00    Types: Cigarettes    Quit date: 08/31/2019    Years since quitting: 0.7  . Smokeless tobacco: Never Used  Vaping Use  . Vaping Use: Never used  Substance Use Topics  . Alcohol use: Not Currently    Comment: none recent  . Drug use: No    Review of Systems  Constitutional: No fever/chills Eyes: No visual changes. ENT: No sore throat. Cardiovascular: Denies chest pain. Respiratory: Denies shortness of breath. Gastrointestinal: Positive abdominal pain. Positive nausea and dry heaving.  No diarrhea.  No constipation. Genitourinary: Negative for dysuria. Musculoskeletal: Negative for back pain. Skin: Negative for rash. Neurological: Negative for headaches, focal weakness or numbness.  10-point ROS otherwise negative.  ____________________________________________   PHYSICAL EXAM:  VITAL SIGNS: ED Triage Vitals  Enc Vitals Group  BP 06/14/20 0500 (!) 149/112     Pulse Rate 06/14/20 0500 89     Resp --      Temp 06/14/20 0500 98 F (36.7 C)     Temp Source 06/14/20 0500 Oral     SpO2 06/14/20 0500 100 %   Constitutional: Alert and oriented. Well appearing and in no acute distress. Eyes:  Conjunctivae are normal.  Head: Atraumatic. Nose: No congestion/rhinnorhea. Mouth/Throat: Mucous membranes are moist.   Neck: No stridor.   Cardiovascular: Normal rate, regular rhythm. Good peripheral circulation. Grossly normal heart sounds.   Respiratory: Normal respiratory effort.  No retractions. Lungs CTAB. Gastrointestinal: Soft with mild diffuse tenderness. No rebound or guarding. No distention.  Musculoskeletal: No gross deformities of extremities. Neurologic:  Normal speech and language.  Skin:  Skin is warm, dry and intact. No rash noted.   ____________________________________________   LABS (all labs ordered are listed, but only abnormal results are displayed)  Labs Reviewed  COMPREHENSIVE METABOLIC PANEL - Abnormal; Notable for the following components:      Result Value   Sodium 134 (*)    Glucose, Bld 112 (*)    Albumin 3.4 (*)    All other components within normal limits  CBC - Abnormal; Notable for the following components:   WBC 20.7 (*)    Platelets 426 (*)    All other components within normal limits  RESP PANEL BY RT-PCR (FLU A&B, COVID) ARPGX2  LIPASE, BLOOD  URINALYSIS, ROUTINE W REFLEX MICROSCOPIC  I-STAT BETA HCG BLOOD, ED ([facility], [facility], [facility] ONLY)   ____________________________________________  RADIOLOGY  CT ABDOMEN PELVIS W CONTRAST  Result Date: 06/14/2020 CLINICAL DATA:  Nausea, vomiting, abdominal pain nonlocalized. EXAM: CT ABDOMEN AND PELVIS WITH CONTRAST TECHNIQUE: Multidetector CT imaging of the abdomen and pelvis was performed using the standard protocol following bolus administration of intravenous contrast. CONTRAST:  191mL OMNIPAQUE IOHEXOL 300 MG/ML  SOLN COMPARISON:  CT abdomen pelvis 11/24/2018 FINDINGS: Lower chest: No acute abnormality. Hepatobiliary: No focal liver abnormality. No gallstones, gallbladder wall thickening, or pericholecystic fluid. No biliary dilatation. Pancreas: No focal lesion. Normal pancreatic contour. No surrounding  inflammatory changes. No main pancreatic ductal dilatation. Spleen: Normal in size without focal abnormality. Adrenals/Urinary Tract: No adrenal nodule bilaterally. Bilateral kidneys enhance symmetrically. No hydronephrosis. No hydroureter. The urinary bladder is unremarkable. 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 Stomach is within normal limits. No evidence of bowel wall thickening or dilatation. Appendix: The appendix is enlarged in caliber measuring up to 1.8 cm. Associated appendicoliths is noted within its lumen at its base. Associated periappendiceal fat stranding. The appendix is noted medial to the cecum entering the pelvis (2: 50-62). No definite appendiceal wall discontinuity. Vascular/Lymphatic: No abdominal aorta or iliac aneurysm. Trace atherosclerotic plaque of the aorta and its branches. No abdominal, pelvic, or inguinal lymphadenopathy. Reproductive: Status post hysterectomy. No adnexal masses. Other: Vague fat stranding along the anterior right/mid abdominal wall adjacent to the right hepatic lobe and transverse colon (5:40 2:35) unclear etiology. No intraperitoneal free fluid. No intraperitoneal free gas. No organized fluid collection. Musculoskeletal: No abdominal wall hernia or abnormality No suspicious lytic or blastic osseous lesions. No acute displaced fracture. L5-S1 degenerative changes. IMPRESSION: 1. Non-perforated acute appendicitis with associated appendicolith at its base. 2. Vague fat stranding along the anterior right/mid abdominal wall adjacent to the right hepatic lobe and transverse colon unclear etiology. Findings could be related to postsurgical changes in a patient with interval Roux-en-Y gastric bypass. Electronically Signed  By: Iven Finn M.D.   On: 06/14/2020 06:54    ____________________________________________   PROCEDURES  Procedure(s) performed:   Procedures  None   ____________________________________________   INITIAL IMPRESSION / ASSESSMENT AND PLAN / ED COURSE  Pertinent labs & imaging results that were available during my care of the patient were reviewed by me and considered in my medical decision making (see chart for details).   Patient presents to the emergency department for evaluation of abdominal pain with dry heaving over the past 24 hours.  Vital signs on arrival significant for some mild hypertension but normal pulse and oxygen sat.  Patient is afebrile.  Abdomen is tender more diffusely but not peritoneal.  While my suspicion for bowel obstruction or internal hernia is low the patient is at increased risk for this in the postoperative setting after gastric bypass.  Plan for CT abdomen pelvis with IV contrast along with labs which are pending. IVF, zofran, and pain medication given here.   07:00 AM  CT abdomen pelvis reviewed with changes consistent with acute appendicitis.  Patient does have significant leukocytosis here.  Went back to update her on the findings of the CT scan.  Have ordered additional pain medication and antibiotics.  Will send for Covid PCR swab with anticipated OR time later today.   Discussed patient's case with General Surgery to request admission. Patient and family (if present) updated with plan. Care transferred to surgery service.  I reviewed all nursing notes, vitals, pertinent old records, EKGs, labs, imaging (as available).  ____________________________________________  FINAL CLINICAL IMPRESSION(S) / ED DIAGNOSES  Final diagnoses:  Acute appendicitis, unspecified acute appendicitis type     MEDICATIONS GIVEN DURING THIS VISIT:  Medications  piperacillin-tazobactam (ZOSYN) IVPB 3.375 g (has no administration in time range)  HYDROmorphone (DILAUDID) injection 1 mg (has no administration in time range)  lactated ringers bolus 1,000 mL (1,000 mLs Intravenous New Bag/Given 06/14/20 0616)  ondansetron  (ZOFRAN) injection 4 mg (4 mg Intravenous Given 06/14/20 0616)  morphine 4 MG/ML injection 4 mg (4 mg Intravenous Given 06/14/20 0616)  iohexol (OMNIPAQUE) 300 MG/ML solution 100 mL (100 mLs Intravenous Contrast Given 06/14/20 0631)    Note:  This document was prepared using Dragon voice recognition software and may include unintentional dictation errors.  Nanda Quinton, MD, [facility]    Love Milbourne, [facility], MD 06/14/20 [phone]

## 2020-06-14 NOTE — ED Notes (Signed)
Pt provided labeled specimen cup for urine collection. [employer]

## 2020-06-14 NOTE — ED Notes (Signed)
Pt ambulatory to restroom. Pt given new gown, non slip socks, and 3 packs of CHG wipes for bath to prep for surgery. Pt instructed how to clean herself and to change into new gown afterwards. Pt placed all of her belongings in pt belongings bags.

## 2020-06-14 NOTE — Anesthesia Procedure Notes (Signed)
Procedure Name: Intubation Date/Time: 06/14/2020 9:40 AM Performed by: Lissa Morales, CRNA Pre-anesthesia Checklist: Patient identified, Emergency Drugs available, Suction available and Patient being monitored Patient Re-evaluated:Patient Re-evaluated prior to induction Oxygen Delivery Method: Circle system utilized Preoxygenation: Pre-oxygenation with 100% oxygen Induction Type: IV induction, Cricoid Pressure applied and Rapid sequence Laryngoscope Size: Mac and 4 Grade View: Grade II Tube type: Oral Number of attempts: 1 Airway Equipment and Method: Stylet and Oral airway Placement Confirmation: ETT inserted through vocal cords under direct vision,  positive ETCO2 and breath sounds checked- equal and bilateral Secured at: 22 cm Tube secured with: Tape Dental Injury: Teeth and Oropharynx as per pre-operative assessment

## 2020-06-14 NOTE — Discharge Instructions (Signed)
General Anesthesia, Adult, Care After This sheet gives you information about how to care for yourself after your procedure. Your health care provider may also give you more specific instructions. If you have problems or questions, contact your health care provider. What can I expect after the procedure? After the procedure, the following side effects are common:  Pain or discomfort at the IV site.  Nausea.  Vomiting.  Sore throat.  Trouble concentrating.  Feeling cold or chills.  Feeling weak or tired.  Sleepiness and fatigue.  Soreness and body aches. These side effects can affect parts of the body that were not involved in surgery. Follow these instructions at home: For the time period you were told by your health care provider:  Rest.  Do not participate in activities where you could fall or become injured.  Do not drive or use machinery.  Do not drink alcohol.  Do not take sleeping pills or medicines that cause drowsiness.  Do not make important decisions or sign legal documents.  Do not take care of children on your own.   Eating and drinking  Follow any instructions from your health care provider about eating or drinking restrictions.  When you feel hungry, start by eating small amounts of foods that are soft and easy to digest (bland), such as toast. Gradually return to your regular diet.  Drink enough fluid to keep your urine pale yellow.  If you vomit, rehydrate by drinking water, juice, or clear broth. General instructions  If you have sleep apnea, surgery and certain medicines can increase your risk for breathing problems. Follow instructions from your health care provider about wearing your sleep device: ? Anytime you are sleeping, including during daytime naps. ? While taking prescription pain medicines, sleeping medicines, or medicines that make you drowsy.  Have a responsible adult stay with you for the time you are told. It is important to have  someone help care for you until you are awake and alert.  Return to your normal activities as told by your health care provider. Ask your health care provider what activities are safe for you.  Take over-the-counter and prescription medicines only as told by your health care provider.  If you smoke, do not smoke without supervision.  Keep all follow-up visits as told by your health care provider. This is important. Contact a health care provider if:  You have nausea or vomiting that does not get better with medicine.  You cannot eat or drink without vomiting.  You have pain that does not get better with medicine.  You are unable to pass urine.  You develop a skin rash.  You have a fever.  You have redness around your IV site that gets worse. Get help right away if:  You have difficulty breathing.  You have chest pain.  You have blood in your urine or stool, or you vomit blood. Summary  After the procedure, it is common to have a sore throat or nausea. It is also common to feel tired.  Have a responsible adult stay with you for the time you are told. It is important to have someone help care for you until you are awake and alert.  When you feel hungry, start by eating small amounts of foods that are soft and easy to digest (bland), such as toast. Gradually return to your regular diet.  Drink enough fluid to keep your urine pale yellow.  Return to your normal activities as told by your health care provider.   Ask your health care provider what activities are safe for you. This information is not intended to replace advice given to you by your health care provider. Make sure you discuss any questions you have with your health care provider. Document Revised: 01/03/2020 Document Reviewed: 08/02/2019 Elsevier Patient Education  2021 [place] INSTRUCTIONS  Activity . No heavy lifting greater than 10 pounds for 4 weeks after  surgery. Madaline Brilliant to shower, but do not bathe or submerge incisions underwater. . Do not drive while taking narcotic pain medication.  Wound Care . Your incisions are covered with skin glue called Dermabond. This will peel off on its own over time. . You may shower and allow warm soapy water to run over your incisions. Gently pat dry. . Do not submerge your incision underwater. . Monitor your incision for any new redness, tenderness, or drainage.  When to Call us: Marland Kitchen Fever greater than 100.5 . New redness, drainage, or swelling at incision site . Severe pain, nausea, or vomiting . Jaundice (yellowing of the whites of the eyes or skin)  Follow-up You will have a follow up appointment in 3-4 weeks. This will be at the [facility] office at [address], [place], [place], [place]. Please arrive at least 15 minutes prior to your scheduled appointment time.  For questions or concerns, or to schedule or change your appointment time, please call the office at [phone].

## 2020-06-14 NOTE — Transfer of Care (Signed)
Immediate Anesthesia Transfer of Care Note  Patient: Madison [name]  Procedure(s) Performed: APPENDECTOMY LAPAROSCOPIC (N/A Abdomen)  Patient Location: PACU  Anesthesia Type:General  Level of Consciousness: awake, alert , oriented and patient cooperative  Airway & Oxygen Therapy: Patient Spontanous Breathing and Patient connected to face mask oxygen  Post-op Assessment: Report given to RN, Post -op Vital signs reviewed and stable and Patient moving all extremities X 4  Post vital signs: stable  Last Vitals:  Vitals Value Taken Time  BP 162/104 06/14/20 1103  Temp 37.2 C 06/14/20 1103  Pulse 83 06/14/20 1103  Resp 15 06/14/20 1103  SpO2 100 % 06/14/20 1103    Last Pain:  Vitals:   06/14/20 1103  TempSrc:   PainSc: Asleep         Complications: No complications documented.

## 2020-06-14 NOTE — Anesthesia Preprocedure Evaluation (Signed)
Anesthesia Evaluation  Patient identified by MRN, date of birth, ID band Patient awake    Reviewed: Allergy & Precautions, H&P , NPO status , Patient's Chart, lab work & pertinent test results  History of Anesthesia Complications (+) PONV and history of anesthetic complications  Airway Mallampati: II   Neck ROM: full    Dental   Pulmonary former smoker,    breath sounds clear to auscultation       Cardiovascular hypertension,  Rhythm:regular Rate:Normal     Neuro/Psych    GI/Hepatic GERD  ,appendicitis   Endo/Other  obese  Renal/GU      Musculoskeletal   Abdominal   Peds  Hematology   Anesthesia Other Findings   Reproductive/Obstetrics S/p hysterectomy                              Anesthesia Physical Anesthesia Plan  ASA: II  Anesthesia Plan: General   Post-op Pain Management:    Induction: Intravenous  PONV Risk Score and Plan: 4 or greater and Ondansetron, Dexamethasone, Midazolam and Treatment may vary due to age or medical condition  Airway Management Planned: Oral ETT  Additional Equipment:   Intra-op Plan:   Post-operative Plan: Extubation in OR  Informed Consent: I have reviewed the patients History and Physical, chart, labs and discussed the procedure including the risks, benefits and alternatives for the proposed anesthesia with the patient or authorized representative who has indicated his/her understanding and acceptance.     Dental advisory given  Plan Discussed with: Anesthesiologist, Surgeon and CRNA  Anesthesia Plan Comments:         Anesthesia Quick Evaluation

## 2020-06-14 NOTE — ED Triage Notes (Signed)
Patient here from home reporting abd pain, n/v that started at 8am yesterday morning.

## 2020-06-14 NOTE — Op Note (Signed)
Date: 06/14/20  Patient: Madison [name] MRN: [id number]  Preoperative Diagnosis: Acute appendicitis Postoperative Diagnosis: Same  Procedure: Laparoscopic appendectomy  Surgeon: Michaelle Birks, MD  EBL: Minimal  Anesthesia: General  Specimens: Appendix  Indications: Ms. Buren is a 45 yo female with a history of roux-en-Y gastric bypass who has been treated for marginal ulcers, who presented to the ED with 1 day of epigastric and RLQ abdominal pain. Labs were significant for a leukocytosis. CT scan showed acute appendicitis with appendicolith. She was brought to the OR for laparoscopic appendectomy.  Findings: Acute appendicitis without perforation or abscess.  Procedure details: Informed consent was obtained in the preoperative area prior to the procedure. The patient was brought to the operating room and placed on the table in the supine position. General anesthesia was induced and appropriate lines and drains were placed for intraoperative monitoring. Perioperative antibiotics were administered per SCIP guidelines. The abdomen was prepped and draped in the usual sterile fashion. A pre-procedure timeout was taken verifying patient identity, surgical site and procedure to be performed.  A small supraumbilical skin incision was made and the subcutaneous tissue was spread to expose the fascia. The umbilical stalk was grasped and elevated. A Veress needle was inserted but successful intraperitoneal placement could not be confirmed. The subcutaneous tissue was divided to expose the fascia, and there was a small 3.8SN umbilical hernia defect. This defect was extended with a scalpel and the peritoneal cavity was entered. A 80mm port was then placed. The peritoneal cavity was visualized and a 71mm Hasson trocar was inserted. The abdomen was inspected with no evidence of visceral or vascular injury. A LLQ 13mm port was placed, followed by a suprapubic 31mm port, both under direct visualization. The  appendix was identified in the RLQ, and was very inflamed and dilated, consistent with acute appendicitis. The base was normal in appearance. It was adherent to the adjacent terminal ileum, and these adhesions were sharply divided. The retroperitoneal attachments were taken down with Harmonic shears. The mesoappendix was divided with the Harmonic. The appendix was divided at the base using a 41mm stapler with a blue load. The specimen was placed in an endocatch bag and removed and sent for routine pathology. The surgical site was thoroughly irrigated. The staple line was inspected and appeared in tact with no bleeding or leakage. The divided mesoappendix was hemostatic. The ports were removed and the pneumoperitoneum was evacuated. The umbilical port site fascia was closed with an 0 Vicryl suture. The skin at all port sites was closed with 4-0 monocryl subcuticular suture. Dermabond was applied.  The patient tolerated the procedure well with no apparent complications. All counts were correct x2 at the end of the procedure. The patient was extubated and taken to PACU in stable condition.  Michaelle Birks, MD 06/14/20 11:03 AM

## 2020-06-14 NOTE — H&P (Signed)
Madison [name] [birth year]  [id number].    Requesting [name]: Dr. Laverta [name] Chief Complaint/Reason for Consult: Appendicitis  HPI:  Madison [name] is a 45 yo female who presented to the ED with acute abdominal pain. The pain began yesterday morning, and she subsequently developed vomiting. It has gotten worse and is located in the upper abdomen and RLQ. She has a history of gastric bypass surgery by Dr. Kieth [name] in August 2021. She has known marginal ulcers, confirmed on EGD on 03/25/20. She reports she has not been taking her PPI anymore because she did not think she needs it.  She presented to the ED overnight due to worsening pain. WBC was 20 and labs were otherwise unremarkable. CT abd/pelvis showed acute appendicitis. General surgery was consulted. Patient has been given a dose of Zosyn in the ED.  ROS: Review of Systems  Constitutional: Negative for fever.  HENT: Negative for sore throat.   Eyes: Negative for redness.  Respiratory: Negative for shortness of breath and wheezing.   Cardiovascular: Negative for chest pain.  Gastrointestinal: Positive for abdominal pain, nausea and vomiting.  Genitourinary: Negative for dysuria.  Musculoskeletal: Negative for falls.  Skin: Negative for itching.  Neurological: Negative for speech change and focal weakness.    Family History  Problem Relation Age of Onset  . Hypertension Mother   . Hypertension Sister     Past Medical History:  Diagnosis Date  . Colitis 07/2018  . GERD (gastroesophageal reflux disease)   . Uterine fibroid   . Vomiting (bilious) following gastrointestinal surgery 01/16/2020    Past Surgical History:  Procedure Laterality Date  . ABDOMINAL HYSTERECTOMY  2013  . BIOPSY  01/18/2020   Procedure: BIOPSY;  Surgeon: Madison [name] Madison [name];  Location: [facility];  Service: General;;  . ESOPHAGOGASTRODUODENOSCOPY N/A 12/04/2019   Procedure: UPPER GASTROINTESTINAL ENDOSCOPY;  Surgeon: Madison [name] Madison [name];   Location: [facility];  Service: General;  Laterality: N/A;  . ESOPHAGOGASTRODUODENOSCOPY N/A 03/24/2020   Procedure: ESOPHAGOGASTRODUODENOSCOPY (EGD);  Surgeon: Madison [name], Madison [name];  Location: [facility];  Service: General;  Laterality: N/A;  . ESOPHAGOGASTRODUODENOSCOPY (EGD) WITH PROPOFOL N/A 01/18/2020   Procedure: ESOPHAGOGASTRODUODENOSCOPY (EGD) WITH PROPOFOL;  Surgeon: Madison [name] Madison [name];  Location: [facility];  Service: General;  Laterality: N/A;  . GASTRIC ROUX-EN-Y N/A 12/04/2019   Procedure: LAPAROSCOPIC ROUX-EN-Y GASTRIC BYPASS;  Surgeon: Madison [name] Madison [name];  Location: [facility];  Service: General;  Laterality: N/A;    Social History:  reports that she quit smoking about 9 months ago. Her smoking use included cigarettes. She has a 22.00 pack-year smoking history. She has never used smokeless tobacco. She reports previous alcohol use. She reports that she does not use drugs.  Allergies:  Allergies  Allergen Reactions  . Oxycodone Nausea Only  . Adhesive [Tape] Rash    (Not in a hospital admission)    Physical Exam: Blood pressure (!) 141/104, pulse 89, temperature 98 F (36.7 C), temperature source Oral, last menstrual period 01/17/2012, SpO2 100 %. General: resting comfortably, appears stated age, no apparent distress Neurological: alert and oriented, no focal deficits, cranial nerves grossly in tact HEENT: normocephalic, atraumatic, oropharynx clear, no scleral icterus CV: regular rate and rhythm, extremities warm and well-perfused Respiratory: normal work of breathing on room air, symmetric chest wall expansion Abdomen: soft, nondistended, tender to palpation in RLQ. No masses or organomegaly. Well-healed port site surgical scars. Extremities: warm and well-perfused, no deformities, moving all extremities spontaneously Psychiatric: normal mood and affect  Skin: warm and dry, no jaundice, no rashes or lesions   Results for orders placed or performed during  the hospital encounter of 06/14/20 (from the past 48 hour(s))  Lipase, blood     Status: None   Collection Time: 06/14/20  5:24 AM  Result Value Ref Range   Lipase 24 11 - 51 U/L    Comment: Performed at [facility], [place] [address], [place], [place] [address]  Comprehensive metabolic panel     Status: Abnormal   Collection Time: 06/14/20  5:24 AM  Result Value Ref Range   Sodium 134 (L) 135 - 145 mmol/L   Potassium 4.0 3.5 - 5.1 mmol/L   Chloride 99 98 - 111 mmol/L   CO2 24 22 - 32 mmol/L   Glucose, Bld 112 (H) 70 - 99 mg/dL    Comment: Glucose reference range applies only to samples taken after fasting for at least 8 hours.   BUN 9 6 - 20 mg/dL   Creatinine, Ser 0.63 0.44 - 1.00 mg/dL   Calcium 9.0 8.9 - 10.3 mg/dL   Total Protein 7.9 6.5 - 8.1 g/dL   Albumin 3.4 (L) 3.5 - 5.0 g/dL   AST 16 15 - 41 U/L   ALT 17 0 - 44 U/L   Alkaline Phosphatase 85 38 - 126 U/L   Total Bilirubin 0.8 0.3 - 1.2 mg/dL   GFR, Estimated >60 >60 mL/min    Comment: (NOTE) Calculated using the CKD-EPI Creatinine Equation (2021)    Anion gap 11 5 - 15    Comment: Performed at [facility], [place] [address], [place], [place] [address]  CBC     Status: Abnormal   Collection Time: 06/14/20  5:24 AM  Result Value Ref Range   WBC 20.7 (H) 4.0 - 10.5 K/uL   RBC 4.77 3.87 - 5.11 MIL/uL   Hemoglobin 12.7 12.0 - 15.0 g/dL   HCT 38.4 36.0 - 46.0 %   MCV 80.5 80.0 - 100.0 fL   MCH 26.6 26.0 - 34.0 pg   MCHC 33.1 30.0 - 36.0 g/dL   RDW 14.0 11.5 - 15.5 %   Platelets 426 (H) 150 - 400 K/uL   nRBC 0.0 0.0 - 0.2 %    Comment: Performed at [facility], [place] [address] [place], [place] [address]  Urinalysis, Routine w reflex microscopic     Status: Abnormal   Collection Time: 06/14/20  5:24 AM  Result Value Ref Range   Color, Urine YELLOW YELLOW   APPearance CLEAR CLEAR   Specific Gravity, Urine 1.032 (H) 1.005 - 1.030   pH 5.0 5.0 - 8.0   Glucose, UA  NEGATIVE NEGATIVE mg/dL   Hgb urine dipstick NEGATIVE NEGATIVE   Bilirubin Urine NEGATIVE NEGATIVE   Ketones, ur 20 (A) NEGATIVE mg/dL   Protein, ur NEGATIVE NEGATIVE mg/dL   Nitrite NEGATIVE NEGATIVE   Leukocytes,Ua NEGATIVE NEGATIVE   RBC / HPF 0-5 0 - 5 RBC/hpf   WBC, UA 0-5 0 - 5 WBC/hpf   Bacteria, UA RARE (A) NONE SEEN   Squamous Epithelial / LPF 0-5 0 - 5   Mucus PRESENT     Comment: Performed at [facility], [place] [address], [place], [place] [address]  I-Stat beta hCG blood, ED     Status: None   Collection Time: 06/14/20  5:30 AM  Result Value Ref Range   I-stat hCG, quantitative <5.0 <5 mIU/mL   Comment 3  Comment:   GEST. AGE      CONC.  (mIU/mL)   <=1 WEEK        5 - 50     2 WEEKS       50 - 500     3 WEEKS       100 - 10,000     4 WEEKS     1,000 - 30,000        FEMALE AND NON-PREGNANT FEMALE:     LESS THAN 5 mIU/mL    CT ABDOMEN PELVIS W CONTRAST  Result Date: 06/14/2020 CLINICAL DATA:  Nausea, vomiting, abdominal pain nonlocalized. EXAM: CT ABDOMEN AND PELVIS WITH CONTRAST TECHNIQUE: Multidetector CT imaging of the abdomen and pelvis was performed using the standard protocol following bolus administration of intravenous contrast. CONTRAST:  171mL OMNIPAQUE IOHEXOL 300 MG/ML  SOLN COMPARISON:  CT abdomen pelvis 11/24/2018 FINDINGS: Lower chest: No acute abnormality. Hepatobiliary: No focal liver abnormality. No gallstones, gallbladder wall thickening, or pericholecystic fluid. No biliary dilatation. Pancreas: No focal lesion. Normal pancreatic contour. No surrounding inflammatory changes. No main pancreatic ductal dilatation. Spleen: Normal in size without focal abnormality. Adrenals/Urinary Tract: No adrenal nodule bilaterally. Bilateral kidneys enhance symmetrically. No hydronephrosis. No hydroureter. The urinary bladder is unremarkable. 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 Stomach is within normal  limits. No evidence of bowel wall thickening or dilatation. Appendix: The appendix is enlarged in caliber measuring up to 1.8 cm. Associated appendicoliths is noted within its lumen at its base. Associated periappendiceal fat stranding. The appendix is noted medial to the cecum entering the pelvis (2: 50-62). No definite appendiceal wall discontinuity. Vascular/Lymphatic: No abdominal aorta or iliac aneurysm. Trace atherosclerotic plaque of the aorta and its branches. No abdominal, pelvic, or inguinal lymphadenopathy. Reproductive: Status post hysterectomy. No adnexal masses. Other: Vague fat stranding along the anterior right/mid abdominal wall adjacent to the right hepatic lobe and transverse colon (5:40 2:35) unclear etiology. No intraperitoneal free fluid. No intraperitoneal free gas. No organized fluid collection. Musculoskeletal: No abdominal wall hernia or abnormality No suspicious lytic or blastic osseous lesions. No acute displaced fracture. L5-S1 degenerative changes. IMPRESSION: 1. Non-perforated acute appendicitis with associated appendicolith at its base. 2. Vague fat stranding along the anterior right/mid abdominal wall adjacent to the right hepatic lobe and transverse colon unclear etiology. Findings could be related to postsurgical changes in a patient with interval Roux-en-Y gastric bypass. Electronically Signed   By: Iven Finn M.D.   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 I reviewed her CT scan - the appendix is dilated and fluid-filled with an appendicolith. No signs of abscess or perforation. I discussed the details of laparoscopic appendectomy with the patient. - Proceed to OR this morning for lap appendectomy - COVID negative - Antibiotics - NPO for OR - Anticipate discharge home after surgery - I discussed with the patient that she needs to be on a daily PPI due  to her history of marginal ulcers, and I will make sure she has a script at discharge. She expressed understanding.  Michaelle Birks, [name] Surgery General, Hepatobiliary and Pancreatic Surgery 06/14/20 8:25 AM

## 2020-06-16 ENCOUNTER — Encounter (HOSPITAL_COMMUNITY): Payer: Self-pay | Admitting: Surgery

## 2020-06-16 NOTE — Anesthesia Postprocedure Evaluation (Signed)
Anesthesia Post Note  Patient: Madison [name]  Procedure(s) Performed: APPENDECTOMY LAPAROSCOPIC (N/A Abdomen)     Patient location during evaluation: PACU Anesthesia Type: General Level of consciousness: awake and alert Pain management: pain level controlled Vital Signs Assessment: post-procedure vital signs reviewed and stable Respiratory status: spontaneous breathing, nonlabored ventilation, respiratory function stable and patient connected to nasal cannula oxygen Cardiovascular status: blood pressure returned to baseline and stable Postop Assessment: no apparent nausea or vomiting Anesthetic complications: no   No complications documented.  Last Vitals:  Vitals:   06/14/20 1245 06/14/20 1300  BP: 119/79 109/76  Pulse: 99 88  Resp: 18 14  Temp:  37.3 C  SpO2: 95% 95%    Last Pain:  Vitals:   06/14/20 1300  TempSrc:   PainSc: [place]

## 2020-06-17 LAB — SURGICAL PATHOLOGY

## 2020-07-14 ENCOUNTER — Other Ambulatory Visit (HOSPITAL_COMMUNITY): Payer: Self-pay | Admitting: Surgery

## 2020-07-14 ENCOUNTER — Other Ambulatory Visit: Payer: Self-pay | Admitting: Surgery

## 2020-07-15 ENCOUNTER — Other Ambulatory Visit (HOSPITAL_COMMUNITY): Payer: Self-pay | Admitting: Surgery

## 2020-07-15 ENCOUNTER — Other Ambulatory Visit: Payer: Self-pay | Admitting: Surgery

## 2020-07-15 DIAGNOSIS — Z9049 Acquired absence of other specified parts of digestive tract: Secondary | ICD-10-CM

## 2020-07-22 ENCOUNTER — Ambulatory Visit (HOSPITAL_BASED_OUTPATIENT_CLINIC_OR_DEPARTMENT_OTHER): Admission: RE | Admit: 2020-07-22 | Payer: Self-pay | Source: Ambulatory Visit

## 2020-07-22 ENCOUNTER — Encounter (HOSPITAL_BASED_OUTPATIENT_CLINIC_OR_DEPARTMENT_OTHER): Payer: Self-pay

## 2020-07-24 ENCOUNTER — Emergency Department (HOSPITAL_COMMUNITY)
Admission: EM | Admit: 2020-07-24 | Discharge: 2020-07-24 | Disposition: A | Discharge status: Home / Self Care | Payer: Self-pay | Attending: Emergency Medicine | Admitting: Emergency Medicine

## 2020-07-24 ENCOUNTER — Emergency Department (HOSPITAL_COMMUNITY): Payer: Self-pay

## 2020-07-24 ENCOUNTER — Encounter (HOSPITAL_COMMUNITY): Payer: Self-pay

## 2020-07-24 DIAGNOSIS — N83291 Other ovarian cyst, right side: Secondary | ICD-10-CM | POA: Insufficient documentation

## 2020-07-24 DIAGNOSIS — R112 Nausea with vomiting, unspecified: Secondary | ICD-10-CM

## 2020-07-24 DIAGNOSIS — Z87891 Personal history of nicotine dependence: Secondary | ICD-10-CM | POA: Insufficient documentation

## 2020-07-24 DIAGNOSIS — K219 Gastro-esophageal reflux disease without esophagitis: Secondary | ICD-10-CM | POA: Insufficient documentation

## 2020-07-24 DIAGNOSIS — I10 Essential (primary) hypertension: Secondary | ICD-10-CM | POA: Insufficient documentation

## 2020-07-24 DIAGNOSIS — N83201 Unspecified ovarian cyst, right side: Secondary | ICD-10-CM

## 2020-07-24 DIAGNOSIS — R1010 Upper abdominal pain, unspecified: Secondary | ICD-10-CM | POA: Insufficient documentation

## 2020-07-24 LAB — CBC WITH DIFFERENTIAL/PLATELET
Abs Immature Granulocytes: 0.03 10*3/uL (ref 0.00–0.07)
Basophils Absolute: 0 10*3/uL (ref 0.0–0.1)
Basophils Relative: 0 %
Eosinophils Absolute: 0 10*3/uL (ref 0.0–0.5)
Eosinophils Relative: 0 %
HCT: 36.2 % (ref 36.0–46.0)
Hemoglobin: 11.4 g/dL — ABNORMAL LOW (ref 12.0–15.0)
Immature Granulocytes: 0 %
Lymphocytes Relative: 15 %
Lymphs Abs: 1.8 10*3/uL (ref 0.7–4.0)
MCH: 25.7 pg — ABNORMAL LOW (ref 26.0–34.0)
MCHC: 31.5 g/dL (ref 30.0–36.0)
MCV: 81.7 fL (ref 80.0–100.0)
Monocytes Absolute: 0.6 10*3/uL (ref 0.1–1.0)
Monocytes Relative: 5 %
Neutro Abs: 9.6 10*3/uL — ABNORMAL HIGH (ref 1.7–7.7)
Neutrophils Relative %: 80 %
Platelets: 407 10*3/uL — ABNORMAL HIGH (ref 150–400)
RBC: 4.43 MIL/uL (ref 3.87–5.11)
RDW: 14.7 % (ref 11.5–15.5)
WBC: 12.1 10*3/uL — ABNORMAL HIGH (ref 4.0–10.5)
nRBC: 0 % (ref 0.0–0.2)

## 2020-07-24 LAB — COMPREHENSIVE METABOLIC PANEL
ALT: 17 U/L (ref 0–44)
AST: 15 U/L (ref 15–41)
Albumin: 3.2 g/dL — ABNORMAL LOW (ref 3.5–5.0)
Alkaline Phosphatase: 75 U/L (ref 38–126)
Anion gap: 7 (ref 5–15)
BUN: 9 mg/dL (ref 6–20)
CO2: 27 mmol/L (ref 22–32)
Calcium: 8.6 mg/dL — ABNORMAL LOW (ref 8.9–10.3)
Chloride: 104 mmol/L (ref 98–111)
Creatinine, Ser: 0.67 mg/dL (ref 0.44–1.00)
GFR, Estimated: >60 mL/min (ref >60)
Glucose, Bld: 99 mg/dL (ref 70–99)
Potassium: 3.7 mmol/L (ref 3.5–5.1)
Sodium: 138 mmol/L (ref 135–145)
Total Bilirubin: 0.4 mg/dL (ref 0.3–1.2)
Total Protein: 7.3 g/dL (ref 6.5–8.1)

## 2020-07-24 LAB — LIPASE, BLOOD: Lipase: 25 U/L (ref 11–51)

## 2020-07-24 MED ORDER — DICYCLOMINE HCL 20 MG PO TABS: 20.0000 mg | ORAL_TABLET | Freq: Two times a day (BID) | ORAL | 0 refills | Status: AC | PRN

## 2020-07-24 MED ORDER — LIDOCAINE VISCOUS HCL 2 % MT SOLN
15.0000 mL | Freq: Once | OROMUCOSAL | Status: AC
  Administered 2020-07-24: 15 mL via ORAL
  Filled 2020-07-24: qty 15

## 2020-07-24 MED ORDER — ALUM & MAG HYDROXIDE-SIMETH 200-200-20 MG/5ML PO SUSP
30.0000 mL | Freq: Once | ORAL | Status: AC
  Administered 2020-07-24: 30 mL via ORAL
  Filled 2020-07-24: qty 30

## 2020-07-24 MED ORDER — ONDANSETRON 4 MG PO TBDP: 4.0000 mg | ORAL_TABLET | Freq: Three times a day (TID) | ORAL | 0 refills | Status: DC | PRN

## 2020-07-24 MED ORDER — FENTANYL CITRATE (PF) 100 MCG/2ML IJ SOLN
100.0000 ug | Freq: Once | INTRAMUSCULAR | Status: AC
  Administered 2020-07-24: 100 ug via INTRAVENOUS
  Filled 2020-07-24: qty 2

## 2020-07-24 MED ORDER — PANTOPRAZOLE SODIUM 40 MG PO TBEC: 40.0000 mg | DELAYED_RELEASE_TABLET | Freq: Every day | ORAL | 5 refills | Status: AC

## 2020-07-24 MED ORDER — IOHEXOL 300 MG/ML  SOLN
100.0000 mL | Freq: Once | INTRAMUSCULAR | Status: AC | PRN
  Administered 2020-07-24: 100 mL via INTRAVENOUS

## 2020-07-24 MED ORDER — MORPHINE SULFATE (PF) 4 MG/ML IV SOLN
4.0000 mg | Freq: Once | INTRAVENOUS | Status: AC
Start: 2020-07-24 — End: 2020-07-24
  Administered 2020-07-24: 4 mg via INTRAVENOUS
  Filled 2020-07-24: qty 1

## 2020-07-24 MED ORDER — DICYCLOMINE HCL 10 MG PO CAPS
10.0000 mg | ORAL_CAPSULE | Freq: Once | ORAL | Status: AC
  Administered 2020-07-24: 10 mg via ORAL
  Filled 2020-07-24: qty 1

## 2020-07-24 MED ORDER — ONDANSETRON HCL 4 MG/2ML IJ SOLN
4.0000 mg | Freq: Once | INTRAMUSCULAR | Status: AC
  Administered 2020-07-24: 4 mg via INTRAVENOUS
  Filled 2020-07-24: qty 2

## 2020-07-24 MED ORDER — DICYCLOMINE HCL 20 MG PO TABS: 20.0000 mg | ORAL_TABLET | Freq: Two times a day (BID) | ORAL | 0 refills | Status: DC | PRN

## 2020-07-24 MED ORDER — SODIUM CHLORIDE 0.9 % IV BOLUS
500.0000 mL | Freq: Once | INTRAVENOUS | Status: AC
  Administered 2020-07-24: 500 mL via INTRAVENOUS

## 2020-07-24 NOTE — ED Provider Notes (Signed)
[facility] DEPT Provider Note   CSN: [id number] Arrival date & time: 07/24/20  6295     History Chief Complaint  Patient presents with  . Abdominal Pain    Madison [name] is a 45 y.o. female presenting for evaluation of abdominal pain, nausea, vomiting.  Patient states she developed developed abdominal pain 2 days ago.  It has gradually worsened over the past 2 days.  She has had 2 episodes of emesis, now having dry heaves.  She has tolerated solid food in the past 2 days, however whenever she drinks water she has emesis.  She denies history of similar symptoms.  She denies fevers, chills, chest pain, shortness of breath, cough, urinary symptoms, abnormal bowel movements.  She had a gastric bypass in August 2021, and had an appendectomy 1 month ago.  She had no issues immediately following either surgery.  She takes Carafate for GERD, but does not take any other medications.  She has tried Tylenol for her symptoms without improvement, has not tried anything else.  She denies sick contacts.   HPI     Past Medical History:  Diagnosis Date  . Colitis 07/2018  . GERD (gastroesophageal reflux disease)   . Uterine fibroid   . Vomiting (bilious) following gastrointestinal surgery 01/16/2020    Patient Active Problem List   Diagnosis Date Noted  . Vomiting (bilious) following gastrointestinal surgery 01/16/2020  . Dehydration 12/10/2019  . Morbid obesity ([place]) 12/04/2019  . Obesity 07/13/2019  . Essential hypertension 11/09/2018  . Dysuria 11/09/2018  . Colitis 11/08/2018  . Morbid obesity with BMI of 40.0-44.9, adult ([place]) 09/12/2014  . Bilateral low back pain with left-sided sciatica 08/13/2014    Past Surgical History:  Procedure Laterality Date  . ABDOMINAL HYSTERECTOMY  2013  . BIOPSY  01/18/2020   Procedure: BIOPSY;  Surgeon: Kieth Brightly Arta Bruce, MD;  Location: [facility];  Service: General;;  . ESOPHAGOGASTRODUODENOSCOPY N/A 12/04/2019    Procedure: UPPER GASTROINTESTINAL ENDOSCOPY;  Surgeon: Kieth Brightly Arta Bruce, MD;  Location: [facility];  Service: General;  Laterality: N/A;  . ESOPHAGOGASTRODUODENOSCOPY N/A 03/24/2020   Procedure: ESOPHAGOGASTRODUODENOSCOPY (EGD);  Surgeon: Kieth Brightly, Arta Bruce, MD;  Location: [facility];  Service: General;  Laterality: N/A;  . ESOPHAGOGASTRODUODENOSCOPY (EGD) WITH PROPOFOL N/A 01/18/2020   Procedure: ESOPHAGOGASTRODUODENOSCOPY (EGD) WITH PROPOFOL;  Surgeon: Kieth Brightly Arta Bruce, MD;  Location: [facility];  Service: General;  Laterality: N/A;  . GASTRIC ROUX-EN-Y N/A 12/04/2019   Procedure: LAPAROSCOPIC ROUX-EN-Y GASTRIC BYPASS;  Surgeon: Kieth Brightly Arta Bruce, MD;  Location: [facility];  Service: General;  Laterality: N/A;  . LAPAROSCOPIC APPENDECTOMY N/A 06/14/2020   Procedure: APPENDECTOMY LAPAROSCOPIC;  Surgeon: Dwan Bolt, MD;  Location: [facility];  Service: General;  Laterality: N/A;     OB History   No obstetric history on file.     Family History  Problem Relation Age of Onset  . Hypertension Mother   . Hypertension Sister     Social History   Tobacco Use  . Smoking status: Former Smoker    Packs/day: 1.00    Years: 22.00    Pack years: 22.00    Types: Cigarettes    Quit date: 08/31/2019    Years since quitting: 0.8  . Smokeless tobacco: Never Used  Vaping Use  . Vaping Use: Never used  Substance Use Topics  . Alcohol use: Not Currently    Comment: none recent  . Drug use: No    Home Medications Prior to Admission medications  Medication Sig Start Date End Date Taking? Authorizing Provider  acetaminophen (TYLENOL) 500 MG tablet Take 1,000 mg by mouth every 6 (six) hours as needed for mild pain or headache.   Yes [provider]  HYDROcodone-acetaminophen (NORCO/VICODIN) 5-325 MG tablet Take 1 tablet by mouth every 6 (six) hours as needed for severe pain. 06/14/20  Yes Dwan Bolt, MD  Multiple Vitamins-Minerals (MULTIVITAMIN WITH MINERALS) tablet  Take 1 tablet by mouth daily.   Yes [provider]  ondansetron (ZOFRAN ODT) 4 MG disintegrating tablet Take 1 tablet (4 mg total) by mouth every 8 (eight) hours as needed for nausea or vomiting. 07/24/20  Yes Darleny Sem, PA-C  sucralfate (CARAFATE) 1 GM/10ML suspension Take 10 mLs (1 g total) by mouth 4 (four) times daily -  with meals and at bedtime. 01/18/20  Yes Kinsinger, Arta Bruce, MD  dicyclomine (BENTYL) 20 MG tablet Take 1 tablet (20 mg total) by mouth 2 (two) times daily as needed for spasms. 07/24/20   Dorthie Santini, PA-C  pantoprazole (PROTONIX) 40 MG tablet Take 1 tablet (40 mg total) by mouth daily. 07/24/20   Rashanda Magloire, PA-C  promethazine (PHENERGAN) 12.5 MG tablet Take 1 tablet (12.5 mg total) by mouth every 6 (six) hours as needed for nausea or vomiting. Patient not taking: No sig reported 01/23/20   Kinsinger, Arta Bruce, MD    Allergies    Adhesive [tape]  Review of Systems   Review of Systems  Gastrointestinal: Positive for abdominal pain, nausea and vomiting.  All other systems reviewed and are negative.   Physical Exam Updated Vital Signs BP (!) 139/95   Pulse 81   Temp 98.2 F (36.8 C)   Resp (!) 27   Ht 5\' 1"  (1.549 m)   Wt 74.4 kg   LMP 01/17/2012   SpO2 97%   BMI 30.99 kg/m   Physical Exam Vitals and nursing note reviewed.  Constitutional:      General: She is not in acute distress.    Appearance: She is well-developed.     Comments: Appears nontoxic  HENT:     Head: Normocephalic and atraumatic.  Eyes:     Extraocular Movements: Extraocular movements intact.     Conjunctiva/sclera: Conjunctivae normal.     Pupils: Pupils are equal, round, and reactive to light.  Cardiovascular:     Rate and Rhythm: Normal rate and regular rhythm.     Pulses: Normal pulses.  Pulmonary:     Effort: Pulmonary effort is normal. No respiratory distress.     Breath sounds: Normal breath sounds. No wheezing.  Abdominal:     General: There  is no distension.     Palpations: Abdomen is soft. There is no mass.     Tenderness: There is abdominal tenderness. There is no guarding or rebound.     Comments: Diffuse tenderness palpation of the abdomen, worse in the epigastric region.  No rigidity, guarding, distention.  Negative rebound.  No peritonitis.  Laparoscopic incisions noted to be well healed without erythema, focal tenderness, drainage  Musculoskeletal:        General: Normal range of motion.     Cervical back: Normal range of motion and neck supple.  Skin:    General: Skin is warm and dry.     Capillary Refill: Capillary refill takes less than 2 seconds.  Neurological:     Mental Status: She is alert and oriented to person, place, and time.     ED Results / Procedures /  Treatments   Labs (all labs ordered are listed, but only abnormal results are displayed) Labs Reviewed  CBC WITH DIFFERENTIAL/PLATELET - Abnormal; Notable for the following components:      Result Value   WBC 12.1 (*)    Hemoglobin 11.4 (*)    MCH 25.7 (*)    Platelets 407 (*)    Neutro Abs 9.6 (*)    All other components within normal limits  COMPREHENSIVE METABOLIC PANEL - Abnormal; Notable for the following components:   Calcium 8.6 (*)    Albumin 3.2 (*)    All other components within normal limits  LIPASE, BLOOD    EKG None  Radiology CT ABDOMEN PELVIS W CONTRAST  Result Date: 07/24/2020 CLINICAL DATA:  Abdominal pain and nausea and vomiting for 2 days. Approximately 1 month postop from appendectomy. EXAM: CT ABDOMEN AND PELVIS WITH CONTRAST TECHNIQUE: Multidetector CT imaging of the abdomen and pelvis was performed using the standard protocol following bolus administration of intravenous contrast. CONTRAST:  168mL OMNIPAQUE IOHEXOL 300 MG/ML  SOLN COMPARISON:  06/14/2020 FINDINGS: Lower Chest: No acute findings. Hepatobiliary: No hepatic masses identified. Gallbladder is unremarkable. No evidence of biliary ductal dilatation. Pancreas:   No mass or inflammatory changes. Spleen: Within normal limits in size and appearance. Adrenals/Urinary Tract: No masses identified. No evidence of ureteral calculi or hydronephrosis. Stomach/Bowel: Patient has undergone appendectomy since previous study. Prior sleeve gastrectomy is again noted. No evidence of bowel obstruction or wall thickening. Vascular/Lymphatic: No pathologically enlarged lymph nodes. No abdominal aortic aneurysm. Reproductive: Prior hysterectomy noted. A 2.7 cm simple appearing cyst is seen in the right adnexa which is new since previous study. There is mild soft tissue stranding seen in the adjacent adnexal fat, but no significant wall thickening or enhancement. This is favored to represent a functional ovarian cyst in a reproductive age female, with small postop fluid collection or abscess considered less likely. No evidence of free fluid. Other:  None. Musculoskeletal:  No suspicious bone lesions identified. IMPRESSION: 2.7 cm simple appearing cyst in right adnexa, with mild adjacent soft tissue stranding. This is favored to represent a functional ovarian cyst in a reproductive age female, with small postop fluid collection or abscess considered less likely. Recommend clinical correlation, and consider follow-up imaging with ultrasound or CT in 6 weeks. Electronically Signed   By: Marlaine Hind M.D.   On: 07/24/2020 10:35    Procedures Procedures   Medications Ordered in ED Medications  dicyclomine (BENTYL) capsule 10 mg (has no administration in time range)  alum & mag hydroxide-simeth (MAALOX/MYLANTA) 200-200-20 MG/5ML suspension 30 mL (has no administration in time range)    And  lidocaine (XYLOCAINE) 2 % viscous mouth solution 15 mL (has no administration in time range)  ondansetron (ZOFRAN) injection 4 mg (4 mg Intravenous Given 07/24/20 0838)  morphine 4 MG/ML injection 4 mg (4 mg Intravenous Given 07/24/20 0838)  sodium chloride 0.9 % bolus 500 mL (0 mLs Intravenous Stopped  07/24/20 1020)  iohexol (OMNIPAQUE) 300 MG/ML solution 100 mL (100 mLs Intravenous Contrast Given 07/24/20 0952)  fentaNYL (SUBLIMAZE) injection 100 mcg (100 mcg Intravenous Given 07/24/20 1020)    ED Course  I have reviewed the triage vital signs and the nursing notes.  Pertinent labs & imaging results that were available during my care of the patient were reviewed by me and considered in my medical decision making (see chart for details).    MDM Rules/Calculators/A&P  Patient presented for evaluation nausea, vomiting, abdominal pain.  On exam, patient appears nontoxic.  She does have diffuse tenderness palpation the abdomen, however is worse in the epigastric region.  Consider pancreatitis.  Consider GERD/gastritis/PUD.  Consider gall stones. Consider postop infection, though less likely without fever and without symptoms until today.  Will obtain labs and treat symptomatically.  Labs interpreted by me, overall reassuring. No leukocytosis. Kidney, liver, pancreatic fnx normal. On reassessment, nausea is resolved, but pt still has pain. Will given further medication, CT pending.   CT negative for acute findings that are consistent with patient's pain.  It does show a fluid collection adjacent to the ovary, thought to be a simple ovarian cyst.  However cannot specifically rule out postop infection.  However patient's pain is epigastric, and as patient is without fevers and I have very low suspicion for postop infection, I do not believe she needs further imaging of this area at this time.  However I did discuss findings with patient, importance of close follow-up with general surgery if pain persist.  On reassessment, patient reports symptoms continue to improve with medications.  Discussed continued treatment, and follow-up outpatient.  At this time, patient appears safe for discharge.  Return precautions given.  Patient states she understands and agrees to plan.  Final  Clinical Impression(s) / ED Diagnoses Final diagnoses:  Upper abdominal pain  Non-intractable vomiting with nausea, unspecified vomiting type  Right ovarian cyst    Rx / DC Orders ED Discharge Orders         Ordered    pantoprazole (PROTONIX) 40 MG tablet  Daily        07/24/20 1156    dicyclomine (BENTYL) 20 MG tablet  2 times daily PRN,   Status:  Discontinued        07/24/20 1156    dicyclomine (BENTYL) 20 MG tablet  2 times daily PRN        07/24/20 1158    ondansetron (ZOFRAN ODT) 4 MG disintegrating tablet  Every 8 hours PRN        07/24/20 1158           Sean Macwilliams, PA-C 07/24/20 1202    Daleen Bo, MD 07/24/20 [phone]

## 2020-07-24 NOTE — ED Triage Notes (Signed)
Pt c/o upper abd pain, n/v x 2 days. Had appendix out in February. States it's worse with movement

## 2020-07-24 NOTE — Discharge Instructions (Signed)
Continue taking home medications as prescribed, especially Carafate. Take Protonix daily. Use Bentyl as needed for abdominal pain or cramping. Use Zofran as needed for nausea or vomiting. Follow-up with Dr. Kieth Brightly as needed for further evaluation of your symptoms. Return to the emergency room if you develop high fevers, severe worsening pain, any new worsneing, or concerning symptoms.

## 2020-07-30 ENCOUNTER — Ambulatory Visit (HOSPITAL_COMMUNITY): Admission: RE | Admit: 2020-07-30 | Payer: Self-pay | Source: Ambulatory Visit

## 2020-07-30 ENCOUNTER — Ambulatory Visit (HOSPITAL_BASED_OUTPATIENT_CLINIC_OR_DEPARTMENT_OTHER): Payer: Medicaid Other

## 2020-08-12 ENCOUNTER — Encounter: Payer: Medicaid Other | Attending: General Surgery | Admitting: Skilled Nursing Facility1

## 2021-02-01 ENCOUNTER — Other Ambulatory Visit: Payer: Self-pay | Admitting: Nurse Practitioner

## 2021-07-02 ENCOUNTER — Encounter (HOSPITAL_COMMUNITY): Payer: Self-pay | Admitting: *Deleted

## 2021-08-06 IMAGING — MG DIGITAL SCREENING BILAT W/ TOMO W/ CAD
6 of 11 series · 6 of 31 positions shown · non-contrast
Comparison: None.

ACR Breast Density Category a: The breast tissue is almost entirely
fatty.

CLINICAL DATA: Screening.

EXAM:
DIGITAL SCREENING BILATERAL MAMMOGRAM WITH TOMO AND CAD

[R MLO synth-2D (1 of 2)]
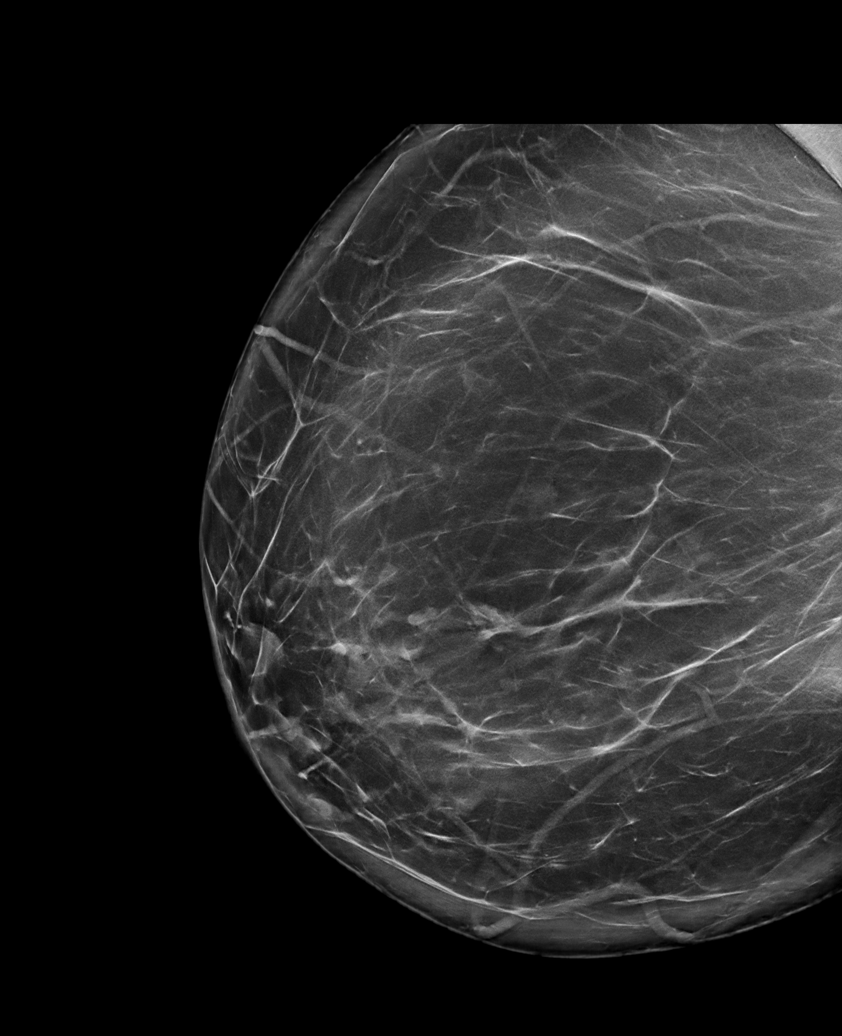

[L MLO synth-2D (1 of 2)]
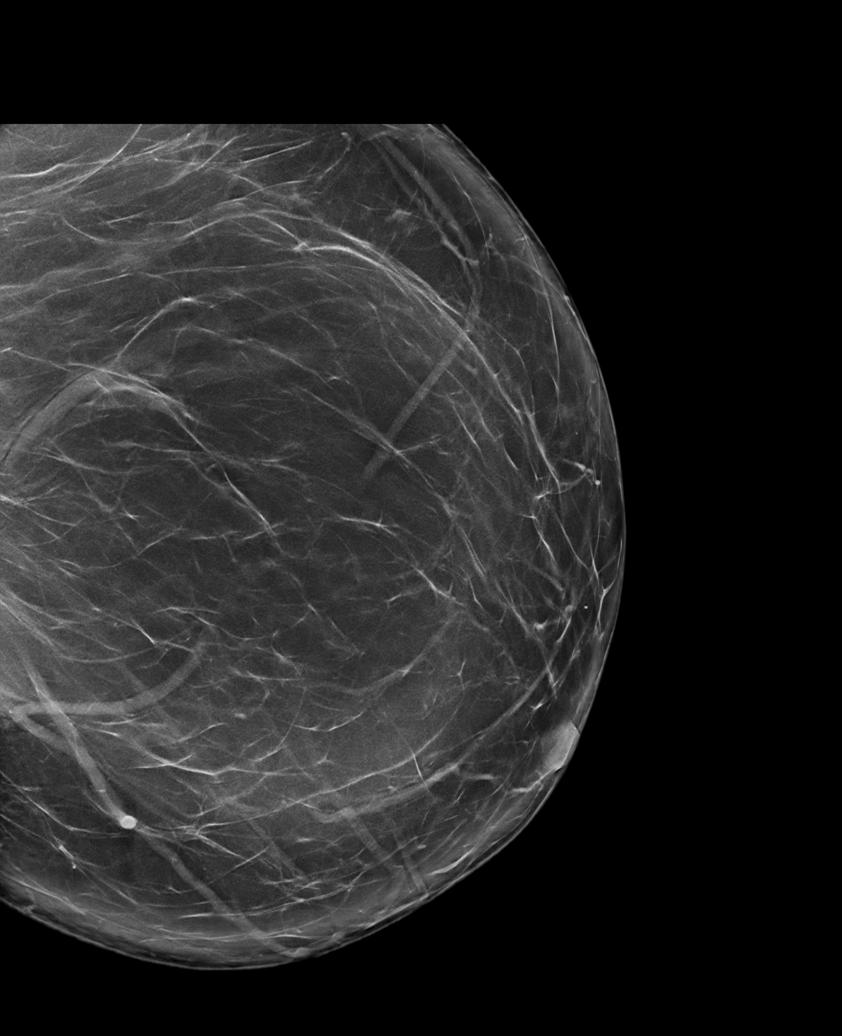

[R MLO synth-2D (2 of 2)]
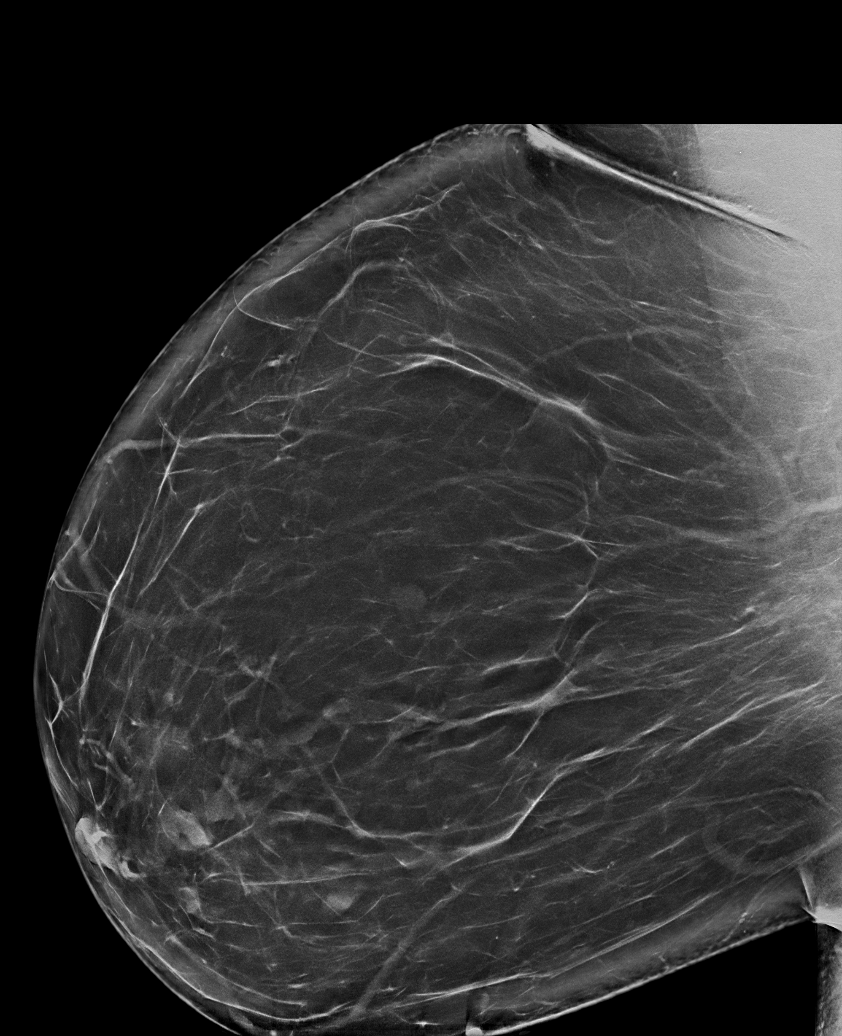

[R CC synth-2D]
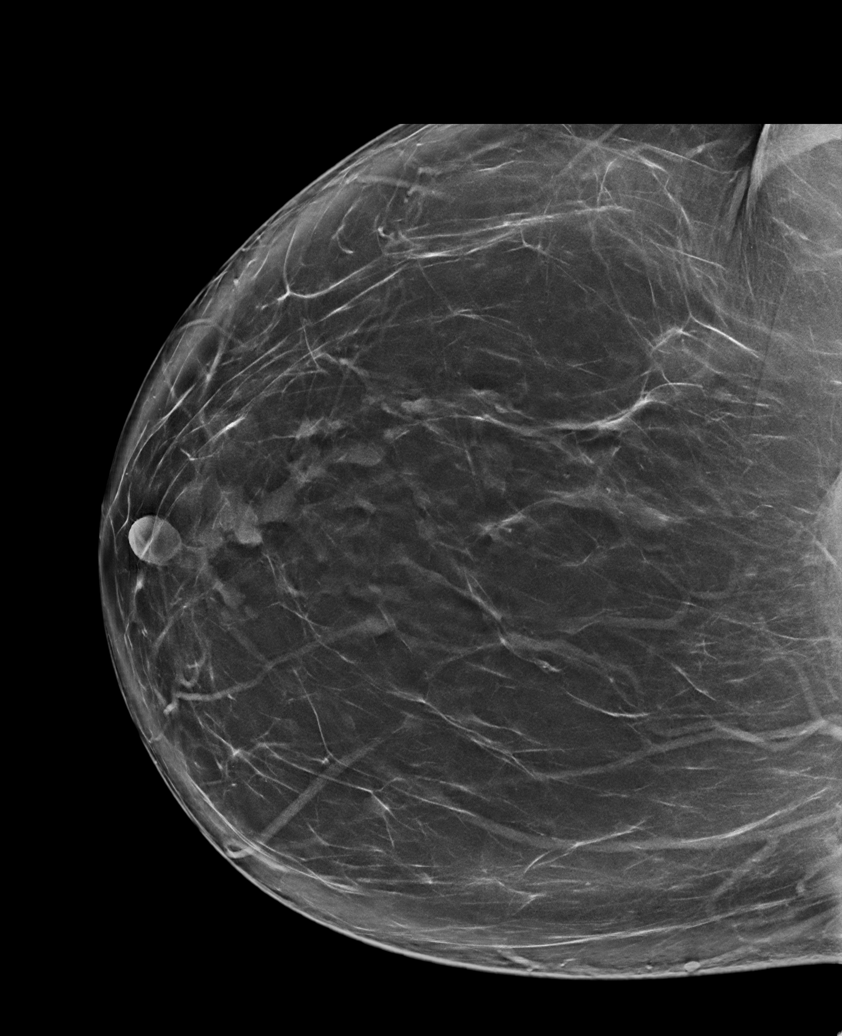

[L CC synth-2D]
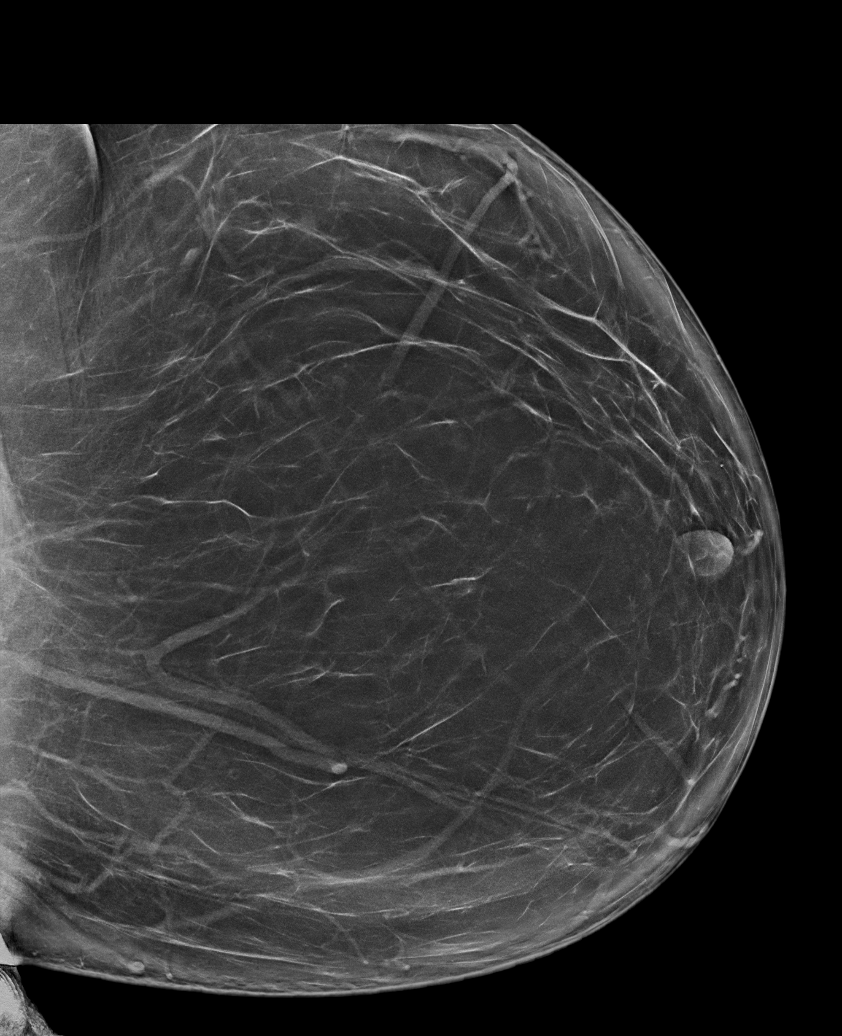

[L MLO synth-2D (2 of 2)]
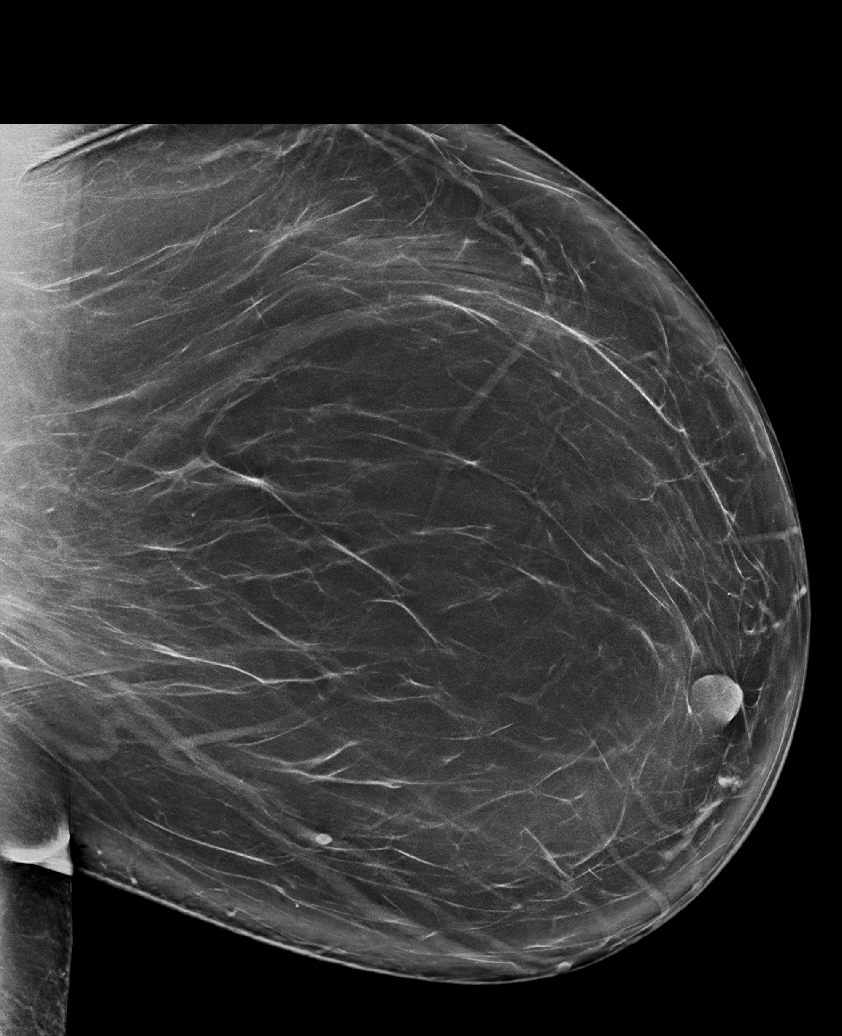

[6 of 31 positions shown; findings below may reference images not displayed]

FINDINGS: In the right breast, a possible asymmetry and mass warrants further
evaluation. In the left breast, no findings suspicious for
malignancy. Images were processed with CAD.
IMPRESSION: Further evaluation is suggested for possible asymmetry and mass in
the right breast.

RECOMMENDATION:
Diagnostic mammogram and possibly ultrasound of the right breast.
(Code:[id number])

The patient will be contacted regarding the findings, and additional
imaging will be scheduled.

BI-RADS CATEGORY  0: Incomplete. Need additional imaging evaluation
and/or prior mammograms for comparison.

## 2022-07-07 ENCOUNTER — Emergency Department (HOSPITAL_COMMUNITY): Payer: Self-pay

## 2022-07-07 ENCOUNTER — Other Ambulatory Visit: Payer: Self-pay

## 2022-07-07 ENCOUNTER — Emergency Department (HOSPITAL_COMMUNITY)
Admission: EM | Admit: 2022-07-07 | Discharge: 2022-07-07 | Disposition: A | Discharge status: Home / Self Care | Payer: Self-pay | Attending: Emergency Medicine | Admitting: Emergency Medicine

## 2022-07-07 ENCOUNTER — Encounter (HOSPITAL_COMMUNITY): Payer: Self-pay

## 2022-07-07 DIAGNOSIS — I10 Essential (primary) hypertension: Secondary | ICD-10-CM | POA: Insufficient documentation

## 2022-07-07 DIAGNOSIS — R112 Nausea with vomiting, unspecified: Secondary | ICD-10-CM | POA: Insufficient documentation

## 2022-07-07 DIAGNOSIS — R1011 Right upper quadrant pain: Secondary | ICD-10-CM | POA: Insufficient documentation

## 2022-07-07 DIAGNOSIS — R1013 Epigastric pain: Secondary | ICD-10-CM | POA: Insufficient documentation

## 2022-07-07 LAB — URINALYSIS, ROUTINE W REFLEX MICROSCOPIC
Bilirubin Urine: NEGATIVE
Glucose, UA: NEGATIVE mg/dL
Hgb urine dipstick: NEGATIVE
Ketones, ur: NEGATIVE mg/dL
Leukocytes,Ua: NEGATIVE
Nitrite: NEGATIVE
Protein, ur: NEGATIVE mg/dL
Specific Gravity, Urine: 1.02 (ref 1.005–1.030)
pH: 5 (ref 5.0–8.0)

## 2022-07-07 LAB — COMPREHENSIVE METABOLIC PANEL
ALT: 19 U/L (ref 0–44)
AST: 25 U/L (ref 15–41)
Albumin: 3.4 g/dL — ABNORMAL LOW (ref 3.5–5.0)
Alkaline Phosphatase: 57 U/L (ref 38–126)
Anion gap: 4 — ABNORMAL LOW (ref 5–15)
BUN: 15 mg/dL (ref 6–20)
CO2: 25 mmol/L (ref 22–32)
Calcium: 8.1 mg/dL — ABNORMAL LOW (ref 8.9–10.3)
Chloride: 105 mmol/L (ref 98–111)
Creatinine, Ser: 0.77 mg/dL (ref 0.44–1.00)
GFR, Estimated: >60 mL/min (ref >60)
Glucose, Bld: 87 mg/dL (ref 70–99)
Potassium: 4.1 mmol/L (ref 3.5–5.1)
Sodium: 134 mmol/L — ABNORMAL LOW (ref 135–145)
Total Bilirubin: 0.4 mg/dL (ref 0.3–1.2)
Total Protein: 7.4 g/dL (ref 6.5–8.1)

## 2022-07-07 LAB — CBC WITH DIFFERENTIAL/PLATELET
Abs Immature Granulocytes: 0.01 10*3/uL (ref 0.00–0.07)
Basophils Absolute: 0 10*3/uL (ref 0.0–0.1)
Basophils Relative: 0 %
Eosinophils Absolute: 0.1 10*3/uL (ref 0.0–0.5)
Eosinophils Relative: 1 %
HCT: 34.5 % — ABNORMAL LOW (ref 36.0–46.0)
Hemoglobin: 11.1 g/dL — ABNORMAL LOW (ref 12.0–15.0)
Immature Granulocytes: 0 %
Lymphocytes Relative: 29 %
Lymphs Abs: 2.7 10*3/uL (ref 0.7–4.0)
MCH: 26.5 pg (ref 26.0–34.0)
MCHC: 32.2 g/dL (ref 30.0–36.0)
MCV: 82.3 fL (ref 80.0–100.0)
Monocytes Absolute: 0.6 10*3/uL (ref 0.1–1.0)
Monocytes Relative: 7 %
Neutro Abs: 5.9 10*3/uL (ref 1.7–7.7)
Neutrophils Relative %: 63 %
Platelets: 342 10*3/uL (ref 150–400)
RBC: 4.19 MIL/uL (ref 3.87–5.11)
RDW: 13.2 % (ref 11.5–15.5)
WBC: 9.3 10*3/uL (ref 4.0–10.5)
nRBC: 0 % (ref 0.0–0.2)

## 2022-07-07 LAB — TROPONIN I (HIGH SENSITIVITY)
Troponin I (High Sensitivity): 3 ng/L (ref <18)
Troponin I (High Sensitivity): 3 ng/L (ref <18)

## 2022-07-07 LAB — LIPASE, BLOOD: Lipase: 36 U/L (ref 11–51)

## 2022-07-07 MED ORDER — IOHEXOL 300 MG/ML  SOLN
100.0000 mL | Freq: Once | INTRAMUSCULAR | Status: AC | PRN
  Administered 2022-07-07: 100 mL via INTRAVENOUS

## 2022-07-07 MED ORDER — ONDANSETRON 4 MG PO TBDP: 4.0000 mg | ORAL_TABLET | Freq: Three times a day (TID) | ORAL | 0 refills | Status: DC | PRN

## 2022-07-07 MED ORDER — ONDANSETRON 4 MG PO TBDP: 4.0000 mg | ORAL_TABLET | Freq: Three times a day (TID) | ORAL | 0 refills | Status: AC | PRN

## 2022-07-07 MED ORDER — SUCRALFATE 1 GM/10ML PO SUSP: 1.0000 g | Freq: Three times a day (TID) | ORAL | 0 refills | Status: AC

## 2022-07-07 MED ORDER — HYDROMORPHONE HCL 1 MG/ML IJ SOLN
0.5000 mg | Freq: Once | INTRAMUSCULAR | Status: AC
  Administered 2022-07-07: 0.5 mg via INTRAVENOUS
  Filled 2022-07-07: qty 1

## 2022-07-07 MED ORDER — ALUM & MAG HYDROXIDE-SIMETH 200-200-20 MG/5ML PO SUSP
30.0000 mL | Freq: Once | ORAL | Status: AC
  Administered 2022-07-07: 30 mL via ORAL
  Filled 2022-07-07: qty 30

## 2022-07-07 MED ORDER — MORPHINE SULFATE (PF) 4 MG/ML IV SOLN
4.0000 mg | Freq: Once | INTRAVENOUS | Status: AC
  Administered 2022-07-07: 4 mg via INTRAVENOUS
  Filled 2022-07-07: qty 1

## 2022-07-07 MED ORDER — SUCRALFATE 1 G PO TABS
1.0000 g | ORAL_TABLET | Freq: Once | ORAL | Status: AC
  Administered 2022-07-07: 1 g via ORAL
  Filled 2022-07-07: qty 1

## 2022-07-07 MED ORDER — SUCRALFATE 1 GM/10ML PO SUSP: 1.0000 g | Freq: Three times a day (TID) | ORAL | 0 refills | Status: DC

## 2022-07-07 MED ORDER — METOCLOPRAMIDE HCL 5 MG/ML IJ SOLN
10.0000 mg | Freq: Once | INTRAMUSCULAR | Status: AC
  Administered 2022-07-07: 10 mg via INTRAVENOUS
  Filled 2022-07-07: qty 2

## 2022-07-07 MED ORDER — LACTATED RINGERS IV BOLUS
1000.0000 mL | Freq: Once | INTRAVENOUS | Status: AC
  Administered 2022-07-07: 1000 mL via INTRAVENOUS

## 2022-07-07 MED ORDER — LIDOCAINE VISCOUS HCL 2 % MT SOLN
15.0000 mL | Freq: Once | OROMUCOSAL | Status: AC
  Administered 2022-07-07: 15 mL via ORAL
  Filled 2022-07-07: qty 15

## 2022-07-07 MED ORDER — ONDANSETRON HCL 4 MG/2ML IJ SOLN
4.0000 mg | Freq: Once | INTRAMUSCULAR | Status: AC
  Administered 2022-07-07: 4 mg via INTRAVENOUS
  Filled 2022-07-07: qty 2

## 2022-07-07 NOTE — ED Notes (Signed)
Pt called out requesting additional pain medication. Morphine given per MAR at 1657. Will notify MD.

## 2022-07-07 NOTE — ED Notes (Signed)
Pt PO challenging with water and saltine crackers.

## 2022-07-07 NOTE — ED Provider Triage Note (Signed)
Emergency Medicine Provider Triage Evaluation Note  Madison [name] , a 47 y.o. female  was evaluated in triage.  Pt complains of upper abdominal pain and RUQ pain for the past 4-5 days, but worsened over the past few days. Reports nausea and vomiting. No bowel changes. No fever.  Review of Systems  Positive:  Negative:   Physical Exam  BP 136/88 (BP Location: Left Arm)   Pulse 82   Temp 98.2 F (36.8 C) (Oral)   Resp 16   LMP 01/17/2012   SpO2 100%  Gen:   Awake, no distress   Resp:  Normal effort  MSK:   Moves extremities without difficulty  Other:  Epigastric and RUQ tenderenss to palpation. Soft.   Medical Decision Making  Medically screening exam initiated at 3:06 PM.  Appropriate orders placed.  Madison [name] was informed that the remainder of the evaluation will be completed by another provider, this initial triage assessment does not replace that evaluation, and the importance of remaining in the ED until their evaluation is complete.  [place] and labs ordered   Madison [name], [place] 07/07/22 1508

## 2022-07-07 NOTE — ED Notes (Signed)
Patient was able to tolerate saltines and water with no n/v

## 2022-07-07 NOTE — ED Triage Notes (Signed)
C/o upper abd pain x4 days with n/v Pain with palpitation. Pain improved with bending over Denies radiation. Denies ETOH usage.  Hx GERD.

## 2022-07-07 NOTE — Discharge Instructions (Addendum)
You were seen in the ER today for evaluation of your abdominal pain.  Your labs and imaging were grossly unremarkable.  Not sure was causing abdominal pain.  I do recommend following up with your bariatric surgeon.  Get prescribed some Carafate for you to resume taking.  I am also sending you home with some nausea medication that you take as needed.  If you have any concerns, new or worsening symptoms, please return to the nearest emergency department for evaluation.  Contact a doctor if: Your belly pain changes or gets worse. You are not hungry, or you lose weight without trying. You are having trouble pooping (constipated) or have watery poop (diarrhea) for more than 2-3 days. You have pain when you pee or poop. Your belly pain wakes you up at night. Your pain gets worse with meals, after eating, or with certain foods. You are vomiting and cannot keep anything down. You have a fever. You have blood in your pee. Get help right away if: Your pain does not go away as soon as your doctor says it should. You cannot stop vomiting. Your pain is only in areas of your belly, such as the right side or the left lower part of the belly. You have bloody or black poop, or poop that looks like tar. You have very bad pain, cramping, or bloating in your belly. You have signs of not having enough fluid or water in your body (dehydration), such as: Dark pee, very little pee, or no pee. Cracked lips. Dry mouth. Sunken eyes. Sleepiness. Weakness. You have trouble breathing or chest pain.

## 2022-07-07 NOTE — ED Provider Notes (Signed)
[facility] Provider Note   CSN: [id number] Arrival date & time: 07/07/22  1457     History Chief Complaint  Patient presents with   Abdominal Pain    Madison [name] is a 47 y.o. female with history of colitis, hypertension, gastric Roux-en-Y, appendectomy presents emerged from today for evaluation of worsening right upper quadrant and epigastric pain for the past 4 to 5 days.  Patient reports has been worsening mainly over the past 2 days.  She started having nausea with nonbloody nonbilious emesis today.  She reports that she also had an episode of loose stool as well.  No black or blood in the stool.  No fevers.  No chest pain, shortness of breath, dysuria, hematuria, vaginal discharge, vaginal bleeding, fevers, or recent cough or cold symptoms.  Patient reports that she has had this problem previous only which is when she had her appendix removed and then once after.  She has not taken any medication for pain or nausea prior to arrival. She denies any radiation of the pain. She denies any exacerbating or relieving factors of the pain. Describes it as sharp and stabbing. She denies any tobacco, EtOH, or illicit drug use.    Abdominal Pain Associated symptoms: nausea and vomiting   Associated symptoms: no chest pain, no chills, no constipation, no cough, no diarrhea, no dysuria, no fever, no hematuria, no shortness of breath, no vaginal bleeding and no vaginal discharge        Home Medications Prior to Admission medications   Medication Sig Start Date End Date Taking? Authorizing Provider  acetaminophen (TYLENOL) 500 MG tablet Take 1,000 mg by mouth every 6 (six) hours as needed for mild pain or headache.    [provider]  dicyclomine (BENTYL) 20 MG tablet Take 1 tablet (20 mg total) by mouth 2 (two) times daily as needed for spasms. 07/24/20   Caccavale, Sophia, PA-C  HYDROcodone-acetaminophen (NORCO/VICODIN) 5-325 MG tablet  Take 1 tablet by mouth every 6 (six) hours as needed for severe pain. 06/14/20   Dwan Bolt, MD  Multiple Vitamins-Minerals (MULTIVITAMIN WITH MINERALS) tablet Take 1 tablet by mouth daily.    [provider]  ondansetron (ZOFRAN ODT) 4 MG disintegrating tablet Take 1 tablet (4 mg total) by mouth every 8 (eight) hours as needed for nausea or vomiting. 07/24/20   Caccavale, Sophia, PA-C  pantoprazole (PROTONIX) 40 MG tablet Take 1 tablet (40 mg total) by mouth daily. 07/24/20   Caccavale, Sophia, PA-C  promethazine (PHENERGAN) 12.5 MG tablet Take 1 tablet (12.5 mg total) by mouth every 6 (six) hours as needed for nausea or vomiting. Patient not taking: No sig reported 01/23/20   Kinsinger, Arta Bruce, MD  sucralfate (CARAFATE) 1 GM/10ML suspension Take 10 mLs (1 g total) by mouth 4 (four) times daily -  with meals and at bedtime. 01/18/20   Kinsinger, Arta Bruce, MD      Allergies    Oxycodone and Adhesive [tape]    Review of Systems   Review of Systems  Constitutional:  Negative for chills and fever.  HENT:  Negative for congestion and rhinorrhea.   Respiratory:  Negative for cough and shortness of breath.   Cardiovascular:  Negative for chest pain.  Gastrointestinal:  Positive for abdominal pain, nausea and vomiting. Negative for blood in stool, constipation and diarrhea.  Genitourinary:  Negative for dysuria, hematuria, vaginal bleeding and vaginal discharge.    Physical Exam Updated Vital Signs  BP (!) 143/108   Pulse 78   Temp 98.2 F (36.8 C) (Oral)   Resp 19   Wt 74 kg   LMP 01/17/2012   SpO2 100%   BMI 30.83 kg/m  Physical Exam Vitals and nursing note reviewed.  Constitutional:      General: She is not in acute distress.    Appearance: Normal appearance. She is not ill-appearing or toxic-appearing.  HENT:     Head: Normocephalic and atraumatic.     Mouth/Throat:     Mouth: Mucous membranes are moist.  Eyes:     General: No scleral icterus. Cardiovascular:      Rate and Rhythm: Normal rate and regular rhythm.  Pulmonary:     Effort: Pulmonary effort is normal.     Breath sounds: Normal breath sounds.  Abdominal:     General: Bowel sounds are normal. There is no distension.     Palpations: Abdomen is soft.     Tenderness: There is abdominal tenderness in the right upper quadrant and epigastric area. There is no right CVA tenderness, left CVA tenderness, guarding or rebound.  Musculoskeletal:        General: No deformity.     Cervical back: Normal range of motion.  Skin:    General: Skin is warm and dry.  Neurological:     General: No focal deficit present.     Mental Status: She is alert. Mental status is at baseline.     ED Results / Procedures / Treatments   Labs (all labs ordered are listed, but only abnormal results are displayed) Labs Reviewed  CBC WITH DIFFERENTIAL/PLATELET - Abnormal; Notable for the following components:      Result Value   Hemoglobin 11.1 (*)    HCT 34.5 (*)    All other components within normal limits  COMPREHENSIVE METABOLIC PANEL - Abnormal; Notable for the following components:   Sodium 134 (*)    Calcium 8.1 (*)    Albumin 3.4 (*)    Anion gap 4 (*)    All other components within normal limits  LIPASE, BLOOD  URINALYSIS, ROUTINE W REFLEX MICROSCOPIC  TROPONIN I (HIGH SENSITIVITY)  TROPONIN I (HIGH SENSITIVITY)    EKG None  Radiology CT ABDOMEN PELVIS W CONTRAST  Result Date: 07/07/2022 CLINICAL DATA:  Epigastric pain, nausea, vomiting EXAM: CT ABDOMEN AND PELVIS WITH CONTRAST TECHNIQUE: Multidetector CT imaging of the abdomen and pelvis was performed using the standard protocol following bolus administration of intravenous contrast. RADIATION DOSE REDUCTION: This exam was performed according to the departmental dose-optimization program which includes automated exposure control, adjustment of the mA and/or kV according to patient size and/or use of iterative reconstruction technique. CONTRAST:   133m OMNIPAQUE IOHEXOL 300 MG/ML  SOLN COMPARISON:  07/24/2020 FINDINGS: Lower chest: Breathing motion limits evaluation of lower lung fields. As far as seen, there is no focal pulmonary consolidation. Hepatobiliary: Liver measures 21.2 cm in length. No focal abnormalities are seen. There is no dilation of bile ducts. There is no wall thickening in gallbladder. Pancreas: No focal abnormalities are seen. Spleen: Unremarkable. Adrenals/Urinary Tract: Adrenals are unremarkable. There is no hydronephrosis. There are no renal or ureteral stones. Urinary bladder is unremarkable. Stomach/Bowel: Surgical staples are seen in stomach. There is no fluid around the stomach. Surgical staples are seen in the jejunum. There is no significant small bowel dilation. Appendix is not seen. There is no pericecal inflammation. There is no significant wall thickening in colon. Vascular/Lymphatic: Scattered arterial calcifications are seen.  No significant lymphadenopathy seen. Reproductive: Uterus is not seen.  There are no adnexal masses. Other: There is no ascites or pneumoperitoneum. Small umbilical hernia containing fat is seen. Musculoskeletal: Degenerative changes are noted in lower lumbar spine, most severe at L5-S1 level. There is mild retrolisthesis at L5-S1 level. Spinal stenosis and encroachment of neural foramina is seen at L4-L5 and L5-S1 levels. IMPRESSION: There is no evidence of intestinal obstruction or pneumoperitoneum. There is no hydronephrosis. Previous gastric bypass.  Enlarged liver.  Lumbar spondylosis. Other findings as described in the body of the report. Electronically Signed   By: Elmer Picker M.D.   On: 07/07/2022 18:52   US Abdomen Limited RUQ (LIVER/GB)  Result Date: 07/07/2022 CLINICAL DATA:  Epigastric pain EXAM: ULTRASOUND ABDOMEN LIMITED RIGHT UPPER QUADRANT COMPARISON:  CT 07/24/2020 FINDINGS: Gallbladder: No gallstones or wall thickening visualized. No sonographic Murphy sign noted by  sonographer. Common bile duct: Diameter: 2 mm Liver: No focal lesion identified. Within normal limits in parenchymal echogenicity. Portal vein is patent on color Doppler imaging with normal direction of blood flow towards the liver. Other: None. IMPRESSION: No gallstones or ductal dilatation. Electronically Signed   By: Jill Side M.D.   On: 07/07/2022 15:55    Procedures Procedures   Medications Ordered in ED Medications  morphine (PF) 4 MG/ML injection 4 mg (4 mg Intravenous Given 07/07/22 1657)  lactated ringers bolus 1,000 mL (1,000 mLs Intravenous New Bag/Given 07/07/22 1701)  ondansetron (ZOFRAN) injection 4 mg (4 mg Intravenous Given 07/07/22 1657)  morphine (PF) 4 MG/ML injection 4 mg (4 mg Intravenous Given 07/07/22 1803)  alum & mag hydroxide-simeth (MAALOX/MYLANTA) 200-200-20 MG/5ML suspension 30 mL (30 mLs Oral Given 07/07/22 1802)    And  lidocaine (XYLOCAINE) 2 % viscous mouth solution 15 mL (15 mLs Oral Given 07/07/22 1803)  iohexol (OMNIPAQUE) 300 MG/ML solution 100 mL (100 mLs Intravenous Contrast Given 07/07/22 1824)  HYDROmorphone (DILAUDID) injection 0.5 mg (0.5 mg Intravenous Given 07/07/22 1915)    ED Course/ Medical Decision Making/ A&P   1}                          Medical Decision Making Amount and/or Complexity of Data Reviewed Labs: ordered. Radiology: ordered.  Risk OTC drugs. Prescription drug management.  47 y.o. female presents to the ER for evaluation of epigastric/RUQ abdominal pain with N/V. Differential diagnosis includes but is not limited to AAA, mesenteric ischemia, appendicitis, diverticulitis, DKA, gastroenteritis, nephrolithiasis, pancreatitis, constipation, UTI, bowel obstruction, biliary disease, IBD, PUD, hepatitis. Vital signs show mildly elevated BP, otherwise normal. Physical exam as noted above.   Labs and imaging ordered. Will start off with RUQ [place].  I independently reviewed and interpreted the patient's labs. CMP shows mildly low sodium at 134,  mildly decreased calcium and albumin. No other electrolyte or LFT abnormalities. CBC shows mild anemia at her baseline. No leukocytosis. Lipase within normal limits. Troponins flat at 3. Urinalysis unremarkable.   US imaging shows no gallstones or ductal dilatation. Will order CT imaging.   CT shows there is no evidence of intestinal obstruction or pneumoperitoneum. There is no hydronephrosis. Previous gastric bypass.  Enlarged liver.  Lumbar spondylosis. Other findings as described in the body of the report.  The patient has had morphine and dilaudid as well as IV fluids. Zofran ordered for her nausea as well as a GI cocktail.  On previous chart evaluation, it appears that the patient was seen on 07/24/20 with similar  pain. She had an unremarkable workup there as well. She no longer takes carafate.   After consideration of the diagnostic results and the patients response to treatment, I feel that the patient is stable for discharge home. Her workup is grossly unremarkable.  Her troponins are flat, doubt any ACS at this time.  Doubt any dissection or aneurysm with imaging findings as.  As well as story and offering typical clinical picture.  She does not have any black or bloody stool and her anemia is at her baseline.  Doubt any IBD or PUD.  Urine is unremarkable, less likely UTI.  CT does not show any signs of any bowel obstruction.  Could be viral gastroenteritis or nausea vomiting and a postop gastric bypass patient.  She was given IV fluids and is urinating multiple times.  She does not show any clinical signs of dehydration as well.  Will encourage her to follow-up with her general surgeon Dr. Cherlyn Roberts for reevaluation after surgery.  Will start her back on carafate and send in Zofran. We discussed return precautions about symptoms.  Patient verbalizes understanding agrees the plan.  Patient is stable being discharged home in condition.  Portions of this report may have been transcribed using voice  recognition software. Every effort was made to ensure accuracy; however, inadvertent computerized transcription errors may be present.   Final Clinical Impression(s) / ED Diagnoses Final diagnoses:  Epigastric pain  Nausea and vomiting, unspecified vomiting type    Rx / DC Orders ED Discharge Orders          Ordered    ondansetron (ZOFRAN ODT) 4 MG disintegrating tablet  Every 8 hours PRN,   Status:  Discontinued        07/07/22 2137    sucralfate (CARAFATE) 1 GM/10ML suspension  3 times daily with meals & bedtime,   Status:  Discontinued        07/07/22 2137    ondansetron (ZOFRAN ODT) 4 MG disintegrating tablet  Every 8 hours PRN        07/07/22 2137    sucralfate (CARAFATE) 1 GM/10ML suspension  3 times daily with meals & bedtime        07/07/22 2137              Sherrell Puller, PA-C 07/11/22 1845    Drenda Freeze, MD 07/12/22 1455

## 2022-07-07 NOTE — ED Notes (Signed)
Pt up using restroom, ambulatory with steady gait.

## 2022-07-09 ENCOUNTER — Encounter (HOSPITAL_COMMUNITY): Payer: Self-pay | Admitting: *Deleted

## 2023-07-07 ENCOUNTER — Encounter (HOSPITAL_COMMUNITY): Payer: Self-pay | Admitting: *Deleted

## 2023-11-19 ENCOUNTER — Emergency Department (HOSPITAL_BASED_OUTPATIENT_CLINIC_OR_DEPARTMENT_OTHER): Payer: Self-pay | Admitting: Radiology

## 2023-11-19 ENCOUNTER — Encounter (HOSPITAL_BASED_OUTPATIENT_CLINIC_OR_DEPARTMENT_OTHER): Payer: Self-pay

## 2023-11-19 ENCOUNTER — Emergency Department (HOSPITAL_BASED_OUTPATIENT_CLINIC_OR_DEPARTMENT_OTHER): Payer: Self-pay

## 2023-11-19 ENCOUNTER — Emergency Department (HOSPITAL_BASED_OUTPATIENT_CLINIC_OR_DEPARTMENT_OTHER)
Admission: EM | Admit: 2023-11-19 | Discharge: 2023-11-19 | Disposition: A | Discharge status: Home / Self Care | Payer: Self-pay | Attending: Emergency Medicine | Admitting: Emergency Medicine

## 2023-11-19 ENCOUNTER — Other Ambulatory Visit: Payer: Self-pay

## 2023-11-19 DIAGNOSIS — R509 Fever, unspecified: Secondary | ICD-10-CM

## 2023-11-19 DIAGNOSIS — J029 Acute pharyngitis, unspecified: Secondary | ICD-10-CM | POA: Insufficient documentation

## 2023-11-19 DIAGNOSIS — R52 Pain, unspecified: Secondary | ICD-10-CM

## 2023-11-19 DIAGNOSIS — R42 Dizziness and giddiness: Secondary | ICD-10-CM | POA: Insufficient documentation

## 2023-11-19 LAB — URINALYSIS, ROUTINE W REFLEX MICROSCOPIC
Bacteria, UA: NONE SEEN
Bilirubin Urine: NEGATIVE
Glucose, UA: NEGATIVE mg/dL
Hgb urine dipstick: NEGATIVE
Leukocytes,Ua: NEGATIVE
Nitrite: NEGATIVE
Protein, ur: 30 mg/dL — AB
Specific Gravity, Urine: 1.03 (ref 1.005–1.030)
pH: 6.5 (ref 5.0–8.0)

## 2023-11-19 LAB — COMPREHENSIVE METABOLIC PANEL WITH GFR
ALT: 18 U/L (ref 0–44)
AST: 28 U/L (ref 15–41)
Albumin: 3.9 g/dL (ref 3.5–5.0)
Alkaline Phosphatase: 70 U/L (ref 38–126)
Anion gap: 14 (ref 5–15)
BUN: 11 mg/dL (ref 6–20)
CO2: 21 mmol/L — ABNORMAL LOW (ref 22–32)
Calcium: 9.2 mg/dL (ref 8.9–10.3)
Chloride: 97 mmol/L — ABNORMAL LOW (ref 98–111)
Creatinine, Ser: 1.03 mg/dL — ABNORMAL HIGH (ref 0.44–1.00)
GFR, Estimated: >60 mL/min (ref >60)
Glucose, Bld: 116 mg/dL — ABNORMAL HIGH (ref 70–99)
Potassium: 4 mmol/L (ref 3.5–5.1)
Sodium: 132 mmol/L — ABNORMAL LOW (ref 135–145)
Total Bilirubin: 0.3 mg/dL (ref 0.0–1.2)
Total Protein: 9.1 g/dL — ABNORMAL HIGH (ref 6.5–8.1)

## 2023-11-19 LAB — RESP PANEL BY RT-PCR (RSV, FLU A&B, COVID)  RVPGX2
Influenza A by PCR: NEGATIVE
Influenza B by PCR: NEGATIVE
Resp Syncytial Virus by PCR: NEGATIVE
SARS Coronavirus 2 by RT PCR: NEGATIVE

## 2023-11-19 LAB — CBC
HCT: 39.3 % (ref 36.0–46.0)
Hemoglobin: 12.7 g/dL (ref 12.0–15.0)
MCH: 26.6 pg (ref 26.0–34.0)
MCHC: 32.3 g/dL (ref 30.0–36.0)
MCV: 82.2 fL (ref 80.0–100.0)
Platelets: 339 K/uL (ref 150–400)
RBC: 4.78 MIL/uL (ref 3.87–5.11)
RDW: 13.5 % (ref 11.5–15.5)
WBC: 18.5 K/uL — ABNORMAL HIGH (ref 4.0–10.5)
nRBC: 0 % (ref 0.0–0.2)

## 2023-11-19 LAB — GROUP A STREP BY PCR: Group A Strep by PCR: NOT DETECTED

## 2023-11-19 LAB — TROPONIN T, HIGH SENSITIVITY: Troponin T High Sensitivity: <15 ng/L (ref <19)

## 2023-11-19 LAB — LACTIC ACID, PLASMA: Lactic Acid, Venous: 1 mmol/L (ref 0.5–1.9)

## 2023-11-19 MED ORDER — METRONIDAZOLE 500 MG/100ML IV SOLN
500.0000 mg | Freq: Once | INTRAVENOUS | Status: AC
  Administered 2023-11-19: 500 mg via INTRAVENOUS
  Filled 2023-11-19: qty 100

## 2023-11-19 MED ORDER — LACTATED RINGERS IV SOLN: INTRAVENOUS | Status: DC

## 2023-11-19 MED ORDER — MORPHINE SULFATE (PF) 4 MG/ML IV SOLN
4.0000 mg | Freq: Once | INTRAVENOUS | Status: AC
  Administered 2023-11-19: 4 mg via INTRAVENOUS
  Filled 2023-11-19: qty 1

## 2023-11-19 MED ORDER — LACTATED RINGERS IV BOLUS (SEPSIS)
1000.0000 mL | Freq: Once | INTRAVENOUS | Status: AC
  Administered 2023-11-19: 1000 mL via INTRAVENOUS

## 2023-11-19 MED ORDER — ONDANSETRON HCL 4 MG/2ML IJ SOLN
4.0000 mg | Freq: Once | INTRAMUSCULAR | Status: AC
  Administered 2023-11-19: 4 mg via INTRAVENOUS
  Filled 2023-11-19: qty 2

## 2023-11-19 MED ORDER — LACTATED RINGERS IV BOLUS
1000.0000 mL | Freq: Once | INTRAVENOUS | Status: AC
  Administered 2023-11-19: 1000 mL via INTRAVENOUS

## 2023-11-19 MED ORDER — IOHEXOL 300 MG/ML  SOLN
80.0000 mL | Freq: Once | INTRAMUSCULAR | Status: AC | PRN
  Administered 2023-11-19: 80 mL via INTRAVENOUS

## 2023-11-19 MED ORDER — VANCOMYCIN HCL IN DEXTROSE 1-5 GM/200ML-% IV SOLN
1000.0000 mg | Freq: Once | INTRAVENOUS | Status: AC
  Administered 2023-11-19: 1000 mg via INTRAVENOUS
  Filled 2023-11-19: qty 200

## 2023-11-19 MED ORDER — TRAMADOL HCL 50 MG PO TABS
50.0000 mg | ORAL_TABLET | Freq: Once | ORAL | Status: AC
  Administered 2023-11-19: 50 mg via ORAL
  Filled 2023-11-19: qty 1

## 2023-11-19 MED ORDER — IBUPROFEN 400 MG PO TABS
400.0000 mg | ORAL_TABLET | Freq: Once | ORAL | Status: DC
  Filled 2023-11-19: qty 1

## 2023-11-19 MED ORDER — SODIUM CHLORIDE 0.9 % IV SOLN
2.0000 g | Freq: Once | INTRAVENOUS | Status: AC
  Administered 2023-11-19: 2 g via INTRAVENOUS
  Filled 2023-11-19: qty 12.5

## 2023-11-19 MED ORDER — ACETAMINOPHEN 325 MG PO TABS
650.0000 mg | ORAL_TABLET | Freq: Once | ORAL | Status: AC | PRN
  Administered 2023-11-19: 650 mg via ORAL
  Filled 2023-11-19: qty 2

## 2023-11-19 NOTE — ED Triage Notes (Addendum)
 Patient arrives POV with complaints of dizziness, spasms, chills, and one episode of vomiting x1 day. No sick contacts. No cough or runny nose.

## 2023-11-19 NOTE — ED Notes (Signed)
 Patient transported to X-ray

## 2023-11-19 NOTE — ED Provider Notes (Signed)
 [place] EMERGENCY DEPARTMENT AT [facility] Provider Note   CSN: [id number] Arrival date & time: 11/19/23  1543     Patient presents with: Dizziness and Spasms   Madison [name] is a 48 y.o. female.   Pt with onset chills, body aches, sore throat, yesterday. Indicates felt cold yesterday, body aches, was unaware of fever. Temp in ED 103. Denies specific source of infection or ill contacts. No headache. No neck pain or stiffness. No eye pain or change in vision. No sinus pain or purulent sinus drainage. No trouble breathing or swallowing. No unilateral throat pain or swelling. Mid chest soreness and body soreness. No exertional chest pain. No pleuritic pain. No new or worsening cough. No abd pain or nvd. No dysuria or gu c/o. No vaginal discharge or bleeding. No extremity pain or swelling. No skin lesions/rash.   The history is provided by the patient and medical records.  Dizziness Associated symptoms: no chest pain, no diarrhea, no headaches, no shortness of breath, no vomiting and no weakness        Prior to Admission medications   Medication Sig Start Date End Date Taking? Authorizing Provider  acetaminophen  (TYLENOL ) 500 MG tablet Take 1,000 mg by mouth every 6 (six) hours as needed for mild pain or headache.    [provider]  dicyclomine  (BENTYL ) 20 MG tablet Take 1 tablet (20 mg total) by mouth 2 (two) times daily as needed for spasms. 07/24/20   Caccavale, Sophia, PA-C  HYDROcodone -acetaminophen  (NORCO/VICODIN) 5-325 MG tablet Take 1 tablet by mouth every 6 (six) hours as needed for severe pain. 06/14/20   Dasie Leonor CROME, MD  Multiple Vitamins-Minerals (MULTIVITAMIN WITH MINERALS) tablet Take 1 tablet by mouth daily.    [provider]  ondansetron  (ZOFRAN  ODT) 4 MG disintegrating tablet Take 1 tablet (4 mg total) by mouth every 8 (eight) hours as needed for nausea or vomiting. 07/07/22   Bernis Ernst, PA-C  pantoprazole  (PROTONIX ) 40 MG tablet  Take 1 tablet (40 mg total) by mouth daily. 07/24/20   Caccavale, Sophia, PA-C  promethazine  (PHENERGAN ) 12.5 MG tablet Take 1 tablet (12.5 mg total) by mouth every 6 (six) hours as needed for nausea or vomiting. Patient not taking: No sig reported 01/23/20   Kinsinger, Herlene Righter, MD  sucralfate  (CARAFATE ) 1 GM/10ML suspension Take 10 mLs (1 g total) by mouth 4 (four) times daily -  with meals and at bedtime. 07/07/22   Bernis Ernst, PA-C    Allergies: Oxycodone  and Adhesive [tape]    Review of Systems  Constitutional:  Positive for fever.  HENT:  Negative for sinus pain and trouble swallowing.   Eyes:  Negative for pain, redness and visual disturbance.  Respiratory:  Negative for cough and shortness of breath.   Cardiovascular:  Negative for chest pain and leg swelling.  Gastrointestinal:  Negative for abdominal pain, diarrhea and vomiting.  Genitourinary:  Negative for dysuria, flank pain, vaginal bleeding and vaginal discharge.  Musculoskeletal:  Negative for back pain and neck pain.  Skin:  Negative for rash.  Neurological:  Positive for dizziness and light-headedness. Negative for speech difficulty, weakness, numbness and headaches.    Updated Vital Signs BP 124/78 (BP Location: Right Arm)   Pulse 97   Temp (!) 100.7 F (38.2 C) (Oral)   Resp 18   Ht 1.549 m (5' 1)   Wt 74.8 kg   LMP 01/17/2012   SpO2 95%   BMI 31.18 kg/m   Physical Exam Vitals  and nursing note reviewed.  Constitutional:      Appearance: Normal appearance. She is well-developed.  HENT:     Head: Atraumatic.     Comments: No sinus, temporal or mastoid tenderness.     Right Ear: Tympanic membrane and ear canal normal.     Left Ear: Tympanic membrane and ear canal normal.     Nose: Nose normal.     Mouth/Throat:     Mouth: Mucous membranes are moist.     Pharynx: Oropharynx is clear. No oropharyngeal exudate.  Eyes:     General: No scleral icterus.    Conjunctiva/sclera: Conjunctivae normal.      Pupils: Pupils are equal, round, and reactive to light.  Neck:     Trachea: No tracheal deviation.     Comments: Trachea midline. Thyroid not grossly enlarged or tender. No neck stiffness or rigidity.  Cardiovascular:     Rate and Rhythm: Normal rate and regular rhythm.     Pulses: Normal pulses.     Heart sounds: Normal heart sounds. No murmur heard.    No friction rub. No gallop.  Pulmonary:     Effort: Pulmonary effort is normal. No respiratory distress.     Breath sounds: Normal breath sounds.  Abdominal:     General: Bowel sounds are normal. There is no distension.     Palpations: Abdomen is soft.     Tenderness: There is no abdominal tenderness. There is no guarding.  Genitourinary:    Comments: No cva tenderness.  Musculoskeletal:        General: No swelling or tenderness.     Cervical back: Normal range of motion and neck supple. No rigidity. No muscular tenderness.     Right lower leg: No edema.     Left lower leg: No edema.     Comments: CTLS spine, non tender, aligned, no step off. No focal extremity pain, swelling or tenderness.   Lymphadenopathy:     Cervical: No cervical adenopathy.  Skin:    General: Skin is warm and dry.     Findings: No rash.  Neurological:     Mental Status: She is alert.     Comments: Alert, speech normal. Motor/sens grossly intact bil.   Psychiatric:        Mood and Affect: Mood normal.     (all labs ordered are listed, but only abnormal results are displayed) Results for orders placed or performed during the hospital encounter of 11/19/23  Resp panel by RT-PCR (RSV, Flu A&B, Covid) Anterior Nasal Swab   Collection Time: 11/19/23  4:03 PM   Specimen: Anterior Nasal Swab  Result Value Ref Range   SARS Coronavirus 2 by RT PCR NEGATIVE NEGATIVE   Influenza A by PCR NEGATIVE NEGATIVE   Influenza B by PCR NEGATIVE NEGATIVE   Resp Syncytial Virus by PCR NEGATIVE NEGATIVE  Comprehensive metabolic panel   Collection Time: 11/19/23  4:03 PM   Result Value Ref Range   Sodium 132 (L) 135 - 145 mmol/L   Potassium 4.0 3.5 - 5.1 mmol/L   Chloride 97 (L) 98 - 111 mmol/L   CO2 21 (L) 22 - 32 mmol/L   Glucose, Bld 116 (H) 70 - 99 mg/dL   BUN 11 6 - 20 mg/dL   Creatinine, Ser 8.96 (H) 0.44 - 1.00 mg/dL   Calcium 9.2 8.9 - 89.6 mg/dL   Total Protein 9.1 (H) 6.5 - 8.1 g/dL   Albumin 3.9 3.5 - 5.0 g/dL   AST  28 15 - 41 U/L   ALT 18 0 - 44 U/L   Alkaline Phosphatase 70 38 - 126 U/L   Total Bilirubin 0.3 0.0 - 1.2 mg/dL   GFR, Estimated >39 >39 mL/min   Anion gap 14 5 - 15  CBC   Collection Time: 11/19/23  4:03 PM  Result Value Ref Range   WBC 18.5 (H) 4.0 - 10.5 K/uL   RBC 4.78 3.87 - 5.11 MIL/uL   Hemoglobin 12.7 12.0 - 15.0 g/dL   HCT 60.6 63.9 - 53.9 %   MCV 82.2 80.0 - 100.0 fL   MCH 26.6 26.0 - 34.0 pg   MCHC 32.3 30.0 - 36.0 g/dL   RDW 86.4 88.4 - 84.4 %   Platelets 339 150 - 400 K/uL   nRBC 0.0 0.0 - 0.2 %  Lactic acid, plasma   Collection Time: 11/19/23  4:03 PM  Result Value Ref Range   Lactic Acid, Venous 1.0 0.5 - 1.9 mmol/L  Urinalysis, Routine w reflex microscopic -Urine, Clean Catch   Collection Time: 11/19/23  4:15 PM  Result Value Ref Range   Color, Urine YELLOW YELLOW   APPearance CLEAR CLEAR   Specific Gravity, Urine 1.030 1.005 - 1.030   pH 6.5 5.0 - 8.0   Glucose, UA NEGATIVE NEGATIVE mg/dL   Hgb urine dipstick NEGATIVE NEGATIVE   Bilirubin Urine NEGATIVE NEGATIVE   Ketones, ur TRACE (A) NEGATIVE mg/dL   Protein, ur 30 (A) NEGATIVE mg/dL   Nitrite NEGATIVE NEGATIVE   Leukocytes,Ua NEGATIVE NEGATIVE   RBC / HPF 0-5 0 - 5 RBC/hpf   WBC, UA 0-5 0 - 5 WBC/hpf   Bacteria, UA NONE SEEN NONE SEEN   Squamous Epithelial / HPF 0-5 0 - 5 /HPF   Mucus PRESENT   Group A Strep by PCR   Collection Time: 11/19/23  5:16 PM   Specimen: Sterile Swab  Result Value Ref Range   Group A Strep by PCR NOT DETECTED NOT DETECTED  Troponin T, High Sensitivity   Collection Time: 11/19/23  8:06 PM  Result Value Ref  Range   Troponin T High Sensitivity <15 <19 ng/L   CT CHEST ABDOMEN PELVIS W CONTRAST Result Date: 11/19/2023 CLINICAL DATA:  Sepsis.  Chills.  Episode of vomiting. EXAM: CT CHEST, ABDOMEN, AND PELVIS WITH CONTRAST TECHNIQUE: Multidetector CT imaging of the chest, abdomen and pelvis was performed following the standard protocol during bolus administration of intravenous contrast. RADIATION DOSE REDUCTION: This exam was performed according to the departmental dose-optimization program which includes automated exposure control, adjustment of the mA and/or kV according to patient size and/or use of iterative reconstruction technique. CONTRAST:  80mL OMNIPAQUE  IOHEXOL  300 MG/ML  SOLN COMPARISON:  Abdominopelvic CT 07/07/2022. Chest radiograph 11/19/2022 FINDINGS: CT CHEST FINDINGS Cardiovascular: The heart is normal in size. The thoracic aorta is normal in caliber. No central pulmonary embolus on this nondedicated PE study. No pericardial effusion. Mediastinum/Nodes: Heterogeneous left thyroid nodule measuring 2.4 cm. Unremarkable esophagus. Minimal hiatal hernia no enlarged mediastinal or hilar lymph nodes. Lungs/Pleura: No focal airspace disease. No pleural effusion. No features of pulmonary edema. No pulmonary mass. Trachea and central airways are clear. Musculoskeletal: There are no acute or suspicious osseous abnormalities. CT ABDOMEN PELVIS FINDINGS Hepatobiliary: No focal liver abnormality is seen. Elongated right hepatic lobe is again seen, possible Riedel's lobe configuration versus hepatomegaly. No gallstones, gallbladder wall thickening, or biliary dilatation. Pancreas: No ductal dilatation or inflammation. Spleen: Normal in size without focal abnormality. Adrenals/Urinary Tract:  No adrenal nodule. No hydronephrosis or renal calculi. No evidence of renal inflammation. Unremarkable urinary bladder. Stomach/Bowel: Gastric bypass anatomy. Gastric suture line crosses the diaphragmatic hiatus with small hiatal  hernia. The excluded gastric remnant is nondistended. The Roux limb is nondilated. No small bowel obstruction or evidence of small bowel inflammation. Suspected appendectomy. Small volume of formed stool in the colon. No colonic inflammation. Vascular/Lymphatic: Normal caliber abdominal aorta. Minimal aortic atherosclerosis. The portal vein is patent. No enlarged lymph nodes in the abdomen or pelvis. Reproductive: Status post hysterectomy. No adnexal masses. Other: No free air, free fluid, or intra-abdominal fluid collection. No abdominal wall hernia. Musculoskeletal: L5-S1 degenerative disc disease. There are no acute or suspicious osseous abnormalities. IMPRESSION: 1. No acute abnormality in the chest, abdomen, or pelvis. 2. 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 3. Incidental left thyroid nodule measuring 2.4 cm. Recommend non-emergent thyroid ultrasound. Reference: J Am Coll Radiol. 2015 Feb;12(2): 143-50 Electronically Signed   By: Andrea Gasman M.D.   On: 11/19/2023 19:51   DG Chest 2 View Result Date: 11/19/2023 CLINICAL DATA:  Dizziness EXAM: CHEST - 2 VIEW COMPARISON:  07/25/2019 FINDINGS: The cardiac silhouette, mediastinal and hilar contours are normal. The lungs are clear. No pleural effusions. No pulmonary lesions. The bony thorax is intact. IMPRESSION: No acute cardiopulmonary findings. Electronically Signed   By: MYRTIS Stammer M.D.   On: 11/19/2023 16:50     EKG: EKG Interpretation Date/Time:  Saturday November 19 2023 15:54:40 EDT Ventricular Rate:  115 PR Interval:  189 QRS Duration:  86 QT Interval:  305 QTC Calculation: 422 R Axis:   28  Text Interpretation: Sinus tachycardia Confirmed by Bernard Drivers ([id number]) on 11/19/2023 4:45:28 PM  Radiology: CT CHEST ABDOMEN PELVIS W CONTRAST Result Date: 11/19/2023 CLINICAL DATA:  Sepsis.  Chills.  Episode of vomiting. EXAM: CT CHEST, ABDOMEN, AND PELVIS WITH CONTRAST TECHNIQUE: Multidetector CT imaging of the chest, abdomen and  pelvis was performed following the standard protocol during bolus administration of intravenous contrast. RADIATION DOSE REDUCTION: This exam was performed according to the departmental dose-optimization program which includes automated exposure control, adjustment of the mA and/or kV according to patient size and/or use of iterative reconstruction technique. CONTRAST:  80mL OMNIPAQUE  IOHEXOL  300 MG/ML  SOLN COMPARISON:  Abdominopelvic CT 07/07/2022. Chest radiograph 11/19/2022 FINDINGS: CT CHEST FINDINGS Cardiovascular: The heart is normal in size. The thoracic aorta is normal in caliber. No central pulmonary embolus on this nondedicated PE study. No pericardial effusion. Mediastinum/Nodes: Heterogeneous left thyroid nodule measuring 2.4 cm. Unremarkable esophagus. Minimal hiatal hernia no enlarged mediastinal or hilar lymph nodes. Lungs/Pleura: No focal airspace disease. No pleural effusion. No features of pulmonary edema. No pulmonary mass. Trachea and central airways are clear. Musculoskeletal: There are no acute or suspicious osseous abnormalities. CT ABDOMEN PELVIS FINDINGS Hepatobiliary: No focal liver abnormality is seen. Elongated right hepatic lobe is again seen, possible Riedel's lobe configuration versus hepatomegaly. No gallstones, gallbladder wall thickening, or biliary dilatation. Pancreas: No ductal dilatation or inflammation. Spleen: Normal in size without focal abnormality. Adrenals/Urinary Tract: No adrenal nodule. No hydronephrosis or renal calculi. No evidence of renal inflammation. Unremarkable urinary bladder. Stomach/Bowel: Gastric bypass anatomy. Gastric suture line crosses the diaphragmatic hiatus with small hiatal hernia. The excluded gastric remnant is nondistended. The Roux limb is nondilated. No small bowel obstruction or evidence of small bowel inflammation. Suspected appendectomy. Small volume of formed stool in the colon. No colonic inflammation. Vascular/Lymphatic: Normal caliber  abdominal aorta. Minimal aortic atherosclerosis. The portal  vein is patent. No enlarged lymph nodes in the abdomen or pelvis. Reproductive: Status post hysterectomy. No adnexal masses. Other: No free air, free fluid, or intra-abdominal fluid collection. No abdominal wall hernia. Musculoskeletal: L5-S1 degenerative disc disease. There are no acute or suspicious osseous abnormalities. IMPRESSION: 1. No acute abnormality in the chest, abdomen, or pelvis. 2. 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 3. Incidental left thyroid nodule measuring 2.4 cm. Recommend non-emergent thyroid ultrasound. Reference: J Am Coll Radiol. 2015 Feb;12(2): 143-50 Electronically Signed   By: Andrea Gasman M.D.   On: 11/19/2023 19:51   DG Chest 2 View Result Date: 11/19/2023 CLINICAL DATA:  Dizziness EXAM: CHEST - 2 VIEW COMPARISON:  07/25/2019 FINDINGS: The cardiac silhouette, mediastinal and hilar contours are normal. The lungs are clear. No pleural effusions. No pulmonary lesions. The bony thorax is intact. IMPRESSION: No acute cardiopulmonary findings. Electronically Signed   By: MYRTIS Stammer M.D.   On: 11/19/2023 16:50     Procedures   Medications Ordered in the ED  lactated ringers  infusion ( Intravenous New Bag/Given 11/19/23 2001)  ibuprofen  (ADVIL ) tablet 400 mg (400 mg Oral Not Given 11/19/23 1812)  acetaminophen  (TYLENOL ) tablet 650 mg (650 mg Oral Given 11/19/23 1559)  lactated ringers  bolus 1,000 mL (0 mLs Intravenous Stopped 11/19/23 1939)  ceFEPIme  (MAXIPIME ) 2 g in sodium chloride  0.9 % 100 mL IVPB (0 g Intravenous Stopped 11/19/23 1823)  metroNIDAZOLE  (FLAGYL ) IVPB 500 mg (0 mg Intravenous Stopped 11/19/23 1903)  vancomycin  (VANCOCIN ) IVPB 1000 mg/200 mL premix (0 mg Intravenous Stopped 11/19/23 1901)  lactated ringers  bolus 1,000 mL (0 mLs Intravenous Stopped 11/19/23 1940)  morphine  (PF) 4 MG/ML injection 4 mg (4 mg Intravenous Given 11/19/23 1946)  ondansetron  (ZOFRAN ) injection 4 mg (4 mg Intravenous  Given 11/19/23 1946)  iohexol  (OMNIPAQUE ) 300 MG/ML solution 80 mL (80 mLs Intravenous Contrast Given 11/19/23 1929)                                    Medical Decision Making Problems Addressed: Acute febrile illness: acute illness or injury with systemic symptoms that poses a threat to life or bodily functions Body aches: acute illness or injury Sore throat: acute illness or injury  Amount and/or Complexity of Data Reviewed External Data Reviewed: notes. Labs: ordered. Decision-making details documented in ED Course. Radiology: ordered and independent interpretation performed. Decision-making details documented in ED Course. ECG/medicine tests: ordered and independent interpretation performed. Decision-making details documented in ED Course.  Risk OTC drugs. Prescription drug management. Parenteral controlled substances. Decision regarding hospitalization.   Iv ns. Continuous pulse ox and cardiac monitoring. Labs ordered/sent. Imaging ordered.   Differential diagnosis includes viral syndrome. Covid, strep, pna, etc. Dispo decision including potential need for admission considered - will get labs and imaging and reassess.   Reviewed nursing notes and prior charts for additional history. External reports reviewed.  Cultures sent. Iv abx. Ivf bolus.   Cardiac monitor: sinus rhythm, rate 110.  Labs reviewed/interpreted by me - wbc elev. Ua neg for uti. Covid and flu neg. Strep neg. Lactate normal.    Xrays reviewed/interpreted by me - no pna.   CT reviewed/interpreted by me - no acute process. Pt to f/u re thyroid nodule.   ED workup neg for specific acute cause of fever. Given body aches, sore throat, etc, feel viral syndrome is most likely etiology.   Pt tolerating fluids. On recheck no focal pain  or focal symptoms. Chest cta. Breathing comfortably. No cp. No abd pain or tenderness. No spine pain or tenderness. No neck stiffness/rigidity. No focal extremity pain/swelling. No  rash.   Pt currently I not toxic appearing., normal vitals and appears stable for ED d/c.   Rec close pcp f/u Monday.   Return precautions provided.       Final diagnoses:  Acute febrile illness  Sore throat  Body aches    ED Discharge Orders     None          Bernard Drivers, MD 11/19/23 2146

## 2023-11-19 NOTE — Discharge Instructions (Addendum)
 It was our pleasure to provide your ER care today - we hope that you feel better.  Drink plenty of fluids/stay well hydrated. Take acetaminophen  as need. Follow up closely with primary care doctor this Monday for recheck (may return to urgent care or ED for recheck if not able to be seen by primary care doctor).  Your ct scans made incidental note of:  incidental finding of left thyroid nodule measuring 2.4 cm. Recommend non-emergent thyroid ultrasound. Discuss with primary care doctor and have them arrange an outpatient ultrasound in the next 1-2 months.   Return to ER if worse, new symptoms, new or severe or worsening pain, severe headache, chest pain, trouble breathing, severe throat pain/not able to swallow, severe abdominal pain, neck/back/spine severe pain, focal extremity pain or swelling, or other concern.   You were given pain meds in the ER - no driving for the next 6 hours.

## 2023-11-24 LAB — CULTURE, BLOOD (ROUTINE X 2)
Culture: NO GROWTH
Culture: NO GROWTH
Special Requests: ADEQUATE
